# Patient Record
Sex: Female | Born: 1985
Health system: Southern US, Community
[De-identification: ages and names within clinical notes are randomized; demographics above are authoritative.]

## PROBLEM LIST (undated history)

## (undated) ENCOUNTER — Inpatient Hospital Stay (HOSPITAL_COMMUNITY): Payer: Self-pay

## (undated) DIAGNOSIS — L309 Dermatitis, unspecified: Secondary | ICD-10-CM

## (undated) DIAGNOSIS — M419 Scoliosis, unspecified: Secondary | ICD-10-CM

## (undated) DIAGNOSIS — K589 Irritable bowel syndrome without diarrhea: Secondary | ICD-10-CM

## (undated) DIAGNOSIS — Z91030 Bee allergy status: Secondary | ICD-10-CM

## (undated) DIAGNOSIS — F419 Anxiety disorder, unspecified: Secondary | ICD-10-CM

## (undated) DIAGNOSIS — K219 Gastro-esophageal reflux disease without esophagitis: Secondary | ICD-10-CM

## (undated) DIAGNOSIS — Z148 Genetic carrier of other disease: Secondary | ICD-10-CM

## (undated) DIAGNOSIS — E7522 Gaucher disease: Secondary | ICD-10-CM

## (undated) DIAGNOSIS — R51 Headache: Secondary | ICD-10-CM

## (undated) HISTORY — DX: Gaucher disease: E75.22

## (undated) HISTORY — DX: Genetic carrier of other disease: Z14.8

## (undated) HISTORY — DX: Headache: R51

## (undated) HISTORY — DX: Irritable bowel syndrome, unspecified: K58.9

## (undated) HISTORY — DX: Gastro-esophageal reflux disease without esophagitis: K21.9

## (undated) HISTORY — DX: Bee allergy status: Z91.030

## (undated) HISTORY — PX: WISDOM TOOTH EXTRACTION: SHX21

## (undated) HISTORY — DX: Scoliosis, unspecified: M41.9

## (undated) HISTORY — DX: Anxiety disorder, unspecified: F41.9

## (undated) HISTORY — PX: GUM SURGERY: SHX658

## (undated) HISTORY — DX: Dermatitis, unspecified: L30.9

---
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
## 2005-11-17 ENCOUNTER — Other Ambulatory Visit: Admission: RE | Admit: 2005-11-17 | Discharge: 2005-11-17 | Payer: Self-pay | Admitting: Obstetrics and Gynecology

## 2007-01-05 ENCOUNTER — Other Ambulatory Visit: Admission: RE | Admit: 2007-01-05 | Discharge: 2007-01-05 | Payer: Self-pay | Admitting: Obstetrics and Gynecology

## 2008-02-25 ENCOUNTER — Other Ambulatory Visit: Admission: RE | Admit: 2008-02-25 | Discharge: 2008-02-25 | Payer: Self-pay | Admitting: Obstetrics and Gynecology

## 2008-04-03 ENCOUNTER — Ambulatory Visit: Payer: Self-pay | Admitting: Obstetrics and Gynecology

## 2008-08-28 ENCOUNTER — Ambulatory Visit: Payer: Self-pay | Admitting: Obstetrics and Gynecology

## 2008-09-07 ENCOUNTER — Ambulatory Visit: Payer: Self-pay | Admitting: Obstetrics and Gynecology

## 2009-04-13 ENCOUNTER — Other Ambulatory Visit: Admission: RE | Admit: 2009-04-13 | Discharge: 2009-04-13 | Payer: Self-pay | Admitting: Obstetrics and Gynecology

## 2009-04-13 ENCOUNTER — Encounter: Payer: Self-pay | Admitting: Obstetrics and Gynecology

## 2009-04-13 ENCOUNTER — Ambulatory Visit: Payer: Self-pay | Admitting: Obstetrics and Gynecology

## 2010-03-20 ENCOUNTER — Ambulatory Visit: Payer: Self-pay | Admitting: Obstetrics and Gynecology

## 2010-04-22 ENCOUNTER — Other Ambulatory Visit: Admission: RE | Admit: 2010-04-22 | Discharge: 2010-04-22 | Payer: Self-pay | Admitting: Obstetrics and Gynecology

## 2010-04-22 ENCOUNTER — Ambulatory Visit: Payer: Self-pay | Admitting: Obstetrics and Gynecology

## 2011-01-07 ENCOUNTER — Telehealth: Payer: Self-pay | Admitting: Emergency Medicine

## 2011-01-07 NOTE — Telephone Encounter (Signed)
Pt called, set up New pt, establish care visit for Monday 01/20/11.  She has ppw from employer that needs to be completed.  She would like to have that ppw completed at appt which may require her to have physical done.  Will that be okay for her first visit.  I explained the usual routine of establishing care, then returning for separate physical appointment, but she states that she needs ppw turned in to her job ASAP.  Please advise.  Thanks!

## 2011-01-08 NOTE — Telephone Encounter (Signed)
Ok for pt to have work ppw.

## 2011-01-08 NOTE — Telephone Encounter (Signed)
I explained to pt to bring ppw with her, will complete at visit.

## 2011-01-20 ENCOUNTER — Ambulatory Visit (INDEPENDENT_AMBULATORY_CARE_PROVIDER_SITE_OTHER): Payer: BC Managed Care – PPO | Admitting: Internal Medicine

## 2011-01-20 ENCOUNTER — Encounter: Payer: Self-pay | Admitting: Internal Medicine

## 2011-01-20 VITALS — BP 118/70 | HR 90 | Temp 96.9°F | Resp 12 | Ht 68.5 in | Wt 152.0 lb

## 2011-01-20 DIAGNOSIS — R51 Headache: Secondary | ICD-10-CM

## 2011-01-20 DIAGNOSIS — M4120 Other idiopathic scoliosis, site unspecified: Secondary | ICD-10-CM

## 2011-01-20 DIAGNOSIS — T63461A Toxic effect of venom of wasps, accidental (unintentional), initial encounter: Secondary | ICD-10-CM

## 2011-01-20 DIAGNOSIS — K589 Irritable bowel syndrome without diarrhea: Secondary | ICD-10-CM

## 2011-01-20 DIAGNOSIS — M419 Scoliosis, unspecified: Secondary | ICD-10-CM

## 2011-01-20 DIAGNOSIS — R519 Headache, unspecified: Secondary | ICD-10-CM | POA: Insufficient documentation

## 2011-01-20 DIAGNOSIS — J40 Bronchitis, not specified as acute or chronic: Secondary | ICD-10-CM

## 2011-01-20 DIAGNOSIS — T6391XA Toxic effect of contact with unspecified venomous animal, accidental (unintentional), initial encounter: Secondary | ICD-10-CM

## 2011-01-20 DIAGNOSIS — K219 Gastro-esophageal reflux disease without esophagitis: Secondary | ICD-10-CM | POA: Insufficient documentation

## 2011-01-20 DIAGNOSIS — Z23 Encounter for immunization: Secondary | ICD-10-CM

## 2011-01-20 DIAGNOSIS — Z91030 Bee allergy status: Secondary | ICD-10-CM

## 2011-01-20 MED ORDER — EPINEPHRINE 0.3 MG/0.3ML IJ DEVI: 0.3000 mg | Freq: Once | INTRAMUSCULAR | Status: DC

## 2011-01-20 MED ORDER — AZITHROMYCIN 250 MG PO TABS: ORAL_TABLET | ORAL | Status: DC

## 2011-01-20 MED ORDER — TETANUS-DIPHTH-ACELL PERTUSSIS 5-2.5-18.5 LF-MCG/0.5 IM SUSP: 0.5000 mL | Freq: Once | INTRAMUSCULAR | Status: DC

## 2011-01-20 NOTE — Patient Instructions (Signed)
Take antibiotic as prescribed.   Delsym for cough  Epi pen as directed for bee sting .  Pack in suitcase for travel  Bring in vaccine record

## 2011-01-20 NOTE — Progress Notes (Signed)
Subjective:    Patient ID: Kelly Kelly Navarro, female    DOB: 1985-07-13, 25 y.o.   MRN: [id number]  HPI  New pt first visit.  Former pt.  Kelly Navarro is doing well.  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    She reports frequent headaches that start at base of neck and move upward to back of head.  Sometimes pain is "all over my head" No FH of migraines, she notes some relation to her menses.  Dr. Oletha Blend gave her an extra estrogen patch to wear at times of period.  Assoicated with nausea, no vomiting, no real aura, no visual or speech changes.  No numbness or motor wweakness.  No photohobia,  She takes Alleve and "tries to sleep it off".  Currently happening 1-2 times per month.    Also for the past 4-5 days starting with a severe sore throat, she now has nasal congestion and dry cough.  No fever, no sob, no chest pain  She has a history of been sting allergy but no epi pen.  I spent 45 minutes with this pt  Allergies  Allergen Reactions  . Bee Other (See Comments)    Bee stings- Tongue swelling  . Amoxicillin Hives  . Penicillins Hives  . Sulfa Antibiotics Hives   Past Medical History  Diagnosis Date  . IBS (irritable bowel syndrome)   . Headache   . Bee sting allergy   . Scoliosis     very mild  . GERD (gastroesophageal reflux disease)    History reviewed. No pertinent past surgical history. History   Social History  . Marital Status: Kelly Navarro    Spouse Name: N/A    Number of Children: N/A  . Years of Education: N/A   Occupational History  . Not on file.   Social History Main Topics  . Smoking status: Never Smoker   . Smokeless tobacco: Never Used  . Alcohol Use: 0.5 oz/week    1 drink(s) per week  . Drug Use: No  . Sexually Active: Yes    Birth Control/ Protection: Pill   Other Topics Concern  . Not on file   Social History Narrative  . No narrative on file   Family History  Problem Relation Age of Onset  . Hypertension  Mother   . Diabetes Maternal Grandfather   . Hypertension Maternal Grandfather   . Cancer Paternal Grandmother     breast  . Cancer Paternal Grandfather     multiple myeloma   Patient Active Problem List  Diagnoses  . IBS (irritable bowel syndrome)  . GERD (gastroesophageal reflux disease)  . Headache  . Bee sting allergy  . Scoliosis       Review of Systems  See hpi     Objective:   Physical Exam  Constitutional: She is oriented to person, place, and time. She appears well-developed and well-nourished.  HENT:  Right Ear: A middle ear effusion is present.  Left Ear: A middle ear effusion is present.  Mouth/Throat: Posterior oropharyngeal erythema present.       Serous effusion  Cardiovascular: Regular rhythm, S1 normal and S2 normal.  Exam reveals no gallop and no friction rub.   No murmur heard. Pulmonary/Chest: Breath sounds normal. She has no wheezes. She has no rhonchi. She has no rales.  Musculoskeletal:       Mild scoliosis on exam  Lymphadenopathy:    She has cervical adenopathy.  Right cervical: Superficial cervical adenopathy present.       Left cervical: Superficial cervical adenopathy present.  Neurological: She is alert and oriented to person, place, and time. She has normal strength and normal reflexes. She displays no tremor. No cranial nerve deficit or sensory deficit. Gait normal.  Reflex Scores:      Tricep reflexes are 2+ on the right side and 2+ on the left side.      Bicep reflexes are 2+ on the right side and 2+ on the left side.      Brachioradialis reflexes are 2+ on the right side and 2+ on the left side.      Patellar reflexes are 2+ on the right side and 2+ on the left side.      Achilles reflexes are 2+ on the right side and 2+ on the left side.      Grossly non focal          Assessment & Plan:  1)  Headache:  Has some features of severe tension headaches along with some features of migraines.  Advised to take Ibuprofen 800 mg  at onset of headache.  Keep a diary and will further assess.  She is not interested in Tryptans for migraines at this time.  Neurological exam is nonfocal - I see no worrisome findings on neurological  2)  Bronchitis  willl give Z pack   OTC delsym for cough  3) Mild Scoliosis.  She is asymptomatic  4)  Bee sting allergy   Will RX Epi pen.  Pt instruected in use and to keep near her at all times when out of doors  5)  H/O   IBS  Inactive now.  Dr. Kinnie Scales is GI

## 2011-01-21 ENCOUNTER — Encounter: Payer: Self-pay | Admitting: Internal Medicine

## 2011-01-21 LAB — LIPID PANEL
Cholesterol: 87 mg/dL (ref 0–200)
VLDL: 24 mg/dL (ref 0–40)

## 2011-01-21 LAB — COMPREHENSIVE METABOLIC PANEL
ALT: 11 U/L (ref 0–35)
CO2: 26 mEq/L (ref 19–32)
Calcium: 9.7 mg/dL (ref 8.4–10.5)
Chloride: 103 mEq/L (ref 96–112)
Potassium: 5 mEq/L (ref 3.5–5.3)
Sodium: 138 mEq/L (ref 135–145)
Total Protein: 7.4 g/dL (ref 6.0–8.3)

## 2011-01-21 LAB — CBC WITH DIFFERENTIAL/PLATELET
Lymphocytes Relative: 27 % (ref 12–46)
Lymphs Abs: 2 10*3/uL (ref 0.7–4.0)
Neutrophils Relative %: 67 % (ref 43–77)
Platelets: 221 10*3/uL (ref 150–400)
RBC: 4.35 MIL/uL (ref 3.87–5.11)
WBC: 7.2 10*3/uL (ref 4.0–10.5)

## 2011-01-30 ENCOUNTER — Encounter: Payer: Self-pay | Admitting: Emergency Medicine

## 2011-04-09 ENCOUNTER — Encounter: Payer: Self-pay | Admitting: Internal Medicine

## 2011-04-09 ENCOUNTER — Ambulatory Visit (INDEPENDENT_AMBULATORY_CARE_PROVIDER_SITE_OTHER): Payer: BC Managed Care – PPO | Admitting: Internal Medicine

## 2011-04-09 VITALS — BP 119/78 | HR 70 | Temp 97.0°F | Resp 12 | Ht 68.5 in | Wt 151.0 lb

## 2011-04-09 DIAGNOSIS — L309 Dermatitis, unspecified: Secondary | ICD-10-CM

## 2011-04-09 DIAGNOSIS — L259 Unspecified contact dermatitis, unspecified cause: Secondary | ICD-10-CM

## 2011-04-09 DIAGNOSIS — B86 Scabies: Secondary | ICD-10-CM

## 2011-04-09 MED ORDER — TRIAMCINOLONE ACETONIDE 0.025 % EX OINT: TOPICAL_OINTMENT | Freq: Two times a day (BID) | CUTANEOUS | Status: DC

## 2011-04-09 MED ORDER — PERMETHRIN 5 % EX CREA: TOPICAL_CREAM | Freq: Once | CUTANEOUS | Status: DC

## 2011-04-09 NOTE — Patient Instructions (Signed)
Use creme as directed.  Repeat in  one week  Follow up as needed

## 2011-04-09 NOTE — Progress Notes (Signed)
Addended by: Chip Boer on: 04/09/2011 05:13 PM   Modules accepted: Orders

## 2011-04-09 NOTE — Progress Notes (Signed)
Subjective:    Patient ID: Kelly Navarro, female    DOB: Aug 03, 1985, 25 y.o.   MRN: [id number]  HPI  Kelly Navarro is here for acute visit.  2 weeks of very itchy red papules on feet toes advancing to hip area and lower back.  Itching at night and am.  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  Also was at [facility].  Boyfriend does not have rash.  No new exposures  Also has eczema and needs refill of TAC compound  Allergies  Allergen Reactions  . Bee Other (See Comments)    Bee stings- Tongue swelling  . Amoxicillin Hives  . Penicillins Hives  . Sulfa Antibiotics Hives   Past Medical History  Diagnosis Date  . IBS (irritable bowel syndrome)   . Headache   . Bee sting allergy   . Scoliosis     very mild  . GERD (gastroesophageal reflux disease)    No past surgical history on file. History   Social History  . Marital Status: Navarro    Spouse Name: N/A    Number of Children: N/A  . Years of Education: N/A   Occupational History  . Not on file.   Social History Main Topics  . Smoking status: Never Smoker   . Smokeless tobacco: Never Used  . Alcohol Use: 0.5 oz/week    1 drink(s) per week  . Drug Use: No  . Sexually Active: Yes    Birth Control/ Protection: Pill   Other Topics Concern  . Not on file   Social History Narrative  . No narrative on file   Family History  Problem Relation Age of Onset  . Hypertension Mother   . Diabetes Maternal Grandfather   . Hypertension Maternal Grandfather   . Cancer Paternal Grandmother     breast  . Cancer Paternal Grandfather     multiple myeloma   Patient Active Problem List  Diagnoses  . IBS (irritable bowel syndrome)  . GERD (gastroesophageal reflux disease)  . Headache  . Bee sting allergy  . Scoliosis   Current Outpatient Prescriptions on File Prior to Visit  Medication Sig Dispense Refill  . EPINEPHrine (EPI-PEN) 0.3 mg/0.3 mL DEVI Inject 0.3 mLs (0.3 mg total) into the muscle once.  1  Device  0  . estradiol (VIVELLE-DOT) 0.075 MG/24HR Place 1 patch onto the skin 2 (two) times a week. Week of period       . norethindrone-ethinyl estradiol (JUNEL FE 1/20) 1-20 MG-MCG per tablet Take 1 tablet by mouth daily.         Current Facility-Administered Medications on File Prior to Visit  Medication Dose Route Frequency Provider Last Rate Last Dose  . TDaP (BOOSTRIX) injection 0.5 mL  0.5 mL Intramuscular Once Levon Hedger, MD          Review of Systems    See HPI Objective:   Physical Exam  Physical Exam  Nursing note and vitals reviewed.  Constitutional: She is oriented to person, place, and time. She appears well-developed and well-nourished.  HENT:  Head: Normocephalic and atraumatic.  Cardiovascular: Normal rate and regular rhythm. Exam reveals no gallop and no friction rub.  No murmur heard.  Pulmonary/Chest: Breath sounds normal. She has no wheezes. She has no rales.  Neurological: She is alert and oriented to person, place, and time.  Skin: Skin is warm and dry.  Multiple red papules feet, toes,  With burrowing tracks.  None look infected or purulent Psychiatric:  She has a normal mood and affect. Her behavior is normal.        Assessment & Plan:  1)  Probable scabies  .  Will Tx  with Elimite  creme and repeat in 1 week.  Instructed in how to care for linens 2)  Eczema  Rx for compounded Triamcinolone creme  Follow up prn

## 2011-04-10 ENCOUNTER — Telehealth: Payer: Self-pay | Admitting: Internal Medicine

## 2011-04-10 NOTE — Telephone Encounter (Signed)
Spoke with Kelly Navarro.  She states she used the cream last night, washed all her bed linens and pajamas in hot water this morning.  She states the she is concerned about 2 things.  1- Does she need to treat her carpet, couch, ect?  If so, how?  She had problem for about 3 weeks before she came in.  2- Her boyfriend spent the night with her for the past two weekends.  He does not have any symptoms of infection, but does he need to be treated prophylactically?  He is a Consulting civil engineer at [employer] and uses their student health center for his care.

## 2011-04-10 NOTE — Telephone Encounter (Signed)
Pt aware DDS recommendations

## 2011-04-10 NOTE — Telephone Encounter (Signed)
Does not need to treat any other furniture or carpet.  Boyfriend does not need to be treated prophylactically.  If he developes itchy rash then treat him

## 2011-04-10 NOTE — Telephone Encounter (Signed)
Pt states she have questions about the prescriptions you all prescribe to her yesterday afternoon. She can be reach at [phone]. She is not available between 1230 pm and 130 pm. She prefer to call after three.   Thanks

## 2011-04-16 ENCOUNTER — Encounter: Payer: Self-pay | Admitting: Gynecology

## 2011-04-24 ENCOUNTER — Encounter: Payer: Self-pay | Admitting: Obstetrics and Gynecology

## 2011-04-24 ENCOUNTER — Other Ambulatory Visit (HOSPITAL_COMMUNITY)
Admission: RE | Admit: 2011-04-24 | Discharge: 2011-04-24 | Disposition: A | Discharge status: Home / Self Care | Payer: BC Managed Care – PPO | Source: Ambulatory Visit | Attending: Obstetrics and Gynecology | Admitting: Obstetrics and Gynecology

## 2011-04-24 ENCOUNTER — Telehealth: Payer: Self-pay | Admitting: Emergency Medicine

## 2011-04-24 ENCOUNTER — Ambulatory Visit (INDEPENDENT_AMBULATORY_CARE_PROVIDER_SITE_OTHER): Payer: BC Managed Care – PPO | Admitting: Obstetrics and Gynecology

## 2011-04-24 DIAGNOSIS — R51 Headache: Secondary | ICD-10-CM

## 2011-04-24 DIAGNOSIS — N899 Noninflammatory disorder of vagina, unspecified: Secondary | ICD-10-CM

## 2011-04-24 DIAGNOSIS — B373 Candidiasis of vulva and vagina: Secondary | ICD-10-CM

## 2011-04-24 DIAGNOSIS — G8929 Other chronic pain: Secondary | ICD-10-CM

## 2011-04-24 DIAGNOSIS — L309 Dermatitis, unspecified: Secondary | ICD-10-CM | POA: Insufficient documentation

## 2011-04-24 DIAGNOSIS — N898 Other specified noninflammatory disorders of vagina: Secondary | ICD-10-CM

## 2011-04-24 DIAGNOSIS — Z113 Encounter for screening for infections with a predominantly sexual mode of transmission: Secondary | ICD-10-CM

## 2011-04-24 DIAGNOSIS — Z01419 Encounter for gynecological examination (general) (routine) without abnormal findings: Secondary | ICD-10-CM | POA: Insufficient documentation

## 2011-04-24 MED ORDER — ESTRADIOL 0.075 MG/24HR TD PTTW: 1.0000 | MEDICATED_PATCH | TRANSDERMAL | Status: DC

## 2011-04-24 MED ORDER — TERCONAZOLE 0.8 % VA CREA: 1.0000 | TOPICAL_CREAM | Freq: Every day | VAGINAL | Status: AC

## 2011-04-24 MED ORDER — NORETHIN ACE-ETH ESTRAD-FE 1-20 MG-MCG PO TABS: 1.0000 | ORAL_TABLET | Freq: Every day | ORAL | Status: DC

## 2011-04-24 NOTE — Telephone Encounter (Signed)
Spoke with Kelly Navarro.  She states that she noticed another bug bite over the weekend on her foot, then a couple more on her ankles earlier in the week.  Today when she got home, she has 5 bites on the back of her leg from her knee to her upper thigh.  She is very concerned about a recurrence of scabies.  She only has a small amount of Elimite cream left.  Would like to  Know what to do.  Spoke with DDS.  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  Also, can use what cream she has on bites to see if it helps.  Bites may be fleas or scabies- unable to tell without being seen.  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  She is agreeable to use cream on bites she has, will try to address issues with her principal

## 2011-04-24 NOTE — Progress Notes (Addendum)
Patient came to see me today for an annual GYN exam. She is doing very well in her birth control pills. She takes them cyclically and has monthly periods. She continues to get headaches and sees a PCP for them. They do not relate to her periods but seem to be sporadic although she does get some with her cycle. She continues to use a Vivelle patch on her placebo week and as help significantly with headaches at that time. She has noticed intermittent vaginal irritation after intercourse that responds to terconazole cream. She is currently being treated for scabies.  Physical examination: Kennon Portela present   HEENT within normal limits. Neck: Thyroid not large. No masses. Supraclavicular nodes: not enlarged. Breasts: Examined in both sitting midline position. No skin changes and no masses. Abdomen: Soft no guarding rebound or masses or hernia. Pelvic: External: Within normal limits. BUS: Within normal limits. Vaginal:within normal limits. Good estrogen effect. No evidence of cystocele rectocele or enterocele. Cervix: clean. Uterus: Normal size and shape. Adnexa: No masses. Rectovaginal exam: Confirmatory and negative. Extremities: Within normal limits. Wet prep positive for yeast.  Assessment: #1. Yeast vaginitis #2. Headaches  Plan: For the moment continue birth control pills cyclically. Continue Vivelle patch on placebo week. If PCP cannot help headaches and there seems to be a cluster during her period she will call and we'll either increase her patch strength or go to continuous birth control pills. She was treated with terconazole 3 cream for her yeast infection. Lab work from her PCP. We also discussed oral rephresh two times a week.

## 2011-05-02 ENCOUNTER — Telehealth: Payer: Self-pay

## 2011-05-02 NOTE — Telephone Encounter (Signed)
(  This is Dr. Verl Dicker patient and he if off today.) Patient was to restart pill pack on Sunday and forgot. She remembered on Monday and took pill immediately and later that day at her regular time she took her Monday pill.  She said the insert said no need to use backup birth control but she wanted to check with doctor. Please advise.

## 2011-05-02 NOTE — Telephone Encounter (Signed)
I think it is always safer to use backup contraception if there is a missed pill. Missing the first pill extends her pill free interval which more readily allows for an escape ovulation.

## 2011-05-02 NOTE — Telephone Encounter (Signed)
Patient advised.

## 2011-06-11 ENCOUNTER — Telehealth: Payer: Self-pay | Admitting: *Deleted

## 2011-06-11 NOTE — Telephone Encounter (Signed)
(  last office visit 04/24/11)Pt called wanting to know if her Rx for vivelle-dot patch 0.075 could be written as directed for apply 1 patch twice weekly? DG originally wrote for 1 patch twice a week, week of her period due to headaches. But rx cost more money when written this way. Please advise if rx can be as directed.

## 2011-06-11 NOTE — Telephone Encounter (Signed)
Yes, Vivelle dot 0.075 patch use as directed. Dispense one box of 8 with 6 refills.

## 2011-06-12 MED ORDER — ESTRADIOL 0.075 MG/24HR TD PTTW: 1.0000 | MEDICATED_PATCH | TRANSDERMAL | Status: DC

## 2011-06-12 NOTE — Telephone Encounter (Signed)
Pt informed with the below note. 

## 2011-08-11 ENCOUNTER — Ambulatory Visit (INDEPENDENT_AMBULATORY_CARE_PROVIDER_SITE_OTHER): Payer: BC Managed Care – PPO | Admitting: Family Medicine

## 2011-08-11 VITALS — BP 116/74 | HR 68 | Temp 97.6°F | Resp 16 | Ht 67.5 in | Wt 154.8 lb

## 2011-08-11 DIAGNOSIS — J069 Acute upper respiratory infection, unspecified: Secondary | ICD-10-CM

## 2011-08-11 DIAGNOSIS — R82998 Other abnormal findings in urine: Secondary | ICD-10-CM

## 2011-08-11 MED ORDER — PREDNISONE 20 MG PO TABS: ORAL_TABLET | ORAL | Status: DC

## 2011-08-11 MED ORDER — FLUTICASONE PROPIONATE 50 MCG/ACT NA SUSP: NASAL | Status: DC

## 2011-08-11 NOTE — Progress Notes (Signed)
  Subjective:    Patient ID: Kelly Navarro, female    DOB: 04-Nov-1985, 26 y.o.   MRN: [id number]  HPI patient has been very congested since Friday. It came on quite acutely. He may have had a little fever but not severely ill. Now she's developed severe stuffiness in the he had, with pressure in her frontal and maxillary sinus regions. Trouble with nasal air movement. Not really blowing a lot of or coughing a lot up, it's just socked in the head. Her right ears have a little bit of pain throat only mild pressure. Has a roommate who has not been sick. She did try some DayQuil and a nighttime ago at that. This did not help a lot. She does have mild dizziness when she bends forward and stands back up   Review of Systems unremarkable. Is on birth control and denies pregnancy.     Objective:   Physical Exam Pleasant alert white female no acute distress at this time except for just an uncomfortable with a stuffy head. Her TMs are normal nose congested no nasal polyps were seen she is tender over the frontal and maxillary sinuses throat was clear neck supple without significant nodes chest clear heart regular      Assessment & Plan:  Severe URI  Note the computer marked uricosuria by error and would not remove it  We'll treat symptomatically. If she starts getting a lot of purulent drainage she is to let me know.

## 2011-08-11 NOTE — Patient Instructions (Signed)

## 2011-08-12 ENCOUNTER — Ambulatory Visit: Payer: BC Managed Care – PPO | Admitting: Internal Medicine

## 2011-09-22 ENCOUNTER — Other Ambulatory Visit: Payer: Self-pay | Admitting: Emergency Medicine

## 2011-09-22 MED ORDER — NONFORMULARY OR COMPOUNDED ITEM: Status: DC

## 2011-09-22 NOTE — Telephone Encounter (Signed)
Refill request received from [facility].  I spoke with pharmacy tech for clarification of ingredients of compounded cream.

## 2011-11-06 ENCOUNTER — Telehealth: Payer: Self-pay | Admitting: *Deleted

## 2011-11-06 DIAGNOSIS — R202 Paresthesia of skin: Secondary | ICD-10-CM

## 2011-11-06 NOTE — Telephone Encounter (Signed)
Addended by: Aura Camps on: 11/06/2011 03:50 PM   Modules accepted: Orders

## 2011-11-06 NOTE — Telephone Encounter (Signed)
Pt informed with the below note, pt will drop off u/a tomorrow.

## 2011-11-06 NOTE — Telephone Encounter (Signed)
OK 

## 2011-11-06 NOTE — Telephone Encounter (Signed)
Pt calling c/o of tingling sensation in her lower abdomen area. Pt would like to drop off a u/a tomorrow during her lunch break, no burning, no frequent urination.  Her cycle started on 11/05/11, she states this is the same feeling from last uti. Okay drop off u/a? Please advise

## 2011-11-07 ENCOUNTER — Telehealth: Payer: Self-pay | Admitting: *Deleted

## 2011-11-07 ENCOUNTER — Other Ambulatory Visit: Payer: BC Managed Care – PPO

## 2011-11-07 DIAGNOSIS — R202 Paresthesia of skin: Secondary | ICD-10-CM

## 2011-11-07 MED ORDER — NITROFURANTOIN MONOHYD MACRO 100 MG PO CAPS: 100.0000 mg | ORAL_CAPSULE | Freq: Two times a day (BID) | ORAL | Status: AC

## 2011-11-07 NOTE — Telephone Encounter (Signed)
Addended by: Aura Camps on: 11/07/2011 04:17 PM   Modules accepted: Orders

## 2011-11-07 NOTE — Telephone Encounter (Signed)
Pt informed with the below note, rx sent to pharmacy. 

## 2011-11-07 NOTE — Telephone Encounter (Signed)
Start patient on Macrobid twice a day with food for 5 days. Have her take 2 doses today. She can also buy Azo-Standard over-the-counter and that'll help until the antibiotics start  to work.

## 2011-11-07 NOTE — Telephone Encounter (Signed)
Pt dropped off u/a today, c/o lower abdomen pressure only and would like to know if there was something she could do about the discomfort?No pain with urination nor burning. Urine culture is still pending. Please advise

## 2011-11-08 LAB — URINALYSIS W MICROSCOPIC + REFLEX CULTURE
Leukocytes, UA: NEGATIVE
Protein, ur: NEGATIVE mg/dL
Squamous Epithelial / LPF: NONE SEEN
Urobilinogen, UA: 0.2 mg/dL (ref 0.0–1.0)

## 2011-11-10 ENCOUNTER — Telehealth: Payer: Self-pay | Admitting: *Deleted

## 2011-11-10 NOTE — Telephone Encounter (Signed)
Patient called  me today and told  me that her abdominal pressure has gone. She only took four Macrodantin because she had extreme nausea and sickness and headaches from them. Other than that she is fine. We will wait and see what her culture shows. Assuming that it is normal and her pressure does not return no further treatment. If her pressure returns and cultures negative she will schedule ultrasound.

## 2011-11-10 NOTE — Telephone Encounter (Signed)
Pt was given Macrobid x 5 days on Friday for abdominal pressure, pt said that medication makes her feel sick, nausea, headaches. Pt would like to know if she should wait until culture results comes back or can she have another Rx? Please advise

## 2011-12-01 ENCOUNTER — Other Ambulatory Visit: Payer: Self-pay | Admitting: *Deleted

## 2011-12-02 ENCOUNTER — Other Ambulatory Visit: Payer: Self-pay | Admitting: *Deleted

## 2011-12-02 MED ORDER — TERCONAZOLE 0.8 % VA CREA: 1.0000 | TOPICAL_CREAM | Freq: Every day | VAGINAL | Status: AC

## 2011-12-02 NOTE — Telephone Encounter (Signed)
Addended by: Aura Camps on: 12/02/2011 04:43 PM   Modules accepted: Orders

## 2011-12-02 NOTE — Telephone Encounter (Signed)
okay

## 2011-12-02 NOTE — Telephone Encounter (Signed)
Left message on pt voicemail this has been done. 

## 2011-12-02 NOTE — Telephone Encounter (Signed)
Pt calling requesting refill on terconazole 3 day cream due to yeast infection, pt said you always give her this medication if needed. Okay to fill?

## 2011-12-04 ENCOUNTER — Ambulatory Visit (INDEPENDENT_AMBULATORY_CARE_PROVIDER_SITE_OTHER): Payer: BC Managed Care – PPO | Admitting: Internal Medicine

## 2011-12-04 ENCOUNTER — Encounter: Payer: Self-pay | Admitting: Internal Medicine

## 2011-12-04 VITALS — BP 106/64 | HR 66 | Temp 97.0°F | Resp 16 | Ht 68.0 in | Wt 156.0 lb

## 2011-12-04 DIAGNOSIS — J329 Chronic sinusitis, unspecified: Secondary | ICD-10-CM

## 2011-12-04 DIAGNOSIS — R059 Cough, unspecified: Secondary | ICD-10-CM

## 2011-12-04 DIAGNOSIS — R05 Cough: Secondary | ICD-10-CM

## 2011-12-04 NOTE — Progress Notes (Signed)
Subjective:    Patient ID: Kelly Navarro, female    DOB: 02-01-1986, 26 y.o.   MRN: [id number]  HPI Kelly Navarro is here for acute visit.   She [facility] and was given Z-pak.  1 week later not feeling much better and was then given Levaquin.  Feeling some better but still congested.  Using Flonas nasal spray    No allergy symptoms  No fever,  Cough improved much less now no chest pain  Allergies  Allergen Reactions  . Nutritional Supplements Other (See Comments)    Bee stings- Tongue swelling  . Amoxicillin Hives  . Macrodantin (Nitrofurantoin Macrocrystal)   . Penicillins Hives  . Sulfa Antibiotics Hives   Past Medical History  Diagnosis Date  . IBS (irritable bowel syndrome)   . Headache   . Bee sting allergy   . Scoliosis     very mild  . GERD (gastroesophageal reflux disease)   . Eczema    Past Surgical History  Procedure Date  . Wisdom tooth extraction   . Gum surgery    History   Social History  . Marital Status: Navarro    Spouse Name: N/A    Number of Children: N/A  . Years of Education: N/A   Occupational History  . Not on file.   Social History Main Topics  . Smoking status: Never Smoker   . Smokeless tobacco: Never Used  . Alcohol Use: 0.5 oz/week    1 drink(s) per week  . Drug Use: No  . Sexually Active: Yes    Birth Control/ Protection: Pill   Other Topics Concern  . Not on file   Social History Narrative  . No narrative on file   Family History  Problem Relation Age of Onset  . Hypertension Mother   . Diabetes Maternal Grandfather   . Hypertension Maternal Grandfather   . Breast cancer Paternal Grandmother   . Cancer Paternal Grandfather     multiple myeloma   Patient Active Problem List  Diagnoses  . IBS (irritable bowel syndrome)  . GERD (gastroesophageal reflux disease)  . Headache  . Bee sting allergy  . Scoliosis  . Eczema   Current Outpatient Prescriptions on File Prior to Visit  Medication Sig  Dispense Refill  . EPINEPHrine (EPI-PEN) 0.3 mg/0.3 mL DEVI Inject 0.3 mLs (0.3 mg total) into the muscle once.  1 Device  0  . estradiol (VIVELLE-DOT) 0.075 MG/24HR Place 1 patch onto the skin 2 (two) times a week.  8 patch  6  . fluticasone (FLONASE) 50 MCG/ACT nasal spray Use 2 sprays each nostril twice daily for 3 days, then once daily  16 g  1  . NONFORMULARY OR COMPOUNDED ITEM Triamcinolone 0.1%/Cetaphil lotion 1:2. Apply to affected area as directed  240 each  1  . norethindrone-ethinyl estradiol (JUNEL FE,GILDESS FE,LOESTRIN FE) 1-20 MG-MCG tablet Take 1 tablet by mouth daily.  3 Package  7  . terconazole (TERAZOL 3) 0.8 % vaginal cream Place 1 applicator vaginally at bedtime.  20 g  0   Current Facility-Administered Medications on File Prior to Visit  Medication Dose Route Frequency Provider Last Rate Last Dose  . TDaP (BOOSTRIX) injection 0.5 mL  0.5 mL Intramuscular Once Kendrick Ranch, MD           Review of Systems    see HPI Objective:   Physical Exam Physical Exam  Constitutional: She is oriented to person, place, and time. She  appears well-developed and well-nourished. She is cooperative.  HENT:  Head: Normocephalic and atraumatic.  Right Ear: A middle ear effusion is present.  Left Ear: A middle ear effusion is present.  Nose: Mucosal edema present. Right sinus exhibits maxillary sinus tenderness. Left sinus exhibits maxillary sinus tenderness.  Mouth/Throat: Posterior oropharyngeal erythema present.  Serous effusion bilaterally  Eyes: Conjunctivae and EOM are normal. Pupils are equal, round, and reactive to light.  Neck: Neck supple. Carotid bruit is not present. No mass present.  Cardiovascular: Regular rhythm, normal heart sounds, intact distal pulses and normal pulses. Exam reveals no gallop and no friction rub.  No murmur heard.  Pulmonary/Chest: Breath sounds normal. She has no wheezes. She has no rhonchi. She has no rales.  Neurological: She is alert  and oriented to person, place, and time.  Skin: Skin is warm and dry. No abrasion, no bruising, no ecchymosis and no rash noted. No cyanosis. Nails show no clubbing.  Psychiatric: She has a normal mood and affect. Her speech is normal and behavior is normal.             Assessment & Plan:  Res olving sinusitis   Advised to use Afrin nasal spray next 4 days  Cough improving

## 2011-12-04 NOTE — Patient Instructions (Signed)
Use Afrin as directed.

## 2011-12-24 ENCOUNTER — Other Ambulatory Visit: Payer: Self-pay | Admitting: *Deleted

## 2011-12-24 MED ORDER — TERCONAZOLE 0.8 % VA CREA: 1.0000 | TOPICAL_CREAM | Freq: Every day | VAGINAL | Status: AC

## 2012-02-18 ENCOUNTER — Encounter: Payer: Self-pay | Admitting: Internal Medicine

## 2012-02-18 ENCOUNTER — Ambulatory Visit (INDEPENDENT_AMBULATORY_CARE_PROVIDER_SITE_OTHER): Payer: BC Managed Care – PPO | Admitting: Internal Medicine

## 2012-02-18 VITALS — BP 107/73 | HR 78 | Temp 97.6°F | Resp 16 | Ht 68.0 in | Wt 159.0 lb

## 2012-02-18 DIAGNOSIS — M545 Low back pain, unspecified: Secondary | ICD-10-CM

## 2012-02-18 DIAGNOSIS — M461 Sacroiliitis, not elsewhere classified: Secondary | ICD-10-CM

## 2012-02-18 LAB — POCT URINALYSIS DIPSTICK
Bilirubin, UA: NEGATIVE
Glucose, UA: NEGATIVE
Nitrite, UA: NEGATIVE
Urobilinogen, UA: NEGATIVE

## 2012-02-18 MED ORDER — PREDNISONE 20 MG PO TABS: ORAL_TABLET | ORAL | Status: DC

## 2012-02-18 MED ORDER — CYCLOBENZAPRINE HCL 10 MG PO TABS: ORAL_TABLET | ORAL | Status: DC

## 2012-02-18 NOTE — Progress Notes (Signed)
Subjective:    Patient ID: Kelly Navarro, female    DOB: 04-16-1986, 26 y.o.   MRN: [id number]  HPI Kelly Navarro is here with acute visit.  She has been having R sided sacral pain that radiaties to R hip and groin area.  First began when she ran a 17 K with the [place] running [employer].  She has been actively running 2-3 times a week trying to prepare for a 5 K in October.  No recent injury or trauma.    Has been having headaches near her menstrual cycle.  Associated with photophobia, phonophobia and nausea.  Goes to a dark rooom to "sleep it off"  No FH of migraines  Allergies  Allergen Reactions  . Nutritional Supplements Other (See Comments)    Bee stings- Tongue swelling  . Amoxicillin Hives  . Macrodantin (Nitrofurantoin Macrocrystal)   . Penicillins Hives  . Sulfa Antibiotics Hives   Past Medical History  Diagnosis Date  . IBS (irritable bowel syndrome)   . Headache   . Bee sting allergy   . Scoliosis     very mild  . GERD (gastroesophageal reflux disease)   . Eczema    Past Surgical History  Procedure Date  . Wisdom tooth extraction   . Gum surgery    History   Social History  . Marital Status: Navarro    Spouse Name: N/A    Number of Children: N/A  . Years of Education: N/A   Occupational History  . Not on file.   Social History Main Topics  . Smoking status: Never Smoker   . Smokeless tobacco: Never Used  . Alcohol Use: 0.5 oz/week    1 drink(s) per week  . Drug Use: No  . Sexually Active: Yes    Birth Control/ Protection: Pill   Other Topics Concern  . Not on file   Social History Narrative  . No narrative on file   Family History  Problem Relation Age of Onset  . Hypertension Mother   . Diabetes Maternal Grandfather   . Hypertension Maternal Grandfather   . Breast cancer Paternal Grandmother   . Cancer Paternal Grandfather     multiple myeloma   Patient Active Problem List  Diagnosis  . IBS (irritable bowel syndrome)  . GERD (gastroesophageal reflux  disease)  . Headache  . Bee sting allergy  . Scoliosis  . Eczema  . Sinusitis  . Sacroiliitis   Current Outpatient Prescriptions on File Prior to Visit  Medication Sig Dispense Refill  . EPINEPHrine (EPI-PEN) 0.3 mg/0.3 mL DEVI Inject 0.3 mLs (0.3 mg total) into the muscle once.  1 Device  0  . estradiol (VIVELLE-DOT) 0.075 MG/24HR Place 1 patch onto the skin 2 (two) times a week.  8 patch  6  . fluticasone (FLONASE) 50 MCG/ACT nasal spray Use 2 sprays each nostril twice daily for 3 days, then once daily  16 g  1  . NONFORMULARY OR COMPOUNDED ITEM Triamcinolone 0.1%/Cetaphil lotion 1:2. Apply to affected area as directed  240 each  1  . norethindrone-ethinyl estradiol (JUNEL FE,GILDESS FE,LOESTRIN FE) 1-20 MG-MCG tablet Take 1 tablet by mouth daily.  3 Package  7   Current Facility-Administered Medications on File Prior to Visit  Medication Dose Route Frequency Provider Last Rate Last Dose  . TDaP (BOOSTRIX) injection 0.5 mL  0.5 mL Intramuscular Once Kendrick Ranch, MD           Review of Systems see HPI   Objective:  Physical Exam  Physical Exam  Nursing note and vitals reviewed.  Constitutional: She is oriented to person, place, and time. She appears well-developed and well-nourished.  HENT:  Head: Normocephalic and atraumatic.  Cardiovascular: Normal rate and regular rhythm. Exam reveals no gallop and no friction rub.  No murmur heard.  Pulmonary/Chest: Breath sounds normal. She has no wheezes. She has no rales.  Neurological: She is alert and oriented to person, place, and time.  Skin: Skin is warm and dry.  Psychiatric: She has a normal mood and affect. Her behavior is normal.  Back M/S  Neurologic  SI joint tender to palpation  Pos.    Sign on R  Reflexes 2+ symmetric LE  Motor 5/5 LE  SLR neg bilaterally            Assessment & Plan:  Probable sacroiliitis.  Will give slow taper Prednisone.  40 mg taper q 4 days.  Hold any Nsaid while taking  prednisone.  Flexeril bid prn at hs.    Refer to PT.  See me in 12-14 days.  Stop running for now.  OK for uppper body exercises or to walk.  Headache  May be menstrual migraines.  She does not wish prescribed meds but will discuss next visit. Counseled to keep headache diary next few days.  Proteinuria  See U/A  Prior urine tests neg for protein.  May be related to long distance running.  Will recheck    Return 12-14 days

## 2012-02-18 NOTE — Patient Instructions (Addendum)
See me 12-14 days  No running for now

## 2012-03-08 ENCOUNTER — Telehealth: Payer: Self-pay | Admitting: Internal Medicine

## 2012-03-08 NOTE — Telephone Encounter (Signed)
Pt would like to have the nurse or doctor to call her... Her physical therapy appointment is on Thursday 9/12 with [facility] in [place] in the morning... She wants to know if she should just wait to come in to be seen at one time; it will be for the follow up and results of the initial physical therapy appointment... Please call pt at [phone]... Thanks

## 2012-03-10 ENCOUNTER — Ambulatory Visit: Payer: BC Managed Care – PPO | Admitting: Internal Medicine

## 2012-03-11 ENCOUNTER — Ambulatory Visit: Payer: BC Managed Care – PPO | Attending: Internal Medicine | Admitting: Physical Therapy

## 2012-03-11 DIAGNOSIS — R293 Abnormal posture: Secondary | ICD-10-CM | POA: Insufficient documentation

## 2012-03-11 DIAGNOSIS — IMO0001 Reserved for inherently not codable concepts without codable children: Secondary | ICD-10-CM | POA: Insufficient documentation

## 2012-03-11 DIAGNOSIS — M2550 Pain in unspecified joint: Secondary | ICD-10-CM | POA: Insufficient documentation

## 2012-03-15 ENCOUNTER — Ambulatory Visit: Payer: BC Managed Care – PPO | Admitting: Rehabilitation

## 2012-03-17 ENCOUNTER — Ambulatory Visit: Payer: BC Managed Care – PPO | Admitting: Physical Therapy

## 2012-03-22 ENCOUNTER — Ambulatory Visit: Payer: BC Managed Care – PPO | Admitting: Physical Therapy

## 2012-03-25 ENCOUNTER — Ambulatory Visit: Payer: BC Managed Care – PPO | Admitting: Physical Therapy

## 2012-03-29 ENCOUNTER — Encounter: Payer: BC Managed Care – PPO | Admitting: Physical Therapy

## 2012-03-31 ENCOUNTER — Ambulatory Visit (INDEPENDENT_AMBULATORY_CARE_PROVIDER_SITE_OTHER): Payer: BC Managed Care – PPO | Admitting: Internal Medicine

## 2012-03-31 ENCOUNTER — Ambulatory Visit (HOSPITAL_BASED_OUTPATIENT_CLINIC_OR_DEPARTMENT_OTHER)
Admission: RE | Admit: 2012-03-31 | Discharge: 2012-03-31 | Disposition: A | Discharge status: Home / Self Care | Payer: BC Managed Care – PPO | Source: Ambulatory Visit | Attending: Internal Medicine | Admitting: Internal Medicine

## 2012-03-31 ENCOUNTER — Encounter: Payer: Self-pay | Admitting: Internal Medicine

## 2012-03-31 VITALS — BP 105/69 | HR 80 | Temp 97.4°F | Resp 16 | Wt 157.0 lb

## 2012-03-31 DIAGNOSIS — M25551 Pain in right hip: Secondary | ICD-10-CM

## 2012-03-31 DIAGNOSIS — M25559 Pain in unspecified hip: Secondary | ICD-10-CM | POA: Insufficient documentation

## 2012-03-31 NOTE — Progress Notes (Signed)
Subjective:    Patient ID: Kelly Navarro, female    DOB: Aug 02, 1985, 26 y.o.   MRN: [id number]  HPI [name] is here for follow up.  She states she thinks the prednisone helped and she was on Elliptical yesterday and could tolerate a shot period and has not needed any NSAID.  Still feels some pain in hip off and on.    Did not think PT helped much    Allergies  Allergen Reactions  . Nutritional Supplements Other (See Comments)    Bee stings- Tongue swelling  . Amoxicillin Hives  . Macrodantin (Nitrofurantoin Macrocrystal)   . Penicillins Hives  . Sulfa Antibiotics Hives   Past Medical History  Diagnosis Date  . IBS (irritable bowel syndrome)   . Headache   . Bee sting allergy   . Scoliosis     very mild  . GERD (gastroesophageal reflux disease)   . Eczema    Past Surgical History  Procedure Date  . Wisdom tooth extraction   . Gum surgery    History   Social History  . Marital Status: Navarro    Spouse Name: N/A    Number of Children: N/A  . Years of Education: N/A   Occupational History  . Not on file.   Social History Main Topics  . Smoking status: Never Smoker   . Smokeless tobacco: Never Used  . Alcohol Use: 0.5 oz/week    1 drink(s) per week  . Drug Use: No  . Sexually Active: Yes    Birth Control/ Protection: Pill   Other Topics Concern  . Not on file   Social History Narrative  . No narrative on file   Family History  Problem Relation Age of Onset  . Hypertension Mother   . Diabetes Maternal Grandfather   . Hypertension Maternal Grandfather   . Breast cancer Paternal Grandmother   . Cancer Paternal Grandfather     multiple myeloma   Patient Active Problem List  Diagnosis  . IBS (irritable bowel syndrome)  . GERD (gastroesophageal reflux disease)  . Headache  . Bee sting allergy  . Scoliosis  . Eczema  . Sinusitis  . Sacroiliitis   Current Outpatient Prescriptions on File Prior to Visit  Medication Sig Dispense Refill  . ibuprofen  (ADVIL,MOTRIN) 200 MG tablet Take 200 mg by mouth as needed.      . NONFORMULARY OR COMPOUNDED ITEM Triamcinolone 0.1%/Cetaphil lotion 1:2. Apply to affected area as directed  240 each  1  . norethindrone-ethinyl estradiol (JUNEL FE,GILDESS FE,LOESTRIN FE) 1-20 MG-MCG tablet Take 1 tablet by mouth daily.  3 Package  7  . cyclobenzaprine (FLEXERIL) 10 MG tablet Take one tablet twice a day as needed for muscle spasm  6 tablet  0  . EPINEPHrine (EPI-PEN) 0.3 mg/0.3 mL DEVI Inject 0.3 mLs (0.3 mg total) into the muscle once.  1 Device  0  . estradiol (VIVELLE-DOT) 0.075 MG/24HR Place 1 patch onto the skin 2 (two) times a week.  8 patch  6  . fluticasone (FLONASE) 50 MCG/ACT nasal spray Use 2 sprays each nostril twice daily for 3 days, then once daily  16 g  1   Current Facility-Administered Medications on File Prior to Visit  Medication Dose Route Frequency Provider Last Rate Last Dose  . TDaP (BOOSTRIX) injection 0.5 mL  0.5 mL Intramuscular Once Kendrick Ranch, MD           Review of Systems    see HPI Objective:  Physical Exam Physical Exam  Nursing note and vitals reviewed.  Constitutional: She is oriented to person, place, and time. She appears well-developed and well-nourished.  HENT:  Head: Normocephalic and atraumatic.  Cardiovascular: Normal rate and regular rhythm. Exam reveals no gallop and no friction rub.  No murmur heard.  Pulmonary/Chest: Breath sounds normal. She has no wheezes. She has no rales.  Neurological: She is alert and oriented to person, place, and time.  Skin: Skin is warm and dry.  Psychiatric: She has a normal mood and affect. Her behavior is normal.  M/S  Good ROm  R hip no pain today  Minor pain SI joint             Assessment & Plan:  R hip pain  Will get xray today  OK to increase exercise slowly  If not betteer will refer to [facility]

## 2012-03-31 NOTE — Patient Instructions (Signed)
To Xray today  Call me tommorrow

## 2012-04-01 ENCOUNTER — Encounter: Payer: BC Managed Care – PPO | Admitting: Physical Therapy

## 2012-04-06 ENCOUNTER — Encounter: Payer: BC Managed Care – PPO | Admitting: Physical Therapy

## 2012-04-08 ENCOUNTER — Encounter: Payer: BC Managed Care – PPO | Admitting: Physical Therapy

## 2012-04-13 ENCOUNTER — Encounter: Payer: BC Managed Care – PPO | Admitting: Physical Therapy

## 2012-04-15 ENCOUNTER — Encounter: Payer: BC Managed Care – PPO | Admitting: Physical Therapy

## 2012-04-20 ENCOUNTER — Encounter: Payer: BC Managed Care – PPO | Admitting: Physical Therapy

## 2012-04-22 ENCOUNTER — Encounter: Payer: BC Managed Care – PPO | Admitting: Physical Therapy

## 2012-04-27 ENCOUNTER — Encounter: Payer: Self-pay | Admitting: Obstetrics and Gynecology

## 2012-04-27 ENCOUNTER — Ambulatory Visit (INDEPENDENT_AMBULATORY_CARE_PROVIDER_SITE_OTHER): Payer: BC Managed Care – PPO | Admitting: Obstetrics and Gynecology

## 2012-04-27 VITALS — BP 112/64 | Ht 68.0 in | Wt 158.0 lb

## 2012-04-27 DIAGNOSIS — Z23 Encounter for immunization: Secondary | ICD-10-CM

## 2012-04-27 DIAGNOSIS — L293 Anogenital pruritus, unspecified: Secondary | ICD-10-CM

## 2012-04-27 DIAGNOSIS — N898 Other specified noninflammatory disorders of vagina: Secondary | ICD-10-CM

## 2012-04-27 DIAGNOSIS — Z01419 Encounter for gynecological examination (general) (routine) without abnormal findings: Secondary | ICD-10-CM

## 2012-04-27 MED ORDER — NORETHIN ACE-ETH ESTRAD-FE 1-20 MG-MCG PO TABS: 1.0000 | ORAL_TABLET | Freq: Every day | ORAL | Status: DC

## 2012-04-27 MED ORDER — TERCONAZOLE 0.8 % VA CREA: 1.0000 | TOPICAL_CREAM | Freq: Every day | VAGINAL | Status: DC

## 2012-04-27 NOTE — Progress Notes (Signed)
Patient came to see me today for her annual GYN exam. She remains on oral contraceptives for birth control. She does well on them. She does get migraine headaches. For a while we used an estrogen patch on her pill free week. She did not think it made a difference. She does say however that a lot of the headaches are not that week. She is keeping a diary of the headaches for her PCP. She does her lab from her PCP. She has never had an abnormal Pap smear. Her last Pap smear was 2012. She is living with her significant other. She was tested for [facility] and Chlamydia last year and she is still with him. She declined STD testing.  Physical examination:Kim Julian Reil present. HEENT within normal limits. Neck: Thyroid not large. No masses. Supraclavicular nodes: not enlarged. Breasts: Examined in both sitting and lying  position. No skin changes and no masses. Abdomen: Soft no guarding rebound or masses or hernia. Pelvic: External: Within normal limits. BUS: Within normal limits. Vaginal:within normal limits. Good estrogen effect. No evidence of cystocele rectocele or enterocele. Cervix: clean. Uterus: Normal size and shape. Adnexa: No masses. Rectovaginal exam: Confirmatory and negative. Extremities: Within normal limits.  Assessment: Normal GYN exam  Plan: Continue oral contraceptives. Pap not done.The new Pap smear guidelines were discussed with the patient.

## 2012-04-27 NOTE — Patient Instructions (Addendum)
Continue oral contraceptives.

## 2012-04-28 LAB — URINALYSIS W MICROSCOPIC + REFLEX CULTURE
Nitrite: NEGATIVE
Protein, ur: NEGATIVE mg/dL
Specific Gravity, Urine: 1.008 (ref 1.005–1.030)
Squamous Epithelial / LPF: NONE SEEN
Urobilinogen, UA: 0.2 mg/dL (ref 0.0–1.0)

## 2012-04-29 ENCOUNTER — Encounter: Payer: BC Managed Care – PPO | Admitting: Obstetrics and Gynecology

## 2012-04-29 LAB — URINE CULTURE

## 2012-05-24 ENCOUNTER — Other Ambulatory Visit: Payer: Self-pay | Admitting: *Deleted

## 2012-05-24 MED ORDER — NONFORMULARY OR COMPOUNDED ITEM: Status: DC

## 2012-05-24 NOTE — Telephone Encounter (Signed)
Needs refill

## 2012-07-01 ENCOUNTER — Encounter: Payer: Self-pay | Admitting: Internal Medicine

## 2012-07-01 ENCOUNTER — Ambulatory Visit (INDEPENDENT_AMBULATORY_CARE_PROVIDER_SITE_OTHER): Payer: BC Managed Care – PPO | Admitting: Internal Medicine

## 2012-07-01 VITALS — BP 104/68 | HR 57 | Temp 96.8°F | Resp 16 | Wt 155.0 lb

## 2012-07-01 DIAGNOSIS — R635 Abnormal weight gain: Secondary | ICD-10-CM

## 2012-07-01 DIAGNOSIS — R51 Headache: Secondary | ICD-10-CM

## 2012-07-01 MED ORDER — SUMATRIPTAN SUCCINATE 50 MG PO TABS: ORAL_TABLET | ORAL | Status: DC

## 2012-07-01 NOTE — Progress Notes (Signed)
Subjective:    Patient ID: Kelly Navarro, female    DOB: 17-Nov-1985, 27 y.o.   MRN: [id number]  HPI [name] is here for acute visit.  She has been trying to lose weight and is going to weight watchers.  She was wondering if her birth control pill was contributing to weight gain.  She denies swelling in fingers or lower extremities.    She also continues to have headache.  Occurs perimenstrually and sometimes in mid month.  Has phonophobia  , no photophobia A/w nausea no vomiting.  No visual changes.  No FH of migraine.  No numbness or paresthesias   Allergies  Allergen Reactions  . Nutritional Supplements Other (See Comments)    Bee stings- Tongue swelling  . Amoxicillin Hives  . Macrodantin (Nitrofurantoin Macrocrystal) Nausea And Vomiting  . Penicillins Hives  . Sulfa Antibiotics Hives   Past Medical History  Diagnosis Date  . IBS (irritable bowel syndrome)   . Headache   . Bee sting allergy   . Scoliosis     very mild  . GERD (gastroesophageal reflux disease)   . Eczema    Past Surgical History  Procedure Date  . Wisdom tooth extraction   . Gum surgery    History   Social History  . Marital Status: Navarro    Spouse Name: N/A    Number of Children: N/A  . Years of Education: N/A   Occupational History  . Not on file.   Social History Main Topics  . Smoking status: Never Smoker   . Smokeless tobacco: Never Used  . Alcohol Use: 0.5 oz/week    1 drink(s) per week  . Drug Use: No  . Sexually Active: Yes    Birth Control/ Protection: Pill   Other Topics Concern  . Not on file   Social History Narrative  . No narrative on file   Family History  Problem Relation Age of Onset  . Hypertension Mother   . Diabetes Maternal Grandfather   . Hypertension Maternal Grandfather   . Breast cancer Paternal Grandmother     Age 19  . Cancer Paternal Grandfather     multiple myeloma   Patient Active Problem List  Diagnosis  . IBS (irritable bowel syndrome)  . GERD  (gastroesophageal reflux disease)  . Headache  . Bee sting allergy  . Scoliosis  . Eczema  . Sinusitis  . Sacroiliitis   Current Outpatient Prescriptions on File Prior to Visit  Medication Sig Dispense Refill  . fluticasone (FLONASE) 50 MCG/ACT nasal spray Use 2 sprays each nostril twice daily for 3 days, then once daily  16 g  1  . ibuprofen (ADVIL,MOTRIN) 200 MG tablet Take 200 mg by mouth as needed.      . NONFORMULARY OR COMPOUNDED ITEM Triamcinolone 0.1%/Cetaphil lotion 1:2. Apply to affected area as directed  240 each  1  . norethindrone-ethinyl estradiol (JUNEL FE,GILDESS FE,LOESTRIN FE) 1-20 MG-MCG tablet Take 1 tablet by mouth daily.  3 Package  6  . EPINEPHrine (EPI-PEN) 0.3 mg/0.3 mL DEVI Inject 0.3 mLs (0.3 mg total) into the muscle once.  1 Device  0  . estradiol (VIVELLE-DOT) 0.075 MG/24HR Place 1 patch onto the skin 2 (two) times a week.  8 patch  6  . terconazole (TERAZOL 3) 0.8 % vaginal cream Place 1 applicator vaginally at bedtime.  20 g  8   Current Facility-Administered Medications on File Prior to Visit  Medication Dose Route Frequency Provider Last Rate Last Dose  .  TDaP (BOOSTRIX) injection 0.5 mL  0.5 mL Intramuscular Once Kendrick Ranch, MD           Review of Systems See  HPI    Objective:   Physical Exam  Physical Exam  Nursing note and vitals reviewed.  Constitutional: She is oriented to person, place, and time. She appears well-developed and well-nourished.  HENT:  Head: Normocephalic and atraumatic.  Cardiovascular: Normal rate and regular rhythm. Exam reveals no gallop and no friction rub.  No murmur heard.  Pulmonary/Chest: Breath sounds normal. She has no wheezes. She has no rales.  Neurological: She is alert and oriented to person, place, and time.  Skin: Skin is warm and dry.  Psychiatric: She has a normal mood and affect. Her behavior is normal.        Assessment & Plan:  Headache: clinically seems consistent with migraine  syndrome.  Will try Imitrex 50 mg at onset of headache and may repeat in 2hours one time only  Weight gain  Ok to continue current OC  See me as needed

## 2012-09-15 ENCOUNTER — Other Ambulatory Visit: Payer: Self-pay | Admitting: *Deleted

## 2012-09-15 MED ORDER — NONFORMULARY OR COMPOUNDED ITEM: Status: DC

## 2012-09-15 NOTE — Telephone Encounter (Signed)
Refill request

## 2012-09-29 ENCOUNTER — Telehealth: Payer: Self-pay | Admitting: *Deleted

## 2012-09-29 ENCOUNTER — Ambulatory Visit: Payer: BC Managed Care – PPO | Admitting: Internal Medicine

## 2012-09-29 NOTE — Telephone Encounter (Signed)
Pt calls with a C/O SOB productive cough and fever requesting earliest appt available for today made an appt for 430 this Pm pt request that we call if a cancellation opens up advised pt that if her sx worsens before her appt to go to UC. Pt upset that I suggested this states that is why I have a doctor. Explained to pt that we do not want her illness to worsen and that it is in her best interest to seek medical attention if she worsens. Pt requested that I call Dr Constance Goltz to see if she can make an appt for her because she knows her. Explained that we will call if an earlier appt opens up.

## 2012-10-11 ENCOUNTER — Ambulatory Visit: Payer: BC Managed Care – PPO | Admitting: Internal Medicine

## 2012-10-19 ENCOUNTER — Telehealth: Payer: Self-pay | Admitting: Internal Medicine

## 2012-10-19 NOTE — Telephone Encounter (Signed)
Pt called stating prescribed Z pack for sinus infection.  Took Z pack prescribed 2 weeks ago.  Would like to know if she needs to take Z pack prescribed?

## 2012-11-04 ENCOUNTER — Ambulatory Visit (INDEPENDENT_AMBULATORY_CARE_PROVIDER_SITE_OTHER): Payer: BC Managed Care – PPO | Admitting: Women's Health

## 2012-11-04 ENCOUNTER — Encounter: Payer: Self-pay | Admitting: Women's Health

## 2012-11-04 DIAGNOSIS — R3915 Urgency of urination: Secondary | ICD-10-CM

## 2012-11-04 DIAGNOSIS — R5381 Other malaise: Secondary | ICD-10-CM

## 2012-11-04 DIAGNOSIS — R5383 Other fatigue: Secondary | ICD-10-CM

## 2012-11-04 LAB — URINALYSIS W MICROSCOPIC + REFLEX CULTURE
Bilirubin Urine: NEGATIVE
Crystals: NONE SEEN
Ketones, ur: NEGATIVE mg/dL
Nitrite: NEGATIVE
Protein, ur: NEGATIVE mg/dL
Specific Gravity, Urine: 1.01 (ref 1.005–1.030)
Urobilinogen, UA: 0.2 mg/dL (ref 0.0–1.0)

## 2012-11-04 LAB — CBC WITH DIFFERENTIAL/PLATELET
HCT: 39.4 % (ref 36.0–46.0)
Hemoglobin: 13 g/dL (ref 12.0–15.0)
Lymphs Abs: 2.1 10*3/uL (ref 0.7–4.0)
Monocytes Absolute: 0.4 10*3/uL (ref 0.1–1.0)
Monocytes Relative: 5 % (ref 3–12)
Neutro Abs: 4.1 10*3/uL (ref 1.7–7.7)
Neutrophils Relative %: 61 % (ref 43–77)
RBC: 4.4 MIL/uL (ref 3.87–5.11)

## 2012-11-04 NOTE — Progress Notes (Signed)
Patient ID: Kelly Navarro, female   DOB: Dec 15, 1985, 27 y.o.   MRN: [id number] Presents with several complaints. States has been having some urinary frequency with urgency, denies pain at end of stream of urination. Currently on her menstrual cycle. On Loestrin 1/20 without missed pills. Same partner for years. Also states has had some dizziness with nausea without vomiting. States has just not been feeling well for the last couple of days. 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 Also training for half marathon, ran a 10 K last week. Has lost 10 pounds in the last 4 months with diet and exercise. Denies any vaginal discharge or fever.  Exam: Appears well, BP 112/70, UA trace blood, RBCs 0 - 2.   Questionable viral infection Questionable dehydration from exercise  Plan: Urine culture pending, CBC, and increase fluids and rest. Instructed to go home from work today. Instructed to call if no relief of symptoms in 2 days.

## 2012-11-06 LAB — URINE CULTURE
Colony Count: NO GROWTH
Organism ID, Bacteria: NO GROWTH

## 2013-04-08 ENCOUNTER — Ambulatory Visit (INDEPENDENT_AMBULATORY_CARE_PROVIDER_SITE_OTHER): Payer: BC Managed Care – PPO | Admitting: Family Medicine

## 2013-04-08 VITALS — BP 102/74 | HR 71 | Temp 98.3°F | Resp 16 | Ht 68.0 in | Wt 152.8 lb

## 2013-04-08 DIAGNOSIS — R197 Diarrhea, unspecified: Secondary | ICD-10-CM

## 2013-04-08 DIAGNOSIS — R059 Cough, unspecified: Secondary | ICD-10-CM

## 2013-04-08 DIAGNOSIS — R05 Cough: Secondary | ICD-10-CM

## 2013-04-08 DIAGNOSIS — B349 Viral infection, unspecified: Secondary | ICD-10-CM

## 2013-04-08 DIAGNOSIS — B9789 Other viral agents as the cause of diseases classified elsewhere: Secondary | ICD-10-CM

## 2013-04-08 MED ORDER — AZITHROMYCIN 250 MG PO TABS: ORAL_TABLET | ORAL | Status: DC

## 2013-04-08 MED ORDER — BENZONATATE 100 MG PO CAPS: 100.0000 mg | ORAL_CAPSULE | Freq: Three times a day (TID) | ORAL | Status: DC | PRN

## 2013-04-08 NOTE — Progress Notes (Addendum)
Subjective:  This chart was scribed for Shade Flood, MD by Leone Payor, ED Scribe. This patient was seen in room Room 1 and the patient's care was started 11:51 AM.   Patient ID: Kelly Navarro, female    DOB: 03/31/1986, 27 y.o.   MRN: [id number]  HPI  HPI Comments: Kelly Navarro is a 27 y.o. female who presents to [facility] complaining of a gradual onset, constant, gradually worsening cough that began 6 days ago. Pt states she initially had some rhinorrhea as well. She reports having her flu vaccine 3 days ago. She reports feeling increased fatigue 2 days ago and noticed the cough worsened and became productive of dark green sputum. She also began to notice associated chest congestion and SOB with exertion as well. She reports having 1 episode of diarrhea two days ago and again this morning. She states having slight decreased appetite this week. She has been taking delsym at night and Claritin with no relief. Pt states she works as a Engineer, site with sick contacts. She denies fever, abdominal pain, emesis.    Past Medical History  Diagnosis Date  . IBS (irritable bowel syndrome)   . Headache(784.0)   . Bee sting allergy   . Scoliosis     very mild  . GERD (gastroesophageal reflux disease)   . Eczema    Past Surgical History  Procedure Laterality Date  . Wisdom tooth extraction    . Gum surgery     Allergies  Allergen Reactions  . Nutritional Supplements Other (See Comments)    Bee stings- Tongue swelling  . Amoxicillin Hives  . Macrodantin [Nitrofurantoin Macrocrystal] Nausea And Vomiting  . Penicillins Hives  . Sulfa Antibiotics Hives   Prior to Admission medications   Medication Sig Start Date End Date Taking? Authorizing Provider  EPINEPHrine (EPI-PEN) 0.3 mg/0.3 mL DEVI Inject 0.3 mLs (0.3 mg total) into the muscle once. 01/20/11  Yes Kendrick Ranch, MD  ibuprofen (ADVIL,MOTRIN) 200 MG tablet Take 200 mg by mouth as needed.   Yes Historical Provider, MD  NONFORMULARY  OR COMPOUNDED ITEM Triamcinolone 0.1%/Cetaphil lotion 1:2. Apply to affected area as directed 09/15/12  Yes Kendrick Ranch, MD  norethindrone-ethinyl estradiol (JUNEL FE,GILDESS FE,LOESTRIN FE) 1-20 MG-MCG tablet Take 1 tablet by mouth daily. 04/27/12  Yes Trellis Paganini, MD  terconazole (TERAZOL 3) 0.8 % vaginal cream Place 1 applicator vaginally at bedtime. 04/27/12  Yes Trellis Paganini, MD  estradiol (VIVELLE-DOT) 0.075 MG/24HR Place 1 patch onto the skin 2 (two) times a week. 06/12/11 06/11/12  Harrington Challenger, NP  fluticasone Aleda Grana) 50 MCG/ACT nasal spray Use 2 sprays each nostril twice daily for 3 days, then once daily 08/11/11   Peyton Najjar, MD  SUMAtriptan (IMITREX) 50 MG tablet Take one tablet at onset of headache.  May repeat one time only  in 2 hours 07/01/12   Kendrick Ranch, MD   History   Social History  . Marital Status: Navarro    Spouse Name: N/A    Number of Children: N/A  . Years of Education: N/A   Occupational History  . Not on file.   Social History Main Topics  . Smoking status: Never Smoker   . Smokeless tobacco: Never Used  . Alcohol Use: 0.5 oz/week    1 drink(s) per week  . Drug Use: No  . Sexual Activity: Yes    Birth Control/ Protection: Pill   Other Topics Concern  . Not on file  Social History Narrative  . No narrative on file      Review of Systems  Constitutional: Positive for fatigue. Negative for fever.  Respiratory: Positive for cough (productive of green sputum) and shortness of breath (with exertion).   Gastrointestinal: Positive for diarrhea. Negative for nausea, vomiting and abdominal pain.       Objective:   Physical Exam  Nursing note and vitals reviewed. Constitutional: She is oriented to person, place, and time. She appears well-developed and well-nourished. No distress.  HENT:  Head: Normocephalic and atraumatic.  Right Ear: Hearing, tympanic membrane, external ear and ear canal normal.  Left Ear:  Hearing, tympanic membrane, external ear and ear canal normal.  Nose: Nose normal.  Mouth/Throat: Oropharynx is clear and moist. No oropharyngeal exudate.  Eyes: Conjunctivae and EOM are normal. Pupils are equal, round, and reactive to light.  Cardiovascular: Normal rate, regular rhythm, normal heart sounds and intact distal pulses.   No murmur heard. Pulmonary/Chest: Effort normal and breath sounds normal. No respiratory distress. She has no wheezes. She has no rhonchi.  Abdominal: Soft. Bowel sounds are normal. She exhibits no distension. There is no tenderness. There is no rebound and no guarding.  Neurological: She is alert and oriented to person, place, and time.  Skin: Skin is warm and dry. No rash noted.  Psychiatric: She has a normal mood and affect. Her behavior is normal.        Assessment & Plan:  Kelly Navarro is a 27 y.o. female Cough - Plan: benzonatate (TESSALON PERLES) 100 MG capsule, azithromycin (ZITHROMAX) 250 MG tablet  Viral illness  Diarrhea   Suspected viral uri/cough, with few episodes of diarrhea. Reassuring exam, lungs clear, pulse ox wnl.  Continued sx care with mucinex, fluids, rest at present, rx tessalon if needed (can call if stronger suppressant needed at night).  If cough not improved in 3-4 days, can start Zpak, but rtc if fever, or other worsening as deferred CBC and CXR today.   Meds ordered this encounter  Medications  . benzonatate (TESSALON PERLES) 100 MG capsule    Sig: Take 1 capsule (100 mg total) by mouth 3 (three) times daily as needed for cough.    Dispense:  20 capsule    Refill:  0  . azithromycin (ZITHROMAX) 250 MG tablet    Sig: Take 2 pills by mouth on day 1, then 1 pill by mouth per day on days 2 through 5.    Dispense:  6 each    Refill:  0   Patient Instructions  Start mucinex or mucinex DM for cough during the day or Tessalon perles. Drink plenty of fluids. Rest as able.  If cough not improving in next 3-4 days, can start  antibiotic, but if fever, more shortness of breath, or worsening - return for recheck . Viral Infections A virus is a type of germ. Viruses can cause:  Minor sore throats.  Aches and pains.  Headaches.  Runny nose.  Rashes.  Watery eyes.  Tiredness.  Coughs.  Loss of appetite.  Feeling sick to your stomach (nausea).  Throwing up (vomiting).  Watery poop (diarrhea). HOME CARE   Only take medicines as told by your doctor.  Drink enough water and fluids to keep your pee (urine) clear or pale yellow. Sports drinks are a good choice.  Get plenty of rest and eat healthy. Soups and broths with crackers or rice are fine. GET HELP RIGHT AWAY IF:   You have a very bad  headache.  You have shortness of breath.  You have chest pain or neck pain.  You have an unusual rash.  You cannot stop throwing up.  You have watery poop that does not stop.  You cannot keep fluids down.  You or your child has a temperature by mouth above 102 F (38.9 C), not controlled by medicine.  Your baby is older than 3 months with a rectal temperature of 102 F (38.9 C) or higher.  Your baby is 84 months old or younger with a rectal temperature of 100.4 F (38 C) or higher. MAKE SURE YOU:   Understand these instructions.  Will watch this condition.  Will get help right away if you are not doing well or get worse. Document Released: 05/29/2008 Document Revised: 09/08/2011 Document Reviewed: 10/22/2010 [facility] Patient Information 2014 [place], [place].   Cough, Adult  A cough is a reflex that helps clear your throat and airways. It can help heal the body or may be a reaction to an irritated airway. A cough may only last 2 or 3 weeks (acute) or may last more than 8 weeks (chronic).  CAUSES Acute cough:  Viral or bacterial infections. Chronic cough:  Infections.  Allergies.  Asthma.  Post-nasal drip.  Smoking.  Heartburn or acid reflux.  Some medicines.  Chronic lung  problems (COPD).  Cancer. SYMPTOMS   Cough.  Fever.  Chest pain.  Increased breathing rate.  High-pitched whistling sound when breathing (wheezing).  Colored mucus that you cough up (sputum). TREATMENT   A bacterial cough may be treated with antibiotic medicine.  A viral cough must run its course and will not respond to antibiotics.  Your caregiver may recommend other treatments if you have a chronic cough. HOME CARE INSTRUCTIONS   Only take over-the-counter or prescription medicines for pain, discomfort, or fever as directed by your caregiver. Use cough suppressants only as directed by your caregiver.  Use a cold steam vaporizer or humidifier in your bedroom or home to help loosen secretions.  Sleep in a semi-upright position if your cough is worse at night.  Rest as needed.  Stop smoking if you smoke. SEEK IMMEDIATE MEDICAL CARE IF:   You have pus in your sputum.  Your cough starts to worsen.  You cannot control your cough with suppressants and are losing sleep.  You begin coughing up blood.  You have difficulty breathing.  You develop pain which is getting worse or is uncontrolled with medicine.  You have a fever. MAKE SURE YOU:   Understand these instructions.  Will watch your condition.  Will get help right away if you are not doing well or get worse. Document Released: 12/13/2010 Document Revised: 09/08/2011 Document Reviewed: 12/13/2010 [facility] Patient Information 2014 [place], [place].      I personally performed the services described in this documentation, which was scribed in my presence. The recorded information has been reviewed and considered, and addended by me as needed.

## 2013-04-08 NOTE — Patient Instructions (Signed)
Start mucinex or mucinex DM for cough during the day or Tessalon perles. Drink plenty of fluids. Rest as able.  If cough not improving in next 3-4 days, can start antibiotic, but if fever, more shortness of breath, or worsening - return for recheck . Viral Infections A virus is a type of germ. Viruses can cause:  Minor sore throats.  Aches and pains.  Headaches.  Runny nose.  Rashes.  Watery eyes.  Tiredness.  Coughs.  Loss of appetite.  Feeling sick to your stomach (nausea).  Throwing up (vomiting).  Watery poop (diarrhea). HOME CARE   Only take medicines as told by your doctor.  Drink enough water and fluids to keep your pee (urine) clear or pale yellow. Sports drinks are a good choice.  Get plenty of rest and eat healthy. Soups and broths with crackers or rice are fine. GET HELP RIGHT AWAY IF:   You have a very bad headache.  You have shortness of breath.  You have chest pain or neck pain.  You have an unusual rash.  You cannot stop throwing up.  You have watery poop that does not stop.  You cannot keep fluids down.  You or your child has a temperature by mouth above 102 F (38.9 C), not controlled by medicine.  Your baby is older than 3 months with a rectal temperature of 102 F (38.9 C) or higher.  Your baby is 8 months old or younger with a rectal temperature of 100.4 F (38 C) or higher. MAKE SURE YOU:   Understand these instructions.  Will watch this condition.  Will get help right away if you are not doing well or get worse. Document Released: 05/29/2008 Document Revised: 09/08/2011 Document Reviewed: 10/22/2010 [facility] Patient Information 2014 [place], [place].   Cough, Adult  A cough is a reflex that helps clear your throat and airways. It can help heal the body or may be a reaction to an irritated airway. A cough may only last 2 or 3 weeks (acute) or may last more than 8 weeks (chronic).  CAUSES Acute cough:  Viral or bacterial  infections. Chronic cough:  Infections.  Allergies.  Asthma.  Post-nasal drip.  Smoking.  Heartburn or acid reflux.  Some medicines.  Chronic lung problems (COPD).  Cancer. SYMPTOMS   Cough.  Fever.  Chest pain.  Increased breathing rate.  High-pitched whistling sound when breathing (wheezing).  Colored mucus that you cough up (sputum). TREATMENT   A bacterial cough may be treated with antibiotic medicine.  A viral cough must run its course and will not respond to antibiotics.  Your caregiver may recommend other treatments if you have a chronic cough. HOME CARE INSTRUCTIONS   Only take over-the-counter or prescription medicines for pain, discomfort, or fever as directed by your caregiver. Use cough suppressants only as directed by your caregiver.  Use a cold steam vaporizer or humidifier in your bedroom or home to help loosen secretions.  Sleep in a semi-upright position if your cough is worse at night.  Rest as needed.  Stop smoking if you smoke. SEEK IMMEDIATE MEDICAL CARE IF:   You have pus in your sputum.  Your cough starts to worsen.  You cannot control your cough with suppressants and are losing sleep.  You begin coughing up blood.  You have difficulty breathing.  You develop pain which is getting worse or is uncontrolled with medicine.  You have a fever. MAKE SURE YOU:   Understand these instructions.  Will watch  your condition.  Will get help right away if you are not doing well or get worse. Document Released: 12/13/2010 Document Revised: 09/08/2011 Document Reviewed: 12/13/2010 [facility] Patient Information 2014 [place], [place].

## 2013-05-04 ENCOUNTER — Telehealth: Payer: Self-pay | Admitting: *Deleted

## 2013-05-04 ENCOUNTER — Other Ambulatory Visit: Payer: Self-pay | Admitting: Gynecology

## 2013-05-04 MED ORDER — TERCONAZOLE 0.8 % VA CREA: 1.0000 | TOPICAL_CREAM | Freq: Every day | VAGINAL | Status: DC

## 2013-05-04 NOTE — Telephone Encounter (Signed)
Terazol 3 day cream, 1 applicator nightly x3

## 2013-05-04 NOTE — Telephone Encounter (Signed)
rx sent, pt informed.  

## 2013-05-04 NOTE — Telephone Encounter (Signed)
Pt calling c/o yeast infection, itching, white discharge, pt has annual scheduled on 05/16/13 with you. Pt is requesting Terazol 3 day cream which has been given in past. Please advise

## 2013-05-16 ENCOUNTER — Ambulatory Visit (INDEPENDENT_AMBULATORY_CARE_PROVIDER_SITE_OTHER): Payer: BC Managed Care – PPO | Admitting: Gynecology

## 2013-05-16 ENCOUNTER — Encounter: Payer: Self-pay | Admitting: Gynecology

## 2013-05-16 ENCOUNTER — Telehealth: Payer: Self-pay | Admitting: *Deleted

## 2013-05-16 VITALS — BP 110/60 | Ht 68.0 in | Wt 155.0 lb

## 2013-05-16 DIAGNOSIS — Z309 Encounter for contraceptive management, unspecified: Secondary | ICD-10-CM

## 2013-05-16 DIAGNOSIS — Z01419 Encounter for gynecological examination (general) (routine) without abnormal findings: Secondary | ICD-10-CM

## 2013-05-16 LAB — COMPREHENSIVE METABOLIC PANEL
AST: 17 U/L (ref 0–37)
Albumin: 4.1 g/dL (ref 3.5–5.2)
Alkaline Phosphatase: 48 U/L (ref 39–117)
BUN: 9 mg/dL (ref 6–23)
Chloride: 101 mEq/L (ref 96–112)
Creat: 0.65 mg/dL (ref 0.50–1.10)
Potassium: 3.8 mEq/L (ref 3.5–5.3)
Sodium: 136 mEq/L (ref 135–145)
Total Bilirubin: 0.3 mg/dL (ref 0.3–1.2)
Total Protein: 7.7 g/dL (ref 6.0–8.3)

## 2013-05-16 LAB — LIPID PANEL
Cholesterol: 98 mg/dL (ref 0–200)
LDL Cholesterol: 32 mg/dL (ref 0–99)
Triglycerides: 131 mg/dL (ref <150)
VLDL: 26 mg/dL (ref 0–40)

## 2013-05-16 LAB — CBC WITH DIFFERENTIAL/PLATELET
Basophils Absolute: 0 10*3/uL (ref 0.0–0.1)
Basophils Relative: 1 % (ref 0–1)
Eosinophils Absolute: 0.1 10*3/uL (ref 0.0–0.7)
Eosinophils Relative: 1 % (ref 0–5)
HCT: 36.1 % (ref 36.0–46.0)
Hemoglobin: 12.2 g/dL (ref 12.0–15.0)
Lymphocytes Relative: 42 % (ref 12–46)
Lymphs Abs: 2.8 10*3/uL (ref 0.7–4.0)
MCH: 29.5 pg (ref 26.0–34.0)
MCHC: 33.8 g/dL (ref 30.0–36.0)
MCV: 87.2 fL (ref 78.0–100.0)
Monocytes Absolute: 0.4 10*3/uL (ref 0.1–1.0)
Monocytes Relative: 6 % (ref 3–12)
Neutro Abs: 3.2 10*3/uL (ref 1.7–7.7)
Platelets: 204 10*3/uL (ref 150–400)
RDW: 13.7 % (ref 11.5–15.5)
WBC: 6.5 10*3/uL (ref 4.0–10.5)

## 2013-05-16 LAB — TSH: TSH: 1.289 u[IU]/mL (ref 0.350–4.500)

## 2013-05-16 MED ORDER — DROSPIRENONE-ETHINYL ESTRADIOL 3-0.02 MG PO TABS: 1.0000 | ORAL_TABLET | Freq: Every day | ORAL | Status: DC

## 2013-05-16 MED ORDER — FLUCONAZOLE 150 MG PO TABS: 150.0000 mg | ORAL_TABLET | Freq: Once | ORAL | Status: DC

## 2013-05-16 NOTE — Telephone Encounter (Signed)
Correct Diflucan 150 mg tablets #8 one by mouth when necessary yeast infection

## 2013-05-16 NOTE — Telephone Encounter (Signed)
Pharmacy called to clarify the diflucan 150 # 8  that was sent. Should Rx be diflucan 150 take 1 tablet as needed for yeast infections?

## 2013-05-16 NOTE — Telephone Encounter (Signed)
[place] informed with this.

## 2013-05-16 NOTE — Progress Notes (Signed)
This is well below 27 years if Kelly Navarro 1985/07/19 [id number]        27 y.o.  G0P0 for annual exam.  Former patient of Dr. Eda Paschal. Several issues noted below.  Past medical history,surgical history, problem list, medications, allergies, family history and social history were all reviewed and documented in the EPIC chart.  ROS:  Performed and pertinent positives and negatives are included in the history, assessment and plan .  Exam: Kim assistant Filed Vitals:   05/16/13 1512  BP: 110/60  Height: 5\' 8"  (1.727 m)  Weight: 155 lb (70.308 kg)   General appearance  Normal Skin grossly normal Head/Neck normal with no cervical or supraclavicular adenopathy thyroid normal Lungs  clear Cardiac RR, without RMG Abdominal  soft, nontender, without masses, organomegaly or hernia Breasts  examined lying and sitting without masses, retractions, discharge or axillary adenopathy. Pelvic  Ext/BUS/vagina  normal  Cervix  normal  Uterus  anteverted, normal size, shape and contour, midline and mobile nontender   Adnexa  Without masses or tenderness    Anus and perineum  normal    Assessment/Plan:  27 y.o. G0P0 female for annual exam.   1. Contraceptive management. Patient currently on Loestrin 120 equivalents. Has issues with pill free week headaches. Had tried estrogen patch previously with Dr. Eda Paschal but did not think it helped. Not having headaches other times. No aura. Slight increased risk of stroke associated with migraine type headaches and birth control pills reviewed with the patient.  Options to include continuous pills with every other to every third month withdrawal. Adding estrogen during the pill free week like estradiol 1 mg also reviewed. After lengthy discussion and in conjunction with #2 below we are going to try Yaz equivalent with an every other to every third month withdrawal and see how she does. Slight increased risk of thrombosis associated with drospirenone pills  discussed. Otherwise healthy does not smoke the patient understands and accepts. 2. Weight changes. Patient is having fluctuations in her weight and wondered if it was birth control pill related. Has been on birth control pills since age 27. Options to stop the pills or switch to another method such as Mirena IUD discussed. Patient's going to try the Yaz and see how she does. She'll call me in several months in followup. Check TSH today. 3. Pap smear 2012 normal. No Pap smear done today. No history of abnormal Pap smears previously. Plan repeat Pap smear next year a 3 year interval. 4. Breast self. SBE monthly reviewed. 5. Health maintenance. Baseline CBC comprehensive metabolic panel lipid profile urinalysis TSH ordered. Followup with results to the new birth control pills otherwise annually.   Note: This document was prepared with digital dictation and possible smart phrase technology. Any transcriptional errors that result from this process are unintentional.   Dara Lords MD, 3:48 PM 05/16/2013

## 2013-05-16 NOTE — Patient Instructions (Addendum)
Switch to the new Yaz birth control pills. Call me if you have any questions or issues. Followup in one year for annual exam.

## 2013-05-17 LAB — URINALYSIS W MICROSCOPIC + REFLEX CULTURE
Bilirubin Urine: NEGATIVE
Casts: NONE SEEN
Crystals: NONE SEEN
Glucose, UA: NEGATIVE mg/dL
Hgb urine dipstick: NEGATIVE
Ketones, ur: NEGATIVE mg/dL
Nitrite: NEGATIVE
Protein, ur: NEGATIVE mg/dL
Specific Gravity, Urine: 1.007 (ref 1.005–1.030)
Squamous Epithelial / LPF: NONE SEEN
Urobilinogen, UA: 0.2 mg/dL (ref 0.0–1.0)
pH: 6.5 (ref 5.0–8.0)

## 2013-07-20 ENCOUNTER — Telehealth: Payer: Self-pay | Admitting: *Deleted

## 2013-07-20 NOTE — Telephone Encounter (Signed)
Pt is currently taking generic yaz, pt said on Saturday she had a stomach virus and had some vomiting about 30 minutes after taking her birth control pill. Pt isn't sure if pill come up as well, asked if she should maybe use condoms for 1 month? Pt is engaged, this was the week 1 of active pill. Pt wasn't sure if any extra precautions should take place. Please advise

## 2013-07-20 NOTE — Telephone Encounter (Signed)
Should definitely use backup contraception for this pack, some would suggest even next pack.

## 2013-07-20 NOTE — Telephone Encounter (Signed)
Pt informed with the below note. 

## 2013-07-27 ENCOUNTER — Encounter: Payer: Self-pay | Admitting: Internal Medicine

## 2013-07-27 ENCOUNTER — Ambulatory Visit (INDEPENDENT_AMBULATORY_CARE_PROVIDER_SITE_OTHER): Payer: BC Managed Care – PPO | Admitting: Internal Medicine

## 2013-07-27 VITALS — BP 116/82 | HR 75 | Temp 97.9°F | Resp 18 | Wt 167.0 lb

## 2013-07-27 DIAGNOSIS — J101 Influenza due to other identified influenza virus with other respiratory manifestations: Secondary | ICD-10-CM

## 2013-07-27 DIAGNOSIS — R05 Cough: Secondary | ICD-10-CM

## 2013-07-27 DIAGNOSIS — R059 Cough, unspecified: Secondary | ICD-10-CM

## 2013-07-27 DIAGNOSIS — R6889 Other general symptoms and signs: Secondary | ICD-10-CM

## 2013-07-27 DIAGNOSIS — R0981 Nasal congestion: Secondary | ICD-10-CM

## 2013-07-27 DIAGNOSIS — J111 Influenza due to unidentified influenza virus with other respiratory manifestations: Secondary | ICD-10-CM

## 2013-07-27 DIAGNOSIS — J3489 Other specified disorders of nose and nasal sinuses: Secondary | ICD-10-CM

## 2013-07-27 LAB — POCT INFLUENZA A/B: Influenza A, POC: POSITIVE

## 2013-07-27 MED ORDER — OSELTAMIVIR PHOSPHATE 75 MG PO CAPS
75.0000 mg | ORAL_CAPSULE | Freq: Two times a day (BID) | ORAL | Status: DC
Start: 2013-07-27 — End: 2013-08-31

## 2013-07-27 NOTE — Patient Instructions (Signed)
Take Tamiflu if not better     Get AFrin nasal spray  (generic OK )  Use as directed

## 2013-07-27 NOTE — Progress Notes (Signed)
Subjective:    Patient ID: Kelly Navarro, female    DOB: 02/23/1986, 28 y.o.   MRN: [id number]  HPI  Kelly Navarro is here for acute visit  .  Febrile resp illness over weekend.  T100.8 at home.  Better now but cough and  Nasal congestion    Works with children    Allergies  Allergen Reactions  . Nutritional Supplements Other (See Comments)    Bee stings- Tongue swelling  . Amoxicillin Hives  . Macrodantin [Nitrofurantoin Macrocrystal] Nausea And Vomiting  . Penicillins Hives  . Sulfa Antibiotics Hives   Past Medical History  Diagnosis Date  . IBS (irritable bowel syndrome)   . Headache(784.0)   . Bee sting allergy   . Scoliosis     very mild  . GERD (gastroesophageal reflux disease)   . Eczema    Past Surgical History  Procedure Laterality Date  . Wisdom tooth extraction    . Gum surgery     History   Social History  . Marital Status: Single    Spouse Name: N/A    Number of Children: N/A  . Years of Education: N/A   Occupational History  . Not on file.   Social History Main Topics  . Smoking status: Never Smoker   . Smokeless tobacco: Never Used  . Alcohol Use: 0.5 oz/week    1 drink(s) per week  . Drug Use: No  . Sexual Activity: Yes    Birth Control/ Protection: Pill   Other Topics Concern  . Not on file   Social History Narrative  . No narrative on file   Family History  Problem Relation Age of Onset  . Hypertension Mother   . Diabetes Maternal Grandfather   . Hypertension Maternal Grandfather   . Breast cancer Paternal Grandmother     Age 80  . Cancer Paternal Grandfather     multiple myeloma   Patient Active Problem List   Diagnosis Date Noted  . Weight gain 07/01/2012  . Sacroiliitis 02/18/2012  . Sinusitis 12/04/2011  . Eczema   . IBS (irritable bowel syndrome)   . GERD (gastroesophageal reflux disease)   . Headache   . Bee sting allergy   . Scoliosis    Current Outpatient Prescriptions on File Prior to Visit  Medication Sig  Dispense Refill  . drospirenone-ethinyl estradiol (YAZ,GIANVI,LORYNA) 3-0.02 MG tablet Take 1 tablet by mouth daily.  2 Package  7  . ibuprofen (ADVIL,MOTRIN) 200 MG tablet Take 200 mg by mouth as needed.      Marland Kitchen EPINEPHrine (EPI-PEN) 0.3 mg/0.3 mL DEVI Inject 0.3 mLs (0.3 mg total) into the muscle once.  1 Device  0  . fluticasone (FLONASE) 50 MCG/ACT nasal spray Use 2 sprays each nostril twice daily for 3 days, then once daily  16 g  1   Current Facility-Administered Medications on File Prior to Visit  Medication Dose Route Frequency Provider Last Rate Last Dose  . TDaP (BOOSTRIX) injection 0.5 mL  0.5 mL Intramuscular Once Lanice Shirts, MD          Review of Systems    see HPI Objective:   Physical Exam Physical Exam  Constitutional: She is oriented to person, place, and time. She appears well-developed and well-nourished. She is cooperative.  HENT:  Head: Normocephalic and atraumatic.  Right Ear: A middle ear effusion is present.  Left Ear: A middle ear effusion is present.  Nose: Mucosal edema present. Right sinus exhibits maxillary sinus tenderness. Left sinus  exhibits maxillary sinus tenderness.  Mouth/Throat: Posterior oropharyngeal erythema absent.  Serous effusion bilaterally  Eyes: Conjunctivae and EOM are normal. Pupils are equal, round, and reactive to light.  Neck: Neck supple. Carotid bruit is not present. No mass present.  Cardiovascular: Regular rhythm, normal heart sounds, intact distal pulses and normal pulses. Exam reveals no gallop and no friction rub.  No murmur heard.  Pulmonary/Chest: Breath sounds normal. She has no wheezes. She has no rhonchi. She has no rales.  Neurological: She is alert and oriented to person, place, and time.  Skin: Skin is warm and dry. No abrasion, no bruising, no ecchymosis and no rash noted. No cyanosis. Nails show no clubbing.  Psychiatric: She has a normal mood and affect. Her speech is normal and behavior is normal.              Assessment & Plan:  Influenza  Rapid flu pos in office  tamifly RX given  Cough  She has tessalon perles  Nasal congestion  AFrin  otc

## 2013-08-31 ENCOUNTER — Ambulatory Visit (INDEPENDENT_AMBULATORY_CARE_PROVIDER_SITE_OTHER): Payer: BC Managed Care – PPO | Admitting: Internal Medicine

## 2013-08-31 ENCOUNTER — Encounter: Payer: Self-pay | Admitting: Internal Medicine

## 2013-08-31 VITALS — BP 109/67 | HR 77 | Temp 98.3°F | Resp 18 | Wt 153.0 lb

## 2013-08-31 DIAGNOSIS — J019 Acute sinusitis, unspecified: Secondary | ICD-10-CM

## 2013-08-31 DIAGNOSIS — Z91030 Bee allergy status: Secondary | ICD-10-CM

## 2013-08-31 DIAGNOSIS — R51 Headache: Secondary | ICD-10-CM

## 2013-08-31 MED ORDER — FLUTICASONE PROPIONATE 50 MCG/ACT NA SUSP: NASAL | Status: DC

## 2013-08-31 MED ORDER — EPINEPHRINE 0.3 MG/0.3ML IJ SOAJ: 0.3000 mg | Freq: Once | INTRAMUSCULAR | Status: DC

## 2013-08-31 MED ORDER — AZITHROMYCIN 250 MG PO TABS: ORAL_TABLET | ORAL | Status: DC

## 2013-08-31 NOTE — Progress Notes (Signed)
Subjective:    Patient ID: Kelly Navarro, female    DOB: 02/10/1986, 28 y.o.   MRN: [id number]  HPI [place] is here for acute visit.   Recovered from flu but still has lots of facial pain white nasal discharge.  No fever.  Occasional frontal headache.    No history of allergies  Allergies  Allergen Reactions  . Nutritional Supplements Other (See Comments)    Bee stings- Tongue swelling  . Amoxicillin Hives  . Macrodantin [Nitrofurantoin Macrocrystal] Nausea And Vomiting  . Penicillins Hives  . Sulfa Antibiotics Hives   Past Medical History  Diagnosis Date  . IBS (irritable bowel syndrome)   . Headache(784.0)   . Bee sting allergy   . Scoliosis     very mild  . GERD (gastroesophageal reflux disease)   . Eczema    Past Surgical History  Procedure Laterality Date  . Wisdom tooth extraction    . Gum surgery     History   Social History  . Marital Status: Single    Spouse Name: N/A    Number of Children: N/A  . Years of Education: N/A   Occupational History  . Not on file.   Social History Main Topics  . Smoking status: Never Smoker   . Smokeless tobacco: Never Used  . Alcohol Use: 0.5 oz/week    1 drink(s) per week  . Drug Use: No  . Sexual Activity: Yes    Birth Control/ Protection: Pill   Other Topics Concern  . Not on file   Social History Narrative  . No narrative on file   Family History  Problem Relation Age of Onset  . Hypertension Mother   . Diabetes Maternal Grandfather   . Hypertension Maternal Grandfather   . Breast cancer Paternal Grandmother     Age 25  . Cancer Paternal Grandfather     multiple myeloma   Patient Active Problem List   Diagnosis Date Noted  . Influenza A 07/27/2013  . Weight gain 07/01/2012  . Sacroiliitis 02/18/2012  . Sinusitis 12/04/2011  . Eczema   . IBS (irritable bowel syndrome)   . GERD (gastroesophageal reflux disease)   . Headache   . Bee sting allergy   . Scoliosis    Current Outpatient Prescriptions  on File Prior to Visit  Medication Sig Dispense Refill  . drospirenone-ethinyl estradiol (YAZ,GIANVI,LORYNA) 3-0.02 MG tablet Take 1 tablet by mouth daily.  2 Package  7  . ibuprofen (ADVIL,MOTRIN) 200 MG tablet Take 200 mg by mouth as needed.      . benzonatate (TESSALON) 100 MG capsule Take 100 mg by mouth 3 (three) times daily as needed for cough.       Current Facility-Administered Medications on File Prior to Visit  Medication Dose Route Frequency Provider Last Rate Last Dose  . TDaP (BOOSTRIX) injection 0.5 mL  0.5 mL Intramuscular Once Lanice Shirts, MD           Review of Systems See HPI    Objective:   Physical Exam  Physical Exam  Constitutional: She is oriented to person, place, and time. She appears well-developed and well-nourished. She is cooperative.  HENT:  Head: Normocephalic and atraumatic.  Right Ear: A middle ear effusion is present.  Left Ear: A middle ear effusion is present.  Nose: Mucosal edema present. Right sinus exhibits maxillary sinus tenderness. Left sinus exhibits maxillary sinus tenderness.  Mouth/Throat: Posterior oropharyngeal erythema present.  Serous effusion bilaterally  Eyes: Conjunctivae and EOM  are normal. Pupils are equal, round, and reactive to light.  Neck: Neck supple. Carotid bruit is not present. No mass present.  Cardiovascular: Regular rhythm, normal heart sounds, intact distal pulses and normal pulses. Exam reveals no gallop and no friction rub.  No murmur heard.  Pulmonary/Chest: Breath sounds normal. She has no wheezes. She has no rhonchi. She has no rales.  Neurological: She is alert and oriented to person, place, and time.  Skin: Skin is warm and dry. No abrasion, no bruising, no ecchymosis and no rash noted. No cyanosis. Nails show no clubbing.  Psychiatric: She has a normal mood and affect. Her speech is normal and behavior is normal.              Assessment & Plan:  Sinusitis:  Will give Z-pak  Afrin nasal  spray q12h for 3-4 days  Allergy:  When finished with Z-pak and AFrin  Ok to try Flonase 1 spray daily each nostril    I counseled pt if not better in two weeks she is to call office and she will need a sinus CT.   She voices understanding and states she will call

## 2013-08-31 NOTE — Patient Instructions (Signed)
Call in 2 weeks if not better. 

## 2013-09-20 ENCOUNTER — Other Ambulatory Visit: Payer: Self-pay

## 2013-09-22 ENCOUNTER — Other Ambulatory Visit: Payer: Self-pay | Admitting: Gynecology

## 2013-09-22 NOTE — Telephone Encounter (Signed)
Pt called as well and said unable to come in today, leaving town tomorrow will back on Monday. Asked if you are not willing to give #8 then #1 pill will be fine as well. Please advise

## 2013-09-23 NOTE — Telephone Encounter (Signed)
Pt informed

## 2014-01-27 ENCOUNTER — Ambulatory Visit (INDEPENDENT_AMBULATORY_CARE_PROVIDER_SITE_OTHER): Payer: BC Managed Care – PPO | Admitting: Gynecology

## 2014-01-27 ENCOUNTER — Encounter: Payer: Self-pay | Admitting: Gynecology

## 2014-01-27 DIAGNOSIS — B373 Candidiasis of vulva and vagina: Secondary | ICD-10-CM

## 2014-01-27 DIAGNOSIS — B3731 Acute candidiasis of vulva and vagina: Secondary | ICD-10-CM

## 2014-01-27 DIAGNOSIS — N898 Other specified noninflammatory disorders of vagina: Secondary | ICD-10-CM

## 2014-01-27 LAB — WET PREP FOR TRICH, YEAST, CLUE
Clue Cells Wet Prep HPF POC: NONE SEEN
Trich, Wet Prep: NONE SEEN

## 2014-01-27 MED ORDER — FLUCONAZOLE 150 MG PO TABS: 150.0000 mg | ORAL_TABLET | Freq: Once | ORAL | Status: DC

## 2014-01-27 MED ORDER — IBUPROFEN 800 MG PO TABS: 800.0000 mg | ORAL_TABLET | Freq: Three times a day (TID) | ORAL | Status: DC | PRN

## 2014-01-27 NOTE — Patient Instructions (Signed)
Take Diflucan pill as needed for yeast.

## 2014-01-27 NOTE — Progress Notes (Signed)
Mariana Singlelyssa Demmon 05-21-1986 [id number]        28 y.o.  G0P0 presents with several days of vaginal itching and discharge. Does have a history of yeast in the past. Tends to stay in exercise clothing. No odor. No urinary symptoms such as frequency, dysuria, urgency.  Past medical history,surgical history, problem list, medications, allergies, family history and social history were all reviewed and documented in the EPIC chart.  Directed ROS with pertinent positives and negatives documented in the history of present illness/assessment and plan.  Exam: [facility] assistant General appearance:  Normal External BUS vagina with white discharge. Cervix normal. Uterus normal sized mobile nontender. Adnexa without masses or tenderness.  Assessment/Plan:  28 y.o. G0P0 with:  1. Vaginal discharge. Symptoms, exam and wet prep consistent with yeast vaginitis. Diflucan 150 mg #4 provided. Will take one when necessary for symptoms. 2. Contraceptive management. Patient doing in every other month withdrawal with Yaz BCPs. Notes that her cramping seems worse with the every other month withdrawal versus every month. Options reviewed and ultimately she is going to try a every 3 month withdrawal to space this out. Ibuprofen 800 mg #60 refill x1 provided as needed for discomfort. Followup if her symptoms persist or certainly worsen.   Note: This document was prepared with digital dictation and possible smart phrase technology. Any transcriptional errors that result from this process are unintentional.   Dara LordsFONTAINE,TIMOTHY P MD, 2:34 PM 01/27/2014

## 2014-05-22 ENCOUNTER — Other Ambulatory Visit: Payer: Self-pay | Admitting: Gynecology

## 2014-06-18 ENCOUNTER — Other Ambulatory Visit: Payer: Self-pay | Admitting: Gynecology

## 2014-06-20 ENCOUNTER — Encounter: Payer: BC Managed Care – PPO | Admitting: Gynecology

## 2014-08-10 ENCOUNTER — Encounter: Payer: BC Managed Care – PPO | Admitting: Gynecology

## 2014-08-10 ENCOUNTER — Telehealth: Payer: Self-pay | Admitting: *Deleted

## 2014-08-10 NOTE — Telephone Encounter (Signed)
Okay to refill? 

## 2014-08-10 NOTE — Telephone Encounter (Signed)
Pt had annual scheduled today but canceled due to her cycle starting, now rescheduled on 09/15/14. She asked if birth control pills and Diflucan #8 could be sent to pharmacy. I will send birth controls, okay for pt to have Diflucan #8 states you normally fill that at her yearly? Please advise

## 2014-08-14 ENCOUNTER — Other Ambulatory Visit: Payer: Self-pay | Admitting: Gynecology

## 2014-08-14 MED ORDER — DROSPIRENONE-ETHINYL ESTRADIOL 3-0.02 MG PO TABS: ORAL_TABLET | ORAL | Status: DC

## 2014-08-14 MED ORDER — FLUCONAZOLE 150 MG PO TABS: 150.0000 mg | ORAL_TABLET | Freq: Once | ORAL | Status: DC

## 2014-08-14 NOTE — Telephone Encounter (Signed)
Rx's sent to pharmacy. Patient informed. 

## 2014-09-11 ENCOUNTER — Encounter: Payer: Self-pay | Admitting: Internal Medicine

## 2014-09-11 ENCOUNTER — Ambulatory Visit (INDEPENDENT_AMBULATORY_CARE_PROVIDER_SITE_OTHER): Payer: BC Managed Care – PPO | Admitting: Internal Medicine

## 2014-09-11 VITALS — BP 118/77 | HR 98 | Temp 97.9°F | Resp 16 | Ht 68.0 in | Wt 164.0 lb

## 2014-09-11 DIAGNOSIS — J020 Streptococcal pharyngitis: Secondary | ICD-10-CM

## 2014-09-11 DIAGNOSIS — R070 Pain in throat: Secondary | ICD-10-CM | POA: Diagnosis not present

## 2014-09-11 DIAGNOSIS — R110 Nausea: Secondary | ICD-10-CM

## 2014-09-11 LAB — POCT RAPID STREP A (OFFICE): Rapid Strep A Screen: POSITIVE — AB

## 2014-09-11 MED ORDER — AZITHROMYCIN 250 MG PO TABS: ORAL_TABLET | ORAL | Status: DC

## 2014-09-11 MED ORDER — PROMETHAZINE HCL 25 MG PO TABS: 25.0000 mg | ORAL_TABLET | Freq: Three times a day (TID) | ORAL | Status: DC | PRN

## 2014-09-11 MED ORDER — AZITHROMYCIN 250 MG PO TABS
ORAL_TABLET | ORAL | Status: DC
Start: 2014-09-11 — End: 2014-09-15

## 2014-09-11 MED ORDER — IBUPROFEN 800 MG PO TABS: 800.0000 mg | ORAL_TABLET | Freq: Three times a day (TID) | ORAL | Status: DC | PRN

## 2014-09-11 NOTE — Progress Notes (Signed)
Subjective:    Patient ID: Kelly Navarro, female    DOB: 08/08/1985, 29 y.o.   MRN: [id number]  HPI Several days of fever at home with severe sore throat.    Fever to 99.7 at home but pt taking Tylenol and Alleve  Has some nausea but no diarrhea  Allergies  Allergen Reactions  . Nutritional Supplements Other (See Comments)    Bee stings- Tongue swelling  . Amoxicillin Hives  . Macrodantin [Nitrofurantoin Macrocrystal] Nausea And Vomiting  . Penicillins Hives  . Sulfa Antibiotics Hives   Past Medical History  Diagnosis Date  . IBS (irritable bowel syndrome)   . Headache(784.0)   . Bee sting allergy   . Scoliosis     very mild  . GERD (gastroesophageal reflux disease)   . Eczema    Past Surgical History  Procedure Laterality Date  . Wisdom tooth extraction    . Gum surgery     History   Social History  . Marital Status: Navarro    Spouse Name: N/A  . Number of Children: N/A  . Years of Education: N/A   Occupational History  . Not on file.   Social History Main Topics  . Smoking status: Never Smoker   . Smokeless tobacco: Never Used  . Alcohol Use: 0.5 oz/week    1 drink(s) per week  . Drug Use: No  . Sexual Activity: Yes    Birth Control/ Protection: Pill   Other Topics Concern  . Not on file   Social History Narrative   Family History  Problem Relation Age of Onset  . Hypertension Mother   . Diabetes Maternal Grandfather   . Hypertension Maternal Grandfather   . Breast cancer Paternal Grandmother     Age 30  . Cancer Paternal Grandfather     multiple myeloma   Patient Active Problem List   Diagnosis Date Noted  . Influenza A 07/27/2013  . Weight gain 07/01/2012  . Sacroiliitis 02/18/2012  . Sinusitis 12/04/2011  . Eczema   . IBS (irritable bowel syndrome)   . GERD (gastroesophageal reflux disease)   . Headache   . Bee sting allergy   . Scoliosis    Current Outpatient Prescriptions on File Prior to Visit  Medication Sig Dispense Refill    . drospirenone-ethinyl estradiol (GIANVI) 3-0.02 MG tablet TAKE 1 TABLET ONCE DAILY. (FOR CONTINUOUS USE) 56 tablet 0  . EPINEPHrine (EPI-PEN) 0.3 mg/0.3 mL SOAJ injection Inject 0.3 mLs (0.3 mg total) into the muscle once. 1 Device 1   Current Facility-Administered Medications on File Prior to Visit  Medication Dose Route Frequency Provider Last Rate Last Dose  . TDaP (BOOSTRIX) injection 0.5 mL  0.5 mL Intramuscular Once Kendrick [place], MD           Review of Systems See HPI    Objective:   Physical Exam Physical Exam  Constitutional: She is oriented to person, place, and time. She appears well-developed and well-nourished. She is cooperative.  HENT:  Head: Normocephalic and atraumatic.  Right Ear: A middle ear effusion is present.  Left Ear: A middle ear effusion is present.  Nose: Mucosal edema present.  Mouth/Throat: Oropharyngeal exudate and posterior oropharyngeal erythema present.  Serous effusion bilaterally  Eyes: Conjunctivae and EOM are normal. Pupils are equal, round, and reactive to light.  Neck: Neck supple. Carotid bruit is not present. No mass present.  Cardiovascular: Regular rhythm, normal heart sounds, intact distal pulses and normal pulses. Exam reveals no gallop and  no friction rub.  No murmur heard.  Pulmonary/Chest: Breath sounds normal. She has no wheezes. She has no rhonchi. She has no rales.  Lymphadenopathy:  She has cervical adenopathy.  Neurological: She is alert and oriented to person, place, and time.  Skin: Skin is warm and dry. No abrasion, no bruising, no ecchymosis and no rash noted. No cyanosis. Nails show no clubbing.  Psychiatric: She has a normal mood and affect. Her speech is normal and behavior is normal.            Assessment & Plan:  Strep pharyngitis      Rapid strep positive.   Will treat with Z-pak   Ibuprofen 800 mg  q6h prn  Nausea ok for phenergan

## 2014-09-15 ENCOUNTER — Other Ambulatory Visit (HOSPITAL_COMMUNITY)
Admission: RE | Admit: 2014-09-15 | Discharge: 2014-09-15 | Disposition: A | Discharge status: Home / Self Care | Payer: BC Managed Care – PPO | Source: Ambulatory Visit | Attending: Gynecology | Admitting: Gynecology

## 2014-09-15 ENCOUNTER — Ambulatory Visit (INDEPENDENT_AMBULATORY_CARE_PROVIDER_SITE_OTHER): Payer: BC Managed Care – PPO | Admitting: Gynecology

## 2014-09-15 ENCOUNTER — Encounter: Payer: Self-pay | Admitting: Gynecology

## 2014-09-15 VITALS — BP 120/74 | Ht 68.0 in | Wt 167.0 lb

## 2014-09-15 DIAGNOSIS — Z01419 Encounter for gynecological examination (general) (routine) without abnormal findings: Secondary | ICD-10-CM | POA: Diagnosis present

## 2014-09-15 MED ORDER — DROSPIRENONE-ETHINYL ESTRADIOL 3-0.02 MG PO TABS: ORAL_TABLET | ORAL | Status: DC

## 2014-09-15 MED ORDER — FLUCONAZOLE 150 MG PO TABS: 150.0000 mg | ORAL_TABLET | Freq: Once | ORAL | Status: DC

## 2014-09-15 NOTE — Addendum Note (Signed)
Addended by: Dayna BarkerGARDNER, Vanecia Limpert K on: 09/15/2014 04:18 PM   Modules accepted: Orders

## 2014-09-15 NOTE — Patient Instructions (Signed)
You may obtain a copy of any labs that were done today by logging onto MyChart as outlined in the instructions provided with your AVS (after visit summary). The office will not call with normal lab results but certainly if there are any significant abnormalities then we will contact you.   Health Maintenance, Female A healthy lifestyle and preventative care can promote health and wellness.  Maintain regular health, dental, and eye exams.  Eat a healthy diet. Foods like vegetables, fruits, whole grains, low-fat dairy products, and lean protein foods contain the nutrients you need without too many calories. Decrease your intake of foods high in solid fats, added sugars, and salt. Get information about a proper diet from your caregiver, if necessary.  Regular physical exercise is one of the most important things you can do for your health. Most adults should get at least 150 minutes of moderate-intensity exercise (any activity that increases your heart rate and causes you to sweat) each week. In addition, most adults need muscle-strengthening exercises on 2 or more days a week.   Maintain a healthy weight. The body mass index (BMI) is a screening tool to identify possible weight problems. It provides an estimate of body fat based on height and weight. Your caregiver can help determine your BMI, and can help you achieve or maintain a healthy weight. For adults 20 years and older:  A BMI below 18.5 is considered underweight.  A BMI of 18.5 to 24.9 is normal.  A BMI of 25 to 29.9 is considered overweight.  A BMI of 30 and above is considered obese.  Maintain normal blood lipids and cholesterol by exercising and minimizing your intake of saturated fat. Eat a balanced diet with plenty of fruits and vegetables. Blood tests for lipids and cholesterol should begin at age 61 and be repeated every 5 years. If your lipid or cholesterol levels are high, you are over 50, or you are a high risk for heart  disease, you may need your cholesterol levels checked more frequently.Ongoing high lipid and cholesterol levels should be treated with medicines if diet and exercise are not effective.  If you smoke, find out from your caregiver how to quit. If you do not use tobacco, do not start.  Lung cancer screening is recommended for adults aged 33 80 years who are at high risk for developing lung cancer because of a history of smoking. Yearly low-dose computed tomography (CT) is recommended for people who have at least a 30-pack-year history of smoking and are a current smoker or have quit within the past 15 years. A pack year of smoking is smoking an average of 1 pack of cigarettes a day for 1 year (for example: 1 pack a day for 30 years or 2 packs a day for 15 years). Yearly screening should continue until the smoker has stopped smoking for at least 15 years. Yearly screening should also be stopped for people who develop a health problem that would prevent them from having lung cancer treatment.  If you are pregnant, do not drink alcohol. If you are breastfeeding, be very cautious about drinking alcohol. If you are not pregnant and choose to drink alcohol, do not exceed 1 drink per day. One drink is considered to be 12 ounces (355 mL) of beer, 5 ounces (148 mL) of wine, or 1.5 ounces (44 mL) of liquor.  Avoid use of street drugs. Do not share needles with anyone. Ask for help if you need support or instructions about stopping  the use of drugs.  High blood pressure causes heart disease and increases the risk of stroke. Blood pressure should be checked at least every 1 to 2 years. Ongoing high blood pressure should be treated with medicines, if weight loss and exercise are not effective.  If you are 59 to 29 years old, ask your caregiver if you should take aspirin to prevent strokes.  Diabetes screening involves taking a blood sample to check your fasting blood sugar level. This should be done once every 3  years, after age 91, if you are within normal weight and without risk factors for diabetes. Testing should be considered at a younger age or be carried out more frequently if you are overweight and have at least 1 risk factor for diabetes.  Breast cancer screening is essential preventative care for women. You should practice "breast self-awareness." This means understanding the normal appearance and feel of your breasts and may include breast self-examination. Any changes detected, no matter how small, should be reported to a caregiver. Women in their 66s and 30s should have a clinical breast exam (CBE) by a caregiver as part of a regular health exam every 1 to 3 years. After age 101, women should have a CBE every year. Starting at age 100, women should consider having a mammogram (breast X-ray) every year. Women who have a family history of breast cancer should talk to their caregiver about genetic screening. Women at a high risk of breast cancer should talk to their caregiver about having an MRI and a mammogram every year.  Breast cancer gene (BRCA)-related cancer risk assessment is recommended for women who have family members with BRCA-related cancers. BRCA-related cancers include breast, ovarian, tubal, and peritoneal cancers. Having family members with these cancers may be associated with an increased risk for harmful changes (mutations) in the breast cancer genes BRCA1 and BRCA2. Results of the assessment will determine the need for genetic counseling and BRCA1 and BRCA2 testing.  The Pap test is a screening test for cervical cancer. Women should have a Pap test starting at age 57. Between ages 25 and 35, Pap tests should be repeated every 2 years. Beginning at age 37, you should have a Pap test every 3 years as long as the past 3 Pap tests have been normal. If you had a hysterectomy for a problem that was not cancer or a condition that could lead to cancer, then you no longer need Pap tests. If you are  between ages 50 and 76, and you have had normal Pap tests going back 10 years, you no longer need Pap tests. If you have had past treatment for cervical cancer or a condition that could lead to cancer, you need Pap tests and screening for cancer for at least 20 years after your treatment. If Pap tests have been discontinued, risk factors (such as a new sexual partner) need to be reassessed to determine if screening should be resumed. Some women have medical problems that increase the chance of getting cervical cancer. In these cases, your caregiver may recommend more frequent screening and Pap tests.  The human papillomavirus (HPV) test is an additional test that may be used for cervical cancer screening. The HPV test looks for the virus that can cause the cell changes on the cervix. The cells collected during the Pap test can be tested for HPV. The HPV test could be used to screen women aged 44 years and older, and should be used in women of any age  who have unclear Pap test results. After the age of 30, women should have HPV testing at the same frequency as a Pap test.  Colorectal cancer can be detected and often prevented. Most routine colorectal cancer screening begins at the age of 50 and continues through age 75. However, your caregiver may recommend screening at an earlier age if you have risk factors for colon cancer. On a yearly basis, your caregiver may provide home test kits to check for hidden blood in the stool. Use of a small camera at the end of a tube, to directly examine the colon (sigmoidoscopy or colonoscopy), can detect the earliest forms of colorectal cancer. Talk to your caregiver about this at age 50, when routine screening begins. Direct examination of the colon should be repeated every 5 to 10 years through age 75, unless early forms of pre-cancerous polyps or small growths are found.  Hepatitis C blood testing is recommended for all people born from 1945 through 1965 and any  individual with known risks for hepatitis C.  Practice safe sex. Use condoms and avoid high-risk sexual practices to reduce the spread of sexually transmitted infections (STIs). Sexually active women aged 25 and younger should be checked for Chlamydia, which is a common sexually transmitted infection. Older women with new or multiple partners should also be tested for Chlamydia. Testing for other STIs is recommended if you are sexually active and at increased risk.  Osteoporosis is a disease in which the bones lose minerals and strength with aging. This can result in serious bone fractures. The risk of osteoporosis can be identified using a bone density scan. Women ages 65 and over and women at risk for fractures or osteoporosis should discuss screening with their caregivers. Ask your caregiver whether you should be taking a calcium supplement or vitamin D to reduce the rate of osteoporosis.  Menopause can be associated with physical symptoms and risks. Hormone replacement therapy is available to decrease symptoms and risks. You should talk to your caregiver about whether hormone replacement therapy is right for you.  Use sunscreen. Apply sunscreen liberally and repeatedly throughout the day. You should seek shade when your shadow is shorter than you. Protect yourself by wearing long sleeves, pants, a wide-brimmed hat, and sunglasses year round, whenever you are outdoors.  Notify your caregiver of new moles or changes in moles, especially if there is a change in shape or color. Also notify your caregiver if a mole is larger than the size of a pencil eraser.  Stay current with your immunizations. Document Released: 12/30/2010 Document Revised: 10/11/2012 Document Reviewed: 12/30/2010 [employer] Patient Information 2014 [employer].   

## 2014-09-15 NOTE — Progress Notes (Signed)
Kelly Navarro April 27, 1986 [id number]        29 y.o.  G0P0 for annual exam.  Overall doing well. Several issues noted below.  Past medical history,surgical history, problem list, medications, allergies, family history and social history were all reviewed and documented as reviewed in the EPIC chart.  ROS:  Performed with pertinent positives and negatives included in the history, assessment and plan.   Additional significant findings :  none   Exam: [facility] personassistant Filed Vitals:   09/15/14 1510  BP: 120/74  Height: 5\' 8"  (1.727 m)  Weight: 167 lb (75.751 kg)   General appearance:  Normal affect, orientation and appearance. Skin: Grossly normal HEENT: Without gross lesions.  No cervical or supraclavicular adenopathy. Thyroid normal.  Lungs:  Clear without wheezing, rales or rhonchi Cardiac: RR, without RMG Abdominal:  Soft, nontender, without masses, guarding, rebound, organomegaly or hernia Breasts:  Examined lying and sitting without masses, retractions, discharge or axillary adenopathy. Pelvic:  Ext/BUS/vagina normal  Cervix normal. Pap done  Uterus anteverted, normal size, shape and contour, midline and mobile nontender   Adnexa  Without masses or tenderness    Anus and perineum  Normal    Assessment/Plan:  29 y.o. G0P0 female for annual exam with regular menses, oral contraceptives.   1. Oral contraceptives. Patient continues on Yaz equivalent BCP. She is doing an every other month withdrawal. She does note a headache consistently several days after she starts taking the new pills after a pill free week. Had tried low-dose estrogen during her menses week but did not seem to help with the headache. Takes ibuprofen 800 mg as needed. Options to include changing oral contraceptives to continuing with an every 3-4 month withdrawal reviewed. Patient's comfortable with continuing as she is. I again discussed the increased risk of thrombosis such as stroke heart attack DVT associated with  drospirenone-containing pills and she is comfortable continuing. 2. Vaginal itching. Just finished course of oral antibiotics for strep throat. Notes some itching. No discharge or odor. Will treat with Diflucan 150 mg given history. #5 provided as she does and to have recurrent yeast infections to use when necessary. 3. Breast health. SBE monthly reviewed. 4. Pap smear 2012. Pap smear done today. No history of abnormal Pap smears previously. 5. Health maintenance. Patient is arranging appointment with her primary physician and will have her routine labs done through her office. Follow up in one year, sooner as needed.     Dara LordsFONTAINE,Aneta Hendershott P MD, 3:46 PM 09/15/2014

## 2014-09-16 LAB — URINALYSIS W MICROSCOPIC + REFLEX CULTURE
Bacteria, UA: NONE SEEN
Bilirubin Urine: NEGATIVE
CASTS: NONE SEEN
Crystals: NONE SEEN
Glucose, UA: NEGATIVE mg/dL
Hgb urine dipstick: NEGATIVE
Ketones, ur: NEGATIVE mg/dL
LEUKOCYTES UA: NEGATIVE
NITRITE: NEGATIVE
Protein, ur: NEGATIVE mg/dL
SPECIFIC GRAVITY, URINE: 1.008 (ref 1.005–1.030)
Squamous Epithelial / LPF: NONE SEEN
Urobilinogen, UA: 0.2 mg/dL (ref 0.0–1.0)
pH: 6 (ref 5.0–8.0)

## 2014-09-19 LAB — CYTOLOGY - PAP

## 2014-10-09 ENCOUNTER — Other Ambulatory Visit: Payer: Self-pay | Admitting: *Deleted

## 2014-10-09 DIAGNOSIS — Z0000 Encounter for general adult medical examination without abnormal findings: Secondary | ICD-10-CM

## 2014-11-08 ENCOUNTER — Ambulatory Visit (INDEPENDENT_AMBULATORY_CARE_PROVIDER_SITE_OTHER): Payer: BC Managed Care – PPO | Admitting: Internal Medicine

## 2014-11-08 ENCOUNTER — Encounter: Payer: Self-pay | Admitting: Internal Medicine

## 2014-11-08 VITALS — BP 107/66 | HR 70 | Resp 16 | Ht 68.0 in | Wt 166.0 lb

## 2014-11-08 DIAGNOSIS — G4489 Other headache syndrome: Secondary | ICD-10-CM

## 2014-11-08 DIAGNOSIS — E663 Overweight: Secondary | ICD-10-CM

## 2014-11-08 DIAGNOSIS — Z0000 Encounter for general adult medical examination without abnormal findings: Secondary | ICD-10-CM

## 2014-11-08 NOTE — Progress Notes (Signed)
Subjective:    Patient ID: Kelly Navarro, female    DOB: May 13, 1986, 29 y.o.   MRN: [id number]  HPI  Kelly Navarro is here for CPE  Recently moved to a new house    HM:  Pap per GYN,  Non smoker    Has occasional headaches on Oc's but they are improving.  She will take Ibuprofen prn  Headache always occure peri-mensturally  Frontal occ nausea   "sleeps it off"  Mother had h/o migraines   Concerned over difficulty to lose weight.   Allergies  Allergen Reactions  . Nutritional Supplements Other (See Comments)    Bee stings- Tongue swelling  . Amoxicillin Hives  . Macrodantin [Nitrofurantoin Macrocrystal] Nausea And Vomiting  . Penicillins Hives  . Sulfa Antibiotics Hives   Past Medical History  Diagnosis Date  . IBS (irritable bowel syndrome)   . Headache(784.0)   . Bee sting allergy   . Scoliosis     very mild  . GERD (gastroesophageal reflux disease)   . Eczema    Past Surgical History  Procedure Laterality Date  . Wisdom tooth extraction    . Gum surgery     History   Social History  . Marital Status: Single    Spouse Name: N/A  . Number of Children: N/A  . Years of Education: N/A   Occupational History  . Not on file.   Social History Main Topics  . Smoking status: Never Smoker   . Smokeless tobacco: Never Used  . Alcohol Use: 0.6 oz/week    1 Standard drinks or equivalent per week  . Drug Use: No  . Sexual Activity: Yes    Birth Control/ Protection: Pill     Comment: 1st intercourse 29 yo--Fewer than 5 partners   Other Topics Concern  . Not on file   Social History Narrative   Family History  Problem Relation Age of Onset  . Hypertension Mother   . Diabetes Maternal Grandfather   . Hypertension Maternal Grandfather   . Breast cancer Paternal Grandmother     Age 28  . Cancer Paternal Grandfather     multiple myeloma   Patient Active Problem List   Diagnosis Date Noted  . Influenza A 07/27/2013  . Weight gain 07/01/2012  . Sacroiliitis  02/18/2012  . Sinusitis 12/04/2011  . Eczema   . IBS (irritable bowel syndrome)   . GERD (gastroesophageal reflux disease)   . Headache(784.0)   . Bee sting allergy   . Scoliosis    Current Outpatient Prescriptions on File Prior to Visit  Medication Sig Dispense Refill  . drospirenone-ethinyl estradiol (GIANVI) 3-0.02 MG tablet TAKE 1 TABLET ONCE DAILY. (FOR CONTINUOUS USE) 56 tablet 7  . EPINEPHrine (EPI-PEN) 0.3 mg/0.3 mL SOAJ injection Inject 0.3 mLs (0.3 mg total) into the muscle once. 1 Device 1  . ibuprofen (ADVIL,MOTRIN) 800 MG tablet Take 1 tablet (800 mg total) by mouth every 8 (eight) hours as needed. 20 tablet 0   Current Facility-Administered Medications on File Prior to Visit  Medication Dose Route Frequency Provider Last Rate Last Dose  . TDaP (BOOSTRIX) injection 0.5 mL  0.5 mL Intramuscular Once Lanice Shirts, MD         Review of Systems See HPI    Objective:   Physical Exam Physical Exam  Nursing note and vitals reviewed.  Constitutional: She is oriented to person, place, and time. She appears well-developed and well-nourished.  HENT:  Head: Normocephalic and atraumatic.  Right Ear:  Tympanic membrane and ear canal normal. No drainage. Tympanic membrane is not injected and not erythematous.  Left Ear: Tympanic membrane and ear canal normal. No drainage. Tympanic membrane is not injected and not erythematous.  Nose: Nose normal. Right sinus exhibits no maxillary sinus tenderness and no frontal sinus tenderness. Left sinus exhibits no maxillary sinus tenderness and no frontal sinus tenderness.  Mouth/Throat: Oropharynx is clear and moist. No oral lesions. No oropharyngeal exudate.  Eyes: Conjunctivae and EOM are normal. Pupils are equal, round, and reactive to light.  Neck: Normal range of motion. Neck supple. No JVD present. Carotid bruit is not present. No mass and no thyromegaly present.  Cardiovascular: Normal rate, regular rhythm, S1 normal, S2 normal  and intact distal pulses. Exam reveals no gallop and no friction rub.  No murmur heard.  Pulses:  Carotid pulses are 2+ on the right side, and 2+ on the left side.  Dorsalis pedis pulses are 2+ on the right side, and 2+ on the left side.  No carotid bruit. No LE edema  Pulmonary/Chest: Breath sounds normal. She has no wheezes. She has no rales. She exhibits no tenderness.  Breast no discrete mass no nipple discharge no axillary adenopathy bilaterally Abdominal: Soft. Bowel sounds are normal. She exhibits no distension and no mass. There is no hepatosplenomegaly. There is no tenderness. There is no CVA tenderness.  Musculoskeletal: Normal range of motion.  Mild scoliosis  No active synovitis to joints.  Lymphadenopathy:  She has no cervical adenopathy.  She has no axillary adenopathy.  Right: No inguinal and no supraclavicular adenopathy present.  Left: No inguinal and no supraclavicular adenopathy present.  Neurological: She is alert and oriented to person, place, and time. She has normal strength and normal reflexes. She displays no tremor. No cranial nerve deficit or sensory deficit. Coordination and gait normal.  Skin: Skin is warm and dry. No rash noted. No cyanosis. Nails show no clubbing.  Psychiatric: She has a normal mood and affect. Her speech is normal and behavior is normal. Cognition and memory are normal.        Assessment & Plan:  HM:   See scanned sheet  Non-smoker   UTD with vaccines.   Headache  Ok for Nsaid near menses   Possible menstrual migraine syndrome  Overweight  Given copy of 3 day diet.  Advised to investigate [employer] plan

## 2014-11-09 ENCOUNTER — Encounter: Payer: BC Managed Care – PPO | Admitting: Internal Medicine

## 2014-11-09 ENCOUNTER — Encounter: Payer: Self-pay | Admitting: *Deleted

## 2014-11-09 LAB — CBC WITH DIFFERENTIAL/PLATELET
BASOS PCT: 1 % (ref 0–1)
Basophils Absolute: 0.1 10*3/uL (ref 0.0–0.1)
EOS PCT: 1 % (ref 0–5)
Eosinophils Absolute: 0.1 10*3/uL (ref 0.0–0.7)
HEMATOCRIT: 38.4 % (ref 36.0–46.0)
Hemoglobin: 12.8 g/dL (ref 12.0–15.0)
Lymphocytes Relative: 39 % (ref 12–46)
Lymphs Abs: 3.4 10*3/uL (ref 0.7–4.0)
MCH: 29.3 pg (ref 26.0–34.0)
MCHC: 33.3 g/dL (ref 30.0–36.0)
MCV: 87.9 fL (ref 78.0–100.0)
MONOS PCT: 5 % (ref 3–12)
MPV: 11.1 fL (ref 8.6–12.4)
Monocytes Absolute: 0.4 10*3/uL (ref 0.1–1.0)
Neutro Abs: 4.7 10*3/uL (ref 1.7–7.7)
Neutrophils Relative %: 54 % (ref 43–77)
Platelets: 250 10*3/uL (ref 150–400)
RBC: 4.37 MIL/uL (ref 3.87–5.11)
RDW: 13.9 % (ref 11.5–15.5)
WBC: 8.7 10*3/uL (ref 4.0–10.5)

## 2014-11-09 LAB — COMPLETE METABOLIC PANEL WITH GFR
ALK PHOS: 51 U/L (ref 39–117)
ALT: 13 U/L (ref 0–35)
AST: 14 U/L (ref 0–37)
Albumin: 3.7 g/dL (ref 3.5–5.2)
BILIRUBIN TOTAL: 0.3 mg/dL (ref 0.2–1.2)
BUN: 13 mg/dL (ref 6–23)
CO2: 27 mEq/L (ref 19–32)
Calcium: 9.5 mg/dL (ref 8.4–10.5)
Chloride: 101 mEq/L (ref 96–112)
Creat: 0.76 mg/dL (ref 0.50–1.10)
GFR, Est African American: >89 mL/min
GFR, Est Non African American: >89 mL/min
Glucose, Bld: 85 mg/dL (ref 70–99)
Potassium: 4.1 mEq/L (ref 3.5–5.3)
Sodium: 135 mEq/L (ref 135–145)
TOTAL PROTEIN: 7.5 g/dL (ref 6.0–8.3)

## 2014-11-09 LAB — LIPID PANEL
CHOL/HDL RATIO: 2.5 ratio
Cholesterol: 114 mg/dL (ref 0–200)
HDL: 46 mg/dL (ref >=46)
LDL CALC: 31 mg/dL (ref 0–99)
Triglycerides: 185 mg/dL — ABNORMAL HIGH (ref <150)
VLDL: 37 mg/dL (ref 0–40)

## 2014-11-09 LAB — TSH: TSH: 1.222 u[IU]/mL (ref 0.350–4.500)

## 2014-11-09 LAB — VITAMIN D 25 HYDROXY (VIT D DEFICIENCY, FRACTURES): Vit D, 25-Hydroxy: 39 ng/mL (ref 30–100)

## 2014-12-12 ENCOUNTER — Encounter: Payer: BC Managed Care – PPO | Admitting: Internal Medicine

## 2015-02-07 ENCOUNTER — Telehealth: Payer: Self-pay | Admitting: *Deleted

## 2015-02-07 NOTE — Telephone Encounter (Signed)
Pt called asking if consult visit needed if she and husband are trying to conceive, I advise pt no need to consult, okay to stop birth control pills.

## 2015-04-30 ENCOUNTER — Ambulatory Visit (INDEPENDENT_AMBULATORY_CARE_PROVIDER_SITE_OTHER): Payer: BC Managed Care – PPO | Admitting: Family Medicine

## 2015-04-30 ENCOUNTER — Encounter: Payer: Self-pay | Admitting: Family Medicine

## 2015-04-30 VITALS — BP 104/70 | HR 84 | Resp 12 | Ht 68.0 in | Wt 164.8 lb

## 2015-04-30 DIAGNOSIS — Z91030 Bee allergy status: Secondary | ICD-10-CM

## 2015-04-30 DIAGNOSIS — Z91038 Other insect allergy status: Secondary | ICD-10-CM

## 2015-04-30 DIAGNOSIS — Z3169 Encounter for other general counseling and advice on procreation: Secondary | ICD-10-CM | POA: Diagnosis not present

## 2015-04-30 DIAGNOSIS — F411 Generalized anxiety disorder: Secondary | ICD-10-CM | POA: Diagnosis not present

## 2015-04-30 MED ORDER — EPINEPHRINE 0.3 MG/0.3ML IJ SOAJ: 0.3000 mg | Freq: Once | INTRAMUSCULAR | Status: DC

## 2015-04-30 NOTE — Patient Instructions (Signed)
Avoid people with Parvovirus when you are pregnant. Start taking a prenatal vitamin daily.  Preparing for Pregnancy Before trying to become pregnant, make an appointment with your health care provider (preconception care). The goal is to help you have a healthy, safe pregnancy. At your first appointment, your health care provider will:   Do a complete physical exam, including a Pap test.  Take a complete medical history.  Give you advice and help you resolve any problems. PRECONCEPTION CHECKLIST Here is a list of the basics to cover with your health care provider at your preconception visit:  Medical history.  Tell your health care provider about any diseases you have had. Many diseases can affect your pregnancy.  Include your partner's medical history and family history.  Make sure you have been tested for sexually transmitted infections (STIs). These can affect your pregnancy. In some cases, they can be passed to your baby. Tell your health care provider about any history of STIs.  Make sure your health care provider knows about any previous problems you have had with conception or pregnancy.  Tell your health care provider about any medicine you take. This includes herbal supplements and over-the-counter medicines.  Make sure all your immunizations are up to date. You may need to make additional appointments.  Ask your health care provider if you need any vaccinations or if there are any you should avoid.  Diet.  It is especially important to eat a healthy, balanced diet with the right nutrients when you are pregnant.  Ask your health care provider to help you get to a healthy weight before pregnancy.  If you are overweight, you are at higher risk for certain complications. These include high blood pressure, diabetes, and preterm birth.  If you are underweight, you are more likely to have a low-birth-weight baby.  Lifestyle.  Tell your health care provider about  lifestyle factors such as alcohol use, drug use, or smoking.  Describe any harmful substances you may be exposed to at work or home. These can include chemicals, pesticides, and radiation.  Mental health.  Let your health care provider know if you have been feeling depressed or anxious.  Let your health care provider know if you have a history of substance abuse.  Let your health care provider know if you do not feel safe at home. HOME INSTRUCTIONS TO PREPARE FOR PREGNANCY Follow your health care provider's advice and instructions.   Keep an accurate record of your menstrual periods. This makes it easier for your health care provider to determine your baby's due date.  Begin taking prenatal vitamins and folic acid supplements daily. Take them as directed by your health care provider.  Eat a balanced diet. Get help from a nutrition counselor if you have questions or need help.  Get regular exercise. Try to be active for at least 30 minutes a day most days of the week.  Quit smoking, if you smoke.  Do not drink alcohol.  Do not take illegal drugs.  Get medical problems, such as diabetes or high blood pressure, under control.  If you have diabetes, make sure you do the following:  Have good blood sugar control. If you have type 1 diabetes, use multiple daily doses of insulin. Do not use split-dose or premixed insulin.  Have an eye exam by a qualified eye care professional trained in caring for people with diabetes.  Get evaluated by your health care provider for cardiovascular disease.  Get to a healthy weight. If  you are overweight or obese, reduce your weight with the help of a qualified health professional such as a Museum/gallery exhibitions officer. Ask your health care provider what the right weight range is for you. HOW DO I KNOW I AM PREGNANT? You may be pregnant if you have been sexually active and you miss your period. Symptoms of early pregnancy include:   Mild cramping.  Very  light vaginal bleeding (spotting).  Feeling unusually tired.  Morning sickness. If you have any of these symptoms, take a home pregnancy test. These tests look for a hormone called human chorionic gonadotropin (hCG) in your urine. Your body begins to make this hormone during early pregnancy. These tests are very accurate. Wait until at least the first day you miss your period to take one. If you get a positive result, call your health care provider to make appointments for prenatal care. WHAT SHOULD I DO IF I BECOME PREGNANT?  Make an appointment with your health care provider by week 12 of your pregnancy at the latest.  Do not smoke. Smoking can be harmful to your baby.  Do not drink alcoholic beverages. Alcohol is related to a number of birth defects.  Avoid toxic odors and chemicals.  You may continue to have sexual intercourse if it does not cause pain or other problems, such as vaginal bleeding.   This information is not intended to replace advice given to you by your health care provider. Make sure you discuss any questions you have with your health care provider.   Document Released: 05/29/2008 Document Revised: 07/07/2014 Document Reviewed: 05/23/2013 Elsevier Interactive Patient Education [employer]

## 2015-04-30 NOTE — Progress Notes (Signed)
Chief Complaint  Patient presents with  . TO GET ESTAB.   She is concerned about weight gain. She lost weight for her wedding, gained it all back and has been struggling to lose it again. She exercises at least 3x/week for an hour, sometimes up to 5x/week.  She runs, does boot camp type classes at the gym, eats healthy. She signed up for Weight Watchers in August, and tracking her intake. Unless she "eats like a rabbit" she can't seem to lose the weight. She is wondering if it could be the birth control, or other reason for trouble losing weight.  She has been discussing with her husband about getting pregnant.  She went to a program about [employer] over the weekend (saliva test, genetic testing for Jewish people, $149, special deal for $36 for [place] residents). Plans to do before pregnancy, will need me to sign off on something they send. She is asking about what she needs to do to be ready for pregnancy.  She is currently on OCP's.  Husband has been out of work x 4 months. He also has some family stress. She has moments of anxiety--sometimes a walk or going to sleep can help Asking for something "to take the edge off",  For when she feels like she is "a ball of stress".  Sometimes she will have her IBS flare related to this anxiety--she wakes up in the middle of the night, and will vomit.  She had an episode just the other night--had even taken an acid med prior to bedtime (due to having had a glass of wine, large meal), which didn't prevent the vomiting. Previously saw Dr. Earlean Shawl, when getting sick more frequently in college.  (then she needed phenergan suppositories). Her IBS has usually manifested in this manner--abdominal pain and vomiting, usually at night, rather than constipation or diarrhea.   She is requesting refill of Epi-pen, as hers has expired.  Past Medical History  Diagnosis Date  . IBS (irritable bowel syndrome)   . Headache(784.0)     menstrual   . Bee sting allergy   .  Scoliosis     very mild  . GERD (gastroesophageal reflux disease)   . Eczema    Past Surgical History  Procedure Laterality Date  . Wisdom tooth extraction    . Gum surgery     Social History   Social History  . Marital Status: Married    Spouse Name: N/A  . Number of Children: N/A  . Years of Education: N/A   Occupational History  . speech therapist at [employer]  . Smoking status: Never Smoker   . Smokeless tobacco: Never Used  . Alcohol Use: 0.6 oz/week    1 Standard drinks or equivalent per week     Comment: 0-1 drink/week  . Drug Use: No  . Sexual Activity: Yes    Birth Control/ Protection: Pill     Comment: 1st intercourse 29 yo--Fewer than 5 partners   Other Topics Concern  . Not on file   Social History Narrative   Married. 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   1 dog.   Family History  Problem Relation Age of Onset  . Hypertension Mother   . Diabetes Maternal Grandfather   . Hypertension Maternal Grandfather   . Breast cancer Paternal Grandmother     Age 90  . Cancer Paternal Grandfather     multiple myeloma  .  Asthma Sister   . Eczema Sister    Immunization History  Administered Date(s) Administered  . DTaP 09/30/1985, 12/02/1985, 02/10/1986, 09/28/1987, 10/20/1990  . HPV Quadrivalent 04/08/2006  . Hepatitis B 04/07/1997, 05/11/1997, 10/17/1997  . HiB (PRP-OMP) 02/09/1987  . IPV 09/30/1985, 12/02/1985, 09/28/1987, 10/20/1990  . Influenza,inj,Quad PF,36+ Mos 04/27/2012  . Influenza-Unspecified 03/30/2014, 04/07/2015  . MMR 11/10/1986, 08/23/1996  . Meningococcal Conjugate 12/18/2003  . PPD Test 09/25/2009  . Td 08/30/1999  . Tdap 01/20/2011    Outpatient Encounter Prescriptions as of 04/30/2015  Medication Sig  . drospirenone-ethinyl estradiol (GIANVI) 3-0.02 MG tablet TAKE 1 TABLET ONCE DAILY. (FOR CONTINUOUS USE)  . EPINEPHrine (EPI-PEN) 0.3 mg/0.3 mL SOAJ  injection Inject 0.3 mLs (0.3 mg total) into the muscle once.  Marland Kitchen ibuprofen (ADVIL,MOTRIN) 800 MG tablet Take 1 tablet (800 mg total) by mouth every 8 (eight) hours as needed.   Facility-Administered Encounter Medications as of 04/30/2015  Medication  . TDaP (BOOSTRIX) injection 0.5 mL   Allergies  Allergen Reactions  . Nutritional Supplements Other (See Comments)    Bee stings- Tongue swelling  . Amoxicillin Hives  . Macrodantin [Nitrofurantoin Macrocrystal] Nausea And Vomiting  . Penicillins Hives  . Pineapple     Itchy throat  . Sulfa Antibiotics Hives   ROS:  The patient denies anorexia, fever, weight changes (just inability to lose), vision changes, decreased hearing, ear pain, sore throat, breast concerns, chest pain, palpitations, dizziness, syncope, dyspnea on exertion, cough, swelling, diarrhea, constipation, melena, hematochezia, indigestion/heartburn, hematuria, incontinence, dysuria, irregular menstrual cycles, vaginal discharge, odor or itch, genital lesions, joint pains, numbness, tingling, weakness, tremor, suspicious skin lesions, depression, abnormal bleeding/bruising, or enlarged lymph nodes. +eczema +anxiety as per HPI (infrequent). +headaches--usually related to cycles; takes her pills to cycle every 2 months. Some LBP, for which she gets regular massages with good results.   PHYSICAL EXAM: BP 104/70 mmHg  Pulse 84  Resp 12  Ht $R'5\' 8"'Gk$  (1.727 m)  Wt 164 lb 12.8 oz (74.753 kg)  BMI 25.06 kg/m2  SpO2 99%  LMP 02/05/2015  Well developed, pleasant female in no distress HEENT: PERRL, EOMI, conjunctiva clear, OP clear Neck: No lymphadenopathy or thyromegaly, no carotid bruit Heart:  Regular rate and rhythm, no murmurs, rubs, gallops or ectopy Lungs:  Clear bilaterally, without wheezes, rales or ronchi Abdomen:  Soft, nontender, nondistended, no hepatosplenomegaly or masses, normal bowel sounds Extremities:  No clubbing, cyanosis or edema, 2+ pulses.  Neuro:  Alert  and oriented x 3, cranial nerves grossly intact.  DTR's 2+ and symmetric.  Normal strength and sensation Back:  No spine or CVA tenderness Skin: no rashes or suspicious lesions Psych:  Normal mood, affect, hygiene and grooming, normal speech, eye contact  ASSESSMENT/PLAN:  Encounter for preconception consultation  Bee sting allergy - Plan: EPINEPHrine 0.3 mg/0.3 mL IJ SOAJ injection  Anxiety state  Desired weight loss--reassured that her weight was healthy for pregnancy (loss was not needed or recommended prior to attempting pregnancy). Counseled extensively re: healthy diet, portions, exercise. Continue Weight Watchers  Anxiety--risks/side effects of Xanax reviewed, not recommended if pregnant Stress reduction techniques Avoid meds now due to desired pregnancy.  preconceptive counseling Start PNV daily. Immunizations are UTD, h/o Varicella as a child and has had MMR x 2 (will have labs done when pregnant).  Will get another Tdap when pregnant, UTD now. Many questions answered, handout reviewed.  50 minute visit, more than 1/2 spent counseling

## 2015-05-13 ENCOUNTER — Encounter: Payer: Self-pay | Admitting: Family Medicine

## 2015-05-30 ENCOUNTER — Encounter: Payer: Self-pay | Admitting: Family Medicine

## 2015-06-28 ENCOUNTER — Encounter: Payer: Self-pay | Admitting: Family Medicine

## 2015-07-09 ENCOUNTER — Encounter: Payer: Self-pay | Admitting: Family Medicine

## 2015-07-09 DIAGNOSIS — E7522 Gaucher disease: Secondary | ICD-10-CM

## 2015-07-10 ENCOUNTER — Encounter: Payer: Self-pay | Admitting: Family Medicine

## 2015-07-10 DIAGNOSIS — E7522 Gaucher disease: Secondary | ICD-10-CM | POA: Insufficient documentation

## 2015-07-10 NOTE — Telephone Encounter (Signed)
I spoke with patient today, discussed that she is homozygous for Gaucher disease, type 1. We discussed what this meant, manifestations.  Chart was reviewed--no hepatosplenomegaly on exam, and normal CBC (no anemia and platelets were normal).  There are no overt manifestations of disease.  We discussed that she is at higher risk for low bone density. She should have additional testing to confirm (GBA enzyme activity vs additional genetic testing or other).  It is recommended that she have medical genetics consultation. This original testing was done as part of her family planning (planning pregnancy, also married to someone of Ashkenazi descent), so she is concerned no only with the fact that she has this disease, but also the risk of passing it (along with others) to any child she has.  Her husband's testing isn't back yet--it should be back within another 2 weeks or so.  V--Please set up medical genetics consultation for Kelly Navarro, but make sure the appointment isn't for another 3 weeks, allowing time for her husband's results to also be available (so that questions regarding risk to children can be addressed at the visit).  Late afternoons (after 3) are preferred, but any time would be okay if not available.  Thanks

## 2015-07-12 ENCOUNTER — Other Ambulatory Visit: Payer: Self-pay | Admitting: *Deleted

## 2015-07-25 ENCOUNTER — Telehealth: Payer: Self-pay | Admitting: *Deleted

## 2015-07-25 NOTE — Telephone Encounter (Signed)
Called patient and let her know she is scheduled with [facility] on 08/03/15 @ 2:15pm tel [phone]. She also has an appt with [facility] that I scheduled prior to this one for 11/15/15 @ 11:30am for 11/15/15 with Dr.Evelyn Rawcliffe-Kimbrell @ [facility] [address] [place]. [place]) [phone] asked her to let me know if she wants to keep the appt at [facility] or if she wants to cancel.

## 2015-08-01 ENCOUNTER — Telehealth: Payer: Self-pay | Admitting: Family Medicine

## 2015-08-01 NOTE — Telephone Encounter (Signed)
Received request from [facility] health care Department of obstetrics and gynecology division of maternal fetal medicine. Labs requested faxed to [phone]

## 2015-08-14 ENCOUNTER — Encounter: Payer: Self-pay | Admitting: Family Medicine

## 2015-09-03 ENCOUNTER — Other Ambulatory Visit: Payer: Self-pay | Admitting: *Deleted

## 2015-09-03 ENCOUNTER — Encounter: Payer: Self-pay | Admitting: Family Medicine

## 2015-09-03 DIAGNOSIS — E7522 Gaucher disease: Secondary | ICD-10-CM

## 2015-09-14 ENCOUNTER — Ambulatory Visit
Admission: RE | Admit: 2015-09-14 | Discharge: 2015-09-14 | Disposition: A | Discharge status: Home / Self Care | Payer: BC Managed Care – PPO | Source: Ambulatory Visit | Attending: Family Medicine | Admitting: Family Medicine

## 2015-09-14 DIAGNOSIS — E7522 Gaucher disease: Secondary | ICD-10-CM

## 2015-09-19 ENCOUNTER — Encounter: Payer: BC Managed Care – PPO | Admitting: Gynecology

## 2015-09-27 ENCOUNTER — Encounter: Payer: Self-pay | Admitting: Family Medicine

## 2015-10-03 DIAGNOSIS — N970 Female infertility associated with anovulation: Secondary | ICD-10-CM | POA: Diagnosis not present

## 2015-10-30 DIAGNOSIS — Z3149 Encounter for other procreative investigation and testing: Secondary | ICD-10-CM | POA: Diagnosis not present

## 2015-11-09 ENCOUNTER — Encounter: Payer: Self-pay | Admitting: Family Medicine

## 2015-12-21 ENCOUNTER — Ambulatory Visit (INDEPENDENT_AMBULATORY_CARE_PROVIDER_SITE_OTHER): Payer: BC Managed Care – PPO | Admitting: Family Medicine

## 2015-12-21 ENCOUNTER — Encounter: Payer: Self-pay | Admitting: Family Medicine

## 2015-12-21 VITALS — BP 114/70 | HR 68 | Temp 97.9°F | Wt 165.8 lb

## 2015-12-21 DIAGNOSIS — J029 Acute pharyngitis, unspecified: Secondary | ICD-10-CM

## 2015-12-21 DIAGNOSIS — Z3149 Encounter for other procreative investigation and testing: Secondary | ICD-10-CM | POA: Diagnosis not present

## 2015-12-21 LAB — POCT RAPID STREP A (OFFICE): Rapid Strep A Screen: NEGATIVE

## 2015-12-21 NOTE — Patient Instructions (Signed)
Strep test was negative. I suspect you have a virus at this point. Treat your symptoms as we discussed.

## 2015-12-21 NOTE — Progress Notes (Signed)
Subjective:  Kelly Navarro is a 30 y.o. female who presents for evaluation of a 1 day history of sore throat.  She has not had a recent close exposure to someone with proven streptococcal pharyngitis.  Associated symptoms include post nasal drainage, rhinorrhea.  States she had strep last year and is worried that she has strep again.  States she typically gets a cold after having a sore throat.  Reports she is trying to conceive and "could be pregnant".   Denies fever, chills, nausea, vomiting, cough, ear pain.  Treatment to date: hot tea, aleve, cholarseptic cough drops.  ? sick contacts.  No other aggravating or relieving factors.  No other c/o.  The following portions of the patient's history were reviewed and updated as appropriate: allergies, current medications, past medical history, past social history, past surgical history and problem list.  ROS as in subjective   Objective: Filed Vitals:   12/21/15 1003  BP: 114/70  Pulse: 68  Temp: 97.9 F (36.6 C)    General appearance: no distress, WD/WN, is not ill-appearing HEENT: normocephalic, conjunctiva/corneas normal, sclerae anicteric, nares patent, no discharge or erythema, pharynx with erythema, without exudate.  Oral cavity: MMM, no lesions  Neck: supple, no lymphadenopathy, no thyromegaly Heart: RRR, normal S1, S2, no murmurs Lungs: CTA bilaterally, no wheezes, rhonchi, or rales   Laboratory Strep test done. Results:negative.    Assessment and Plan:  Acute pharyngitis, unspecified etiology  Sore throat - Plan: POCT rapid strep A   Advised that symptoms and exam suggest a viral etiology.  Discussed symptomatic treatment including salt water gargles, warm fluids, rest, hydrate well, can use over-the-counter Tylenol for throat pain, fever, or malaise. If worse or not improving within 2-3 days, call or return.

## 2015-12-24 ENCOUNTER — Encounter: Payer: Self-pay | Admitting: Family Medicine

## 2015-12-24 NOTE — Telephone Encounter (Signed)
Handled by Dr. Lynelle DoctorKnapp already

## 2015-12-28 DIAGNOSIS — N979 Female infertility, unspecified: Secondary | ICD-10-CM | POA: Diagnosis not present

## 2016-01-30 DIAGNOSIS — N979 Female infertility, unspecified: Secondary | ICD-10-CM | POA: Diagnosis not present

## 2016-02-01 DIAGNOSIS — N979 Female infertility, unspecified: Secondary | ICD-10-CM | POA: Diagnosis not present

## 2016-03-07 DIAGNOSIS — N911 Secondary amenorrhea: Secondary | ICD-10-CM | POA: Diagnosis not present

## 2016-03-13 DIAGNOSIS — Z36 Encounter for antenatal screening of mother: Secondary | ICD-10-CM | POA: Diagnosis not present

## 2016-03-13 DIAGNOSIS — Z3401 Encounter for supervision of normal first pregnancy, first trimester: Secondary | ICD-10-CM | POA: Diagnosis not present

## 2016-03-13 LAB — OB RESULTS CONSOLE GC/CHLAMYDIA
CHLAMYDIA, DNA PROBE: NEGATIVE
GC PROBE AMP, GENITAL: NEGATIVE

## 2016-03-13 LAB — OB RESULTS CONSOLE ABO/RH: RH Type: POSITIVE

## 2016-03-13 LAB — OB RESULTS CONSOLE HEPATITIS B SURFACE ANTIGEN: Hepatitis B Surface Ag: NEGATIVE

## 2016-03-13 LAB — OB RESULTS CONSOLE RUBELLA ANTIBODY, IGM: Rubella: IMMUNE

## 2016-03-13 LAB — OB RESULTS CONSOLE ANTIBODY SCREEN: Antibody Screen: NEGATIVE

## 2016-03-13 LAB — OB RESULTS CONSOLE RPR: RPR: NONREACTIVE

## 2016-03-13 LAB — OB RESULTS CONSOLE HIV ANTIBODY (ROUTINE TESTING): HIV: NONREACTIVE

## 2016-03-18 ENCOUNTER — Encounter (HOSPITAL_COMMUNITY): Payer: Self-pay

## 2016-03-18 ENCOUNTER — Ambulatory Visit (HOSPITAL_COMMUNITY)
Admission: RE | Admit: 2016-03-18 | Discharge: 2016-03-18 | Disposition: A | Discharge status: Home / Self Care | Payer: BC Managed Care – PPO | Source: Ambulatory Visit | Attending: Obstetrics & Gynecology | Admitting: Obstetrics & Gynecology

## 2016-03-18 DIAGNOSIS — E7522 Gaucher disease: Secondary | ICD-10-CM | POA: Diagnosis not present

## 2016-03-18 DIAGNOSIS — O285 Abnormal chromosomal and genetic finding on antenatal screening of mother: Secondary | ICD-10-CM | POA: Diagnosis not present

## 2016-03-18 DIAGNOSIS — Z3A08 8 weeks gestation of pregnancy: Secondary | ICD-10-CM | POA: Diagnosis not present

## 2016-03-28 DIAGNOSIS — R509 Fever, unspecified: Secondary | ICD-10-CM | POA: Diagnosis not present

## 2016-03-29 ENCOUNTER — Ambulatory Visit: Payer: BC Managed Care – PPO

## 2016-03-31 ENCOUNTER — Ambulatory Visit (INDEPENDENT_AMBULATORY_CARE_PROVIDER_SITE_OTHER): Payer: BC Managed Care – PPO | Admitting: Family Medicine

## 2016-03-31 ENCOUNTER — Encounter: Payer: Self-pay | Admitting: Family Medicine

## 2016-03-31 VITALS — BP 102/62 | HR 100 | Temp 99.9°F | Ht 68.0 in | Wt 171.2 lb

## 2016-03-31 DIAGNOSIS — R509 Fever, unspecified: Secondary | ICD-10-CM | POA: Diagnosis not present

## 2016-03-31 DIAGNOSIS — Z3480 Encounter for supervision of other normal pregnancy, unspecified trimester: Secondary | ICD-10-CM | POA: Diagnosis not present

## 2016-03-31 DIAGNOSIS — Z3A10 10 weeks gestation of pregnancy: Secondary | ICD-10-CM

## 2016-03-31 DIAGNOSIS — Z3401 Encounter for supervision of normal first pregnancy, first trimester: Secondary | ICD-10-CM | POA: Diagnosis not present

## 2016-03-31 NOTE — Progress Notes (Signed)
Chief Complaint  Patient presents with  . Cough    fever, chills that started Friday. Also achy. Is [redacted] weeks pregnant. Went to [facility] Friday-they did strep, flu and UTI test-all negative.  Has regular OB appt today at [facility].    Earlier last week she had a slight cough.  Had increased nausea last week, which she related to her pregnancy. She is [redacted] weeks along, and other than nausea, has been doing very well.  3 days ago, after taking a nap she woke up achey and feverish.  T99.8. She went to UC, tested negative for flu, strep, UTI. Continues to feel achey, fatigued, chills, some headache.  Cough is a little worse today. Cough feels wet, like it is catching in her chest, but not able to expectorate any phlegm.  Denies sinus pain, pressure.  Some sneezing.  No ear pain, sore throat. Slight woozy/dizzy in her head.    Sister was sick with similar symptoms just prior to [facility].  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  No tick bites, rashes.  No tylenol since last night 8pm.  PMH, PSH, SH reviewed  Outpatient Encounter Prescriptions as of 03/31/2016  Medication Sig Note  . acetaminophen (TYLENOL) 500 MG tablet Take 1,000 mg by mouth every 6 (six) hours as needed. 03/31/2016: Last dose last night  . Doxylamine-Pyridoxine (DICLEGIS PO) Take 2 tablets by mouth as needed.   . Prenatal Vit-Fe Fumarate-FA (PRENATAL VITAMIN PO) Take by mouth.   . [DISCONTINUED] EPINEPHrine 0.3 mg/0.3 mL IJ SOAJ injection Inject 0.3 mLs (0.3 mg total) into the muscle once.   . [DISCONTINUED] ibuprofen (ADVIL,MOTRIN) 800 MG tablet Take 1 tablet (800 mg total) by mouth every 8 (eight) hours as needed. (Patient not taking: Reported on 03/18/2016)    Facility-Administered Encounter Medications as of 03/31/2016  Medication  . TDaP (BOOSTRIX) injection 0.5 mL   Allergies  Allergen Reactions  . Nutritional Supplements Other (See Comments)    Bee stings- Tongue swelling  .  Amoxicillin Hives  . Macrodantin [Nitrofurantoin Macrocrystal] Nausea And Vomiting  . Penicillins Hives  . Pineapple     Itchy throat  . Sulfa Antibiotics Hives   ROS: no chest pain, palpitations, abdominal pain, urinary complaints, vaginal discharge, bleeding, diarrhea, rashes, dizziness. +fevers and achiness with cough as per HPI.  PHYSICAL EXAM:  BP 102/62 (BP Location: Left Arm, Patient Position: Sitting, Cuff Size: Normal)   Pulse 100   Temp 99.9 F (37.7 C) (Tympanic)   Ht 5\' 8"  (1.727 m)   Wt 171 lb 3.2 oz (77.7 kg)   BMI 26.03 kg/m   Well developed, pleasant female in no distress. Occasional throat clearing, no coughing HEENT: PERRL, EOMi, conjunctiva and sclera are clear.  TM's and EAC's normal. Nasal mucosa is mildly edematous with clear mucus, R>L (some septal deviation). Sinuses nontender.  OP--tonsils normal, moist mucus membranes, no lesions. Mild erythema of R>L anterior tonsillar pillar Neck: no lymphadenopathy or mass Heart: regular rate and rhythm without murmur Lungs: clear bilaterally Back: no CVA tenderness Abdomen: soft, nontender, no organomegaly or mass Extremities: no edema, normal pulses Psych: normal mood, affect, hygiene and grooming Neuro: alert and oriented, cranial nerves intact, normal strength, gait  ASSESSMENT/PLAN:  Fever, unspecified fever cause  [redacted] weeks gestation of pregnancy  Suspect viral syndrome. Supportive measures reviewed   Gets flu shot at school next week.

## 2016-03-31 NOTE — Patient Instructions (Signed)
I suspect you have a viral syndrome.  I don't see any evidence of a bacterial infection.  Please let us know if you develop any new symptoms--rash, higher fevers, worsening cough, discolored mucus.  Use Robitussin to help break up the mucus in the chest. You likely have some post nasal drainage contributing to the cough and chest congestion. claritin should be safe--verify the OTC medications with your OB this afternoon. Continue tylenol as needed for any fever, headache or body aches.

## 2016-04-08 DIAGNOSIS — Z23 Encounter for immunization: Secondary | ICD-10-CM | POA: Diagnosis not present

## 2016-04-09 DIAGNOSIS — Z36 Encounter for antenatal screening for chromosomal anomalies: Secondary | ICD-10-CM | POA: Diagnosis not present

## 2016-04-09 DIAGNOSIS — Z3A11 11 weeks gestation of pregnancy: Secondary | ICD-10-CM | POA: Diagnosis not present

## 2016-04-09 DIAGNOSIS — Z3682 Encounter for antenatal screening for nuchal translucency: Secondary | ICD-10-CM | POA: Diagnosis not present

## 2016-04-09 DIAGNOSIS — Z3491 Encounter for supervision of normal pregnancy, unspecified, first trimester: Secondary | ICD-10-CM | POA: Diagnosis not present

## 2016-04-24 NOTE — Progress Notes (Signed)
30 year old, G1 at

## 2016-04-24 NOTE — Progress Notes (Signed)
MFM Consult (addendum/rewrite as first consult note signed 19 Sept 2017 did not save in EMR as permanent or draft form)  30 year old, G1 at 8 weeks 3 days gestation seen in consultation today for a maternal history of Gaucher Disease. She notes that this pregnancy has been uncomplicated to date. She did remark on having an early ultrasound which she noted at the end of the visit she had been told that there was a uterine anomaly. She did not seem to recognize the terms uterine septum, arcuate, uterine didelphis or bicornuate uterus. I gave her my card with email contact information to send me the report or the information about whether there was a uterine anomaly and have not received an email.   Past Medical History:  Diagnosis Date  . Bee sting allergy   . 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 Eczema   . Gaucher disease, type I ([place])    noted on [facility] genetic testing 05/2015  . GERD (gastroesophageal reflux disease)   . Headache(784.0)    menstrual   . IBS (irritable bowel syndrome)   . Scoliosis    very mild   Past Surgical History:  Procedure Laterality Date  . GUM SURGERY    . WISDOM TOOTH EXTRACTION     Social History   Social History  . Marital status: Married    Spouse name: N/A  . Number of children: N/A  . Years of education: N/A   Occupational History  . 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 Smoking status: Never Smoker  . Smokeless tobacco: Never Used  . Alcohol use 0.6 oz/week    1 Standard drinks or equivalent per week     Comment: none with pregnancy  . Drug use: No  . Sexual activity: Yes     Comment: 1st intercourse 30 yo--Fewer than 5 partners   Other Topics Concern  . Not on file   Social History Narrative   Married. 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    1 dog.   Family History  Problem Relation Age of Onset  . Hypertension Mother   . Asthma Sister   . Eczema Sister   . Diabetes Maternal Grandfather   . Hypertension Maternal Grandfather   . Breast cancer Paternal Grandmother     Age 73  . Cancer Paternal Grandfather     multiple myeloma   No current outpatient prescriptions on file prior to encounter.   Current Facility-Administered Medications on File Prior to Encounter  Medication Dose Route Frequency Provider Last Rate Last Dose  . TDaP (BOOSTRIX) injection 0.5 mL  0.5 mL Intramuscular Once Lanice Shirts, MD       Allergies  Allergen Reactions  . Nutritional Supplements Other (See Comments)    Bee stings- Tongue swelling  . Amoxicillin Hives  . Macrodantin [Nitrofurantoin Macrocrystal] Nausea And Vomiting  . Penicillins Hives  . Pineapple     Itchy throat  . Sulfa Antibiotics Hives   Vitals:   03/18/16 1611  BP: 109/61  Pulse: 72   Impression/Recommendations #1 Gaucher disease  - Gaucher disease (GD) is a lysosomal disorder caused by inherited deficiency of glucocerebrosidase, resulting in the accumulation of glucocerebroside in macrophages, termed "Gaucher cells" (GCs), leading to multiorgan involvement, with hepatosplenomegaly, cytopenias, pulmonary hypertension and osseous complications. The characteristic feature of  GD is the organ GCs infiltration compromising their function by inducing local inflammation, infarcts and fibrosis. Gaucher  females with a mild or asymptomatic course may have successful pregnancies without ERT. In symptomatic women ERT is indicated aiming to reduce the risk of disease exacerbation and prevent GD-related hematological and bony complications. Both the Food and Drug Administration (FDA) and the European Medicines Agency (EMA) have approved the use of imiglucerase during pregnancy in symptomatic GD1 patients with a clear indication for ERT and in patients treated with ERT prior to  conception. Data collected from a multicenter collaborative study showed no evidence of any adverse effects of alglucerase or imiglucerase on the fetus, including exposure during the first trimester, or among infants that were breast-fed by mothers receiving ERT. Women who were treated with ERT prior to conception should not discontinue enzyme treatment during pregnancy because of potential exacerbation of the disease symptoms. In a recent study, Elstein et al. reported the efficacy and safety of Velaglucerase alfa (Vipriv) in 25 singleton pregnancies. Velaglucerase Alfa was effective in terms of hematological parameters and was proven to be safe for conception and pregnancy, with good maternal and neonatal outcomes. A delay in conception may be suggested for treated GD patients with marked visceromegaly and pancytopenia to enable pregnancy following improvement in these disease parameters.  - A reduced risk of spontaneous abortion and bleeding complications was noted in alglucerase/imiglucerase-treated pregnancies compared with untreated ones. Mild thrombocytopenia (80,000?100,000/mm3) detected during uncomplicated pregnancy is unlikely to require special obstetric measures. Assessment of the platelet function is recommended before delivery in order to plan the mode of anesthesia and enable bleeding prevention by preparing platelet concentrates for transfusion. In GD females with concurrent immune thrombocytopenia during pregnancy, neonatal thrombocytopenia should be prevented by following offspring?s platelet counts after delivery. Calcium and vitamin D depletion in women with GD may increase the risk of osteoporosis during and following pregnancies, therefore, levels of calcium and vitamin D should be measured once in each trimester as part of routine blood tests. Vitamin B12 supplementation may also be required during pregnancy in this clinical setting. Serum iron levels, transferrin and iron saturation should  be monitored, given that ferritin levels are usually high in GD and may mask iron deficiency. Check platelet counts qtrimester and consider an anesthesia consult at ~30-32 weeks - She is well versed in the implications, genetics and testing options for Gaucher Disease outside of pregnancy. 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 - We discussed the literature related to Gaucher Disease and it effects on pregnancy, pregnancy on Gaucher Disease and implications for the developing baby. Most folks with Gauchers have Type 1 disease though it is not clear that she has had genotyping. In a review of 453 pregnancies in 36 women with Gauchers (the vast majority having type 1), >92% had a normal outcome (live birth at term with no congenital anomalies), 4.4% had spontaneous abortion, 2.9% elective abortion, and 0.2% or 1 had a neonatal death. Pregnancies prior to 1989/10/02 (before enzyme therapy was available) had similar proportions for the outcome data. She has never had enzyme replacement therapy and it does not appear to affect pregnancy outcome significantly, so would not recommend institution of therapy at this time.  #  2 Question uterine anomaly - She did remark on having an early ultrasound which she noted at the end of the visit she had been told that there was a uterine anomaly. She did not seem to recognize the terms uterine septum, arcuate, uterine didelphis or bicornuate uterus. I gave her my card with email contact information to send me the report or the information about whether there was a uterine anomaly and have not received an email.   #3 IBS She notes this is not a significant medical issue so far. We discussed the normal changes on bowel function with pregnancy and she was not having issues with constipation.   Questions appear answered to her satisfaction. Precautions for the above given. Spent greater than 1/2 of 40 minute visit face to face counseling and reviewing the literature.

## 2016-04-24 NOTE — Addendum Note (Signed)
Encounter addended by: Ledon SnareBrian Adely Facer, MD on: 04/24/2016 10:25 AM<BR>    Actions taken: Pend clinical note, Sign clinical note, Charge Capture section accepted

## 2016-05-20 DIAGNOSIS — N390 Urinary tract infection, site not specified: Secondary | ICD-10-CM | POA: Diagnosis not present

## 2016-05-27 DIAGNOSIS — Z3480 Encounter for supervision of other normal pregnancy, unspecified trimester: Secondary | ICD-10-CM | POA: Diagnosis not present

## 2016-05-27 DIAGNOSIS — Z362 Encounter for other antenatal screening follow-up: Secondary | ICD-10-CM | POA: Diagnosis not present

## 2016-05-27 DIAGNOSIS — Z3A18 18 weeks gestation of pregnancy: Secondary | ICD-10-CM | POA: Diagnosis not present

## 2016-07-18 DIAGNOSIS — Z3480 Encounter for supervision of other normal pregnancy, unspecified trimester: Secondary | ICD-10-CM | POA: Diagnosis not present

## 2016-07-18 DIAGNOSIS — N760 Acute vaginitis: Secondary | ICD-10-CM | POA: Diagnosis not present

## 2016-07-18 DIAGNOSIS — Z23 Encounter for immunization: Secondary | ICD-10-CM | POA: Diagnosis not present

## 2016-08-05 ENCOUNTER — Inpatient Hospital Stay (HOSPITAL_COMMUNITY)
Admission: AD | Admit: 2016-08-05 | Discharge: 2016-08-05 | Disposition: A | Discharge status: Home / Self Care | Payer: BC Managed Care – PPO | Source: Ambulatory Visit | Attending: Obstetrics and Gynecology | Admitting: Obstetrics and Gynecology

## 2016-08-05 ENCOUNTER — Encounter (HOSPITAL_COMMUNITY): Payer: Self-pay

## 2016-08-05 DIAGNOSIS — O99613 Diseases of the digestive system complicating pregnancy, third trimester: Secondary | ICD-10-CM | POA: Diagnosis not present

## 2016-08-05 DIAGNOSIS — Z3A28 28 weeks gestation of pregnancy: Secondary | ICD-10-CM

## 2016-08-05 DIAGNOSIS — O26893 Other specified pregnancy related conditions, third trimester: Secondary | ICD-10-CM | POA: Diagnosis not present

## 2016-08-05 DIAGNOSIS — K529 Noninfective gastroenteritis and colitis, unspecified: Secondary | ICD-10-CM | POA: Diagnosis not present

## 2016-08-05 LAB — COMPREHENSIVE METABOLIC PANEL
ALBUMIN: 2.8 g/dL — AB (ref 3.5–5.0)
ALT: 15 U/L (ref 14–54)
ANION GAP: 7 (ref 5–15)
AST: 20 U/L (ref 15–41)
Alkaline Phosphatase: 68 U/L (ref 38–126)
BUN: 7 mg/dL (ref 6–20)
CO2: 23 mmol/L (ref 22–32)
CREATININE: 0.41 mg/dL — AB (ref 0.44–1.00)
Calcium: 8.4 mg/dL — ABNORMAL LOW (ref 8.9–10.3)
Chloride: 105 mmol/L (ref 101–111)
GFR calc non Af Amer: >60 mL/min (ref >60)
GLUCOSE: 92 mg/dL (ref 65–99)
Potassium: 3.7 mmol/L (ref 3.5–5.1)
SODIUM: 135 mmol/L (ref 135–145)
Total Bilirubin: 0.6 mg/dL (ref 0.3–1.2)
Total Protein: 6.7 g/dL (ref 6.5–8.1)

## 2016-08-05 LAB — CBC
HEMATOCRIT: 29.8 % — AB (ref 36.0–46.0)
HEMOGLOBIN: 10.4 g/dL — AB (ref 12.0–15.0)
MCH: 31.1 pg (ref 26.0–34.0)
MCHC: 34.9 g/dL (ref 30.0–36.0)
MCV: 89.2 fL (ref 78.0–100.0)
Platelets: 129 10*3/uL — ABNORMAL LOW (ref 150–400)
RBC: 3.34 MIL/uL — AB (ref 3.87–5.11)
RDW: 14.5 % (ref 11.5–15.5)
WBC: 9.1 10*3/uL (ref 4.0–10.5)

## 2016-08-05 LAB — URINALYSIS, ROUTINE W REFLEX MICROSCOPIC
Bilirubin Urine: NEGATIVE
Glucose, UA: NEGATIVE mg/dL
Hgb urine dipstick: NEGATIVE
Ketones, ur: 20 mg/dL — AB
LEUKOCYTES UA: NEGATIVE
NITRITE: NEGATIVE
Protein, ur: NEGATIVE mg/dL
Specific Gravity, Urine: 1.017 (ref 1.005–1.030)
pH: 6 (ref 5.0–8.0)

## 2016-08-05 MED ORDER — LACTATED RINGERS IV BOLUS (SEPSIS)
1000.0000 mL | Freq: Once | INTRAVENOUS | Status: AC
  Administered 2016-08-05: 1000 mL via INTRAVENOUS

## 2016-08-05 MED ORDER — FAMOTIDINE IN NACL 20-0.9 MG/50ML-% IV SOLN
20.0000 mg | Freq: Once | INTRAVENOUS | Status: AC
  Administered 2016-08-05: 20 mg via INTRAVENOUS
  Filled 2016-08-05: qty 50

## 2016-08-05 MED ORDER — ONDANSETRON HCL 4 MG/2ML IJ SOLN
4.0000 mg | Freq: Once | INTRAMUSCULAR | Status: AC
  Administered 2016-08-05: 4 mg via INTRAVENOUS
  Filled 2016-08-05: qty 2

## 2016-08-05 MED ORDER — PROMETHAZINE HCL 25 MG PO TABS: 25.0000 mg | ORAL_TABLET | Freq: Four times a day (QID) | ORAL | 0 refills | Status: DC | PRN

## 2016-08-05 MED ORDER — PROMETHAZINE HCL 25 MG/ML IJ SOLN
25.0000 mg | Freq: Once | INTRAMUSCULAR | Status: AC
  Administered 2016-08-05: 25 mg via INTRAVENOUS
  Filled 2016-08-05: qty 1

## 2016-08-05 MED ORDER — SODIUM CHLORIDE 0.9 % IV SOLN: 25.0000 mg | Freq: Once | INTRAVENOUS | Status: DC

## 2016-08-05 NOTE — Discharge Instructions (Signed)

## 2016-08-05 NOTE — MAU Provider Note (Signed)
History     CSN: [id number]  Arrival date and time: 08/05/16 1032   First Provider Initiated Contact with Patient 08/05/16 1118      Chief Complaint  Patient presents with  . Nausea  . Diarrhea   HPI Trey Bebee is a 31 y.o. G1P0 at 47w3dwho presents with nausea/vomiting & diarrhea. Symptoms began at 2 am this morning. 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 Has not vomited since 8 am this morning but does reports continued nausea & heartburn. Normally takes zantac 150 mg prn heartburn. Has not taken any medications today. Reports intermittent lower abdominal cramping that she rates 4/10. Denies vaginal bleeding, fever, or LOF. Positive fetal movement.   OB History    Gravida Para Term Preterm AB Living   1             SAB TAB Ectopic Multiple Live Births                  Past Medical History:  Diagnosis Date  . Bee sting allergy   . 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 Eczema   . Ga[place] disease, type I [place]    noted on [facility] genetic testing 05/2015  . GERD (gastroesophageal reflux disease)   . Headache(784.0)    menstrual   . IBS (irritable bowel syndrome)   . Scoliosis    very mild    Past Surgical History:  Procedure Laterality Date  . GUM SURGERY    . WISDOM TOOTH EXTRACTION      Family History  Problem Relation Age of Onset  . Hypertension Mother   . Asthma Sister   . Eczema Sister   . Diabetes Maternal Grandfather   . Hypertension Maternal Grandfather   . Breast cancer Paternal Grandmother     Age 392 . Cancer Paternal Grandfather     multiple myeloma    Social History  Substance Use Topics  . Smoking status: Never Smoker  . Smokeless tobacco: Never Used  . Alcohol use 0.6 oz/week    1 Standard drinks or equivalent per  week     Comment: none with pregnancy    Allergies:  Allergies  Allergen Reactions  . Nutritional Supplements Other (See Comments)    Bee stings- Tongue swelling  . Amoxicillin Hives    Has patient had a PCN reaction causing immediate rash, facial/tongue/throat swelling, SOB or lightheadedness with hypotension: Yes Has patient had a PCN reaction causing severe rash involving mucus membranes or skin necrosis: No Has patient had a PCN reaction that required hospitalization No Has patient had a PCN reaction occurring within the last 10 years: No If all of the above answers are "NO", then may proceed with Cephalosporin use.  . Bee Venom Other (See Comments)    Tongue swelling  . Macrodantin [Nitrofurantoin Macrocrystal] Nausea And Vomiting  . Penicillins Hives    Has patient had a PCN reaction causing immediate rash, facial/tongue/throat swelling, SOB or lightheadedness with hypotension: Yes Has patient had a PCN reaction causing severe rash involving mucus membranes or skin necrosis: No Has patient had a PCN reaction that required hospitalization No Has patient had a PCN reaction occurring within the last 10 years: No If all of the above answers are "NO", then may proceed with Cephalosporin use.  .Marland Kitchen  Pineapple     Itchy throat  . Sulfa Antibiotics Hives    Facility-Administered Medications Prior to Admission  Medication Dose Route Frequency Provider Last Rate Last Dose  . TDaP (BOOSTRIX) injection 0.5 mL  0.5 mL Intramuscular Once Lanice Shirts, MD       Prescriptions Prior to Admission  Medication Sig Dispense Refill Last Dose  . acetaminophen (TYLENOL) 500 MG tablet Take 1,000 mg by mouth every 6 (six) hours as needed.   Taking  . Doxylamine-Pyridoxine (DICLEGIS PO) Take 2 tablets by mouth as needed.   Taking  . Prenatal Vit-Fe Fumarate-FA (PRENATAL VITAMIN PO) Take by mouth.   Taking    Review of Systems  Constitutional: Negative for chills and fever.  Gastrointestinal:  Positive for abdominal pain, diarrhea, nausea and vomiting. Negative for anal bleeding, blood in stool, constipation and rectal pain.  Genitourinary: Negative for dysuria, vaginal bleeding and vaginal discharge.   Physical Exam   Blood pressure (!) 93/46, pulse 102, temperature 98.2 F (36.8 C), temperature source Oral, resp. rate 18, weight 190 lb 4 oz (86.3 kg).  Physical Exam  Nursing note and vitals reviewed. Constitutional: She is oriented to person, place, and time. She appears well-developed and well-nourished. No distress.  HENT:  Head: Normocephalic and atraumatic.  Eyes: Conjunctivae are normal. Right eye exhibits no discharge. Left eye exhibits no discharge. No scleral icterus.  Neck: Normal range of motion.  Cardiovascular: Normal rate, regular rhythm and normal heart sounds.   No murmur heard. Respiratory: Effort normal and breath sounds normal. No respiratory distress. She has no wheezes.  GI: Soft. Bowel sounds are normal. There is no tenderness.  Neurological: She is alert and oriented to person, place, and time.  Skin: Skin is warm and dry. She is not diaphoretic.  Psychiatric: She has a normal mood and affect. Her behavior is normal. Judgment and thought content normal.   Dilation: Closed Effacement (%): Thick Cervical Position: Posterior Exam by:: Robyne Askew NP  Fetal Tracing:  Baseline: 150 Variability: moderate Accelerations: 10x10 Decelerations: none  Toco: none  MAU Course  Procedures Results for orders placed or performed during the hospital encounter of 08/05/16 (from the past 24 hour(s))  Urinalysis, Routine w reflex microscopic     Status: Abnormal   Collection Time: 08/05/16 10:30 AM  Result Value Ref Range   Color, Urine YELLOW YELLOW   APPearance CLEAR CLEAR   Specific Gravity, Urine 1.017 1.005 - 1.030   pH 6.0 5.0 - 8.0   Glucose, UA NEGATIVE NEGATIVE mg/dL   Hgb urine dipstick NEGATIVE NEGATIVE   Bilirubin Urine NEGATIVE NEGATIVE    Ketones, ur 20 (A) NEGATIVE mg/dL   Protein, ur NEGATIVE NEGATIVE mg/dL   Nitrite NEGATIVE NEGATIVE   Leukocytes, UA NEGATIVE NEGATIVE  CBC     Status: Abnormal   Collection Time: 08/05/16  1:26 PM  Result Value Ref Range   WBC 9.1 4.0 - 10.5 K/uL   RBC 3.34 (L) 3.87 - 5.11 MIL/uL   Hemoglobin 10.4 (L) 12.0 - 15.0 g/dL   HCT 29.8 (L) 36.0 - 46.0 %   MCV 89.2 78.0 - 100.0 fL   MCH 31.1 26.0 - 34.0 pg   MCHC 34.9 30.0 - 36.0 g/dL   RDW 14.5 11.5 - 15.5 %   Platelets 129 (L) 150 - 400 K/uL  Comprehensive metabolic panel     Status: Abnormal   Collection Time: 08/05/16  1:26 PM  Result Value Ref Range   Sodium 135 135 -  145 mmol/L   Potassium 3.7 3.5 - 5.1 mmol/L   Chloride 105 101 - 111 mmol/L   CO2 23 22 - 32 mmol/L   Glucose, Bld 92 65 - 99 mg/dL   BUN 7 6 - 20 mg/dL   Creatinine, Ser 0.41 (L) 0.44 - 1.00 mg/dL   Calcium 8.4 (L) 8.9 - 10.3 mg/dL   Total Protein 6.7 6.5 - 8.1 g/dL   Albumin 2.8 (L) 3.5 - 5.0 g/dL   AST 20 15 - 41 U/L   ALT 15 14 - 54 U/L   Alkaline Phosphatase 68 38 - 126 U/L   Total Bilirubin 0.6 0.3 - 1.2 mg/dL   GFR calc non Af Amer >60 >60 mL/min   GFR calc Af Amer >60 >60 mL/min   Anion gap 7 5 - 15    MDM Category 1 tracing No ctx on toco & cervix closed Phenergan 25 mg in bag of LR followed by 2nd bag of LR Pepcid 20 mg IV Zofran 4 mg IV Pt reports improvement in symptoms & able to tolerate PO challenge No vomiting or diarrhea during MAU visit S/w Dr. Gaetano Net. Notified of s/s, tx, & labs. Ok to discharge home  Assessment and Plan  A: 1. Acute gastroenteritis    P: Discharge home Rx promethazine Take ranitidine daily  Advance diet as tolerated, bland foods Good hand hygiene & don't prepare food for others Keep f/u with OB  Jorje Guild 08/05/2016, 11:18 AM

## 2016-08-05 NOTE — MAU Note (Signed)
Nausea, vomiting and diarrhea- woke her at 0200. Is cramping. No bleeding or leaking

## 2016-08-12 DIAGNOSIS — D649 Anemia, unspecified: Secondary | ICD-10-CM | POA: Diagnosis not present

## 2016-09-05 ENCOUNTER — Encounter (HOSPITAL_COMMUNITY)
Admission: RE | Admit: 2016-09-05 | Discharge: 2016-09-05 | Disposition: A | Discharge status: Home / Self Care | Payer: BC Managed Care – PPO | Source: Ambulatory Visit | Attending: Anesthesiology | Admitting: Anesthesiology

## 2016-09-30 DIAGNOSIS — Z3480 Encounter for supervision of other normal pregnancy, unspecified trimester: Secondary | ICD-10-CM | POA: Diagnosis not present

## 2016-10-08 DIAGNOSIS — E7522 Gaucher disease: Secondary | ICD-10-CM | POA: Diagnosis not present

## 2016-10-08 DIAGNOSIS — Z36 Encounter for antenatal screening for chromosomal anomalies: Secondary | ICD-10-CM | POA: Diagnosis not present

## 2016-10-09 ENCOUNTER — Encounter (HOSPITAL_COMMUNITY): Payer: Self-pay | Admitting: *Deleted

## 2016-10-09 ENCOUNTER — Telehealth (HOSPITAL_COMMUNITY): Payer: Self-pay | Admitting: *Deleted

## 2016-10-09 LAB — OB RESULTS CONSOLE GBS: STREP GROUP B AG: NEGATIVE

## 2016-10-09 NOTE — Telephone Encounter (Signed)
Preadmission screen  

## 2016-10-12 NOTE — H&P (Signed)
Kelly Navarro is a 31 y.o. female presenting for ECV; fetal head on maternal left.  The patient was counseled re: options including exp management vs ECV vs C/S and elects to proceed with ECV.  Antepartum course complicated by Gaucher's Dz; s/p MFM consult.  Her labs have been normal this pregnancy.  Additionally, she was found to have a bicornuate vs septate uterus early this pregnancy.  The fetus was vertex one week prior to diagnosing malpresentation.  GBS negative.  OB History    Gravida Para Term Preterm AB Living   1             SAB TAB Ectopic Multiple Live Births                 Past Medical History:  Diagnosis Date  . Bee sting allergy   . 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 Eczema   . Gaucher disease, type I ([place])    noted on [facility] genetic testing 05/2015  . GERD (gastroesophageal reflux disease)   . Headache(784.0)    menstrual   . IBS (irritable bowel syndrome)   . Scoliosis    very mild   Past Surgical History:  Procedure Laterality Date  . GUM SURGERY    . WISDOM TOOTH EXTRACTION     Family History: family history includes Asthma in her sister; Breast cancer in her paternal grandmother; Cancer in her paternal grandfather; Diabetes in her maternal grandfather; Eczema in her sister; Heart disease in her maternal grandfather; Hypertension in her maternal grandfather and mother. Social History:  reports that she has never smoked. She has never used smokeless tobacco. She reports that she drinks about 0.6 oz of alcohol per week . She reports that she does not use drugs.     Maternal Diabetes: No Genetic Screening: Normal Maternal Ultrasounds/Referrals: Normal Fetal Ultrasounds or other Referrals:  None Maternal Substance Abuse:  No Significant Maternal Medications:  None Significant Maternal Lab Results:  Lab values include: Group B Strep  negative Other Comments:  None  ROS Maternal Medical History:  Prenatal Complications - Diabetes: none.      There were no vitals taken for this visit. Maternal Exam:  Abdomen: Patient reports no abdominal tenderness. Fundal height is c/w dates.   Estimated fetal weight is 7#.       Physical Exam  Constitutional: She is oriented to person, place, and time. She appears well-developed and well-nourished.  GI: Soft. There is no rebound and no guarding.  Neurological: She is alert and oriented to person, place, and time.  Skin: Skin is warm and dry.  Psychiatric: She has a normal mood and affect. Her behavior is normal.    Prenatal labs: ABO, Rh: AB/Positive/-- (09/14 0000) Antibody: Negative (09/14 0000) Rubella: Immune (09/14 0000) RPR: Nonreactive (09/14 0000)  HBsAg: Negative (09/14 0000)  HIV: Non-reactive (09/14 0000)  GBS: Negative (04/12 0000)   Assessment/Plan: 31yo G1 at 38w for ECV -Patient has been counseled re: risk of failure, return to malpresentation, and fetal distress.  All questions were answered and the patient wishes to procced.   Drisana Schweickert 10/12/2016, 8:56 PM

## 2016-10-13 ENCOUNTER — Observation Stay (HOSPITAL_COMMUNITY)
Admission: RE | Admit: 2016-10-13 | Discharge: 2016-10-13 | Disposition: A | Discharge status: Home / Self Care | Payer: BC Managed Care – PPO | Source: Ambulatory Visit | Attending: Obstetrics & Gynecology | Admitting: Obstetrics & Gynecology

## 2016-10-13 ENCOUNTER — Encounter (HOSPITAL_COMMUNITY): Payer: Self-pay

## 2016-10-13 DIAGNOSIS — O26893 Other specified pregnancy related conditions, third trimester: Secondary | ICD-10-CM | POA: Diagnosis not present

## 2016-10-13 DIAGNOSIS — O3403 Maternal care for unspecified congenital malformation of uterus, third trimester: Secondary | ICD-10-CM | POA: Insufficient documentation

## 2016-10-13 DIAGNOSIS — Z148 Genetic carrier of other disease: Secondary | ICD-10-CM | POA: Insufficient documentation

## 2016-10-13 DIAGNOSIS — E7522 Gaucher disease: Secondary | ICD-10-CM | POA: Insufficient documentation

## 2016-10-13 DIAGNOSIS — Z3A38 38 weeks gestation of pregnancy: Secondary | ICD-10-CM | POA: Insufficient documentation

## 2016-10-13 DIAGNOSIS — O321XX0 Maternal care for breech presentation, not applicable or unspecified: Principal | ICD-10-CM | POA: Insufficient documentation

## 2016-10-13 DIAGNOSIS — Q519 Congenital malformation of uterus and cervix, unspecified: Secondary | ICD-10-CM | POA: Diagnosis not present

## 2016-10-13 DIAGNOSIS — Z91030 Bee allergy status: Secondary | ICD-10-CM | POA: Insufficient documentation

## 2016-10-13 DIAGNOSIS — O322XX0 Maternal care for transverse and oblique lie, not applicable or unspecified: Secondary | ICD-10-CM | POA: Diagnosis present

## 2016-10-13 LAB — CBC
HCT: 36.3 % (ref 36.0–46.0)
Hemoglobin: 12.2 g/dL (ref 12.0–15.0)
MCH: 31.2 pg (ref 26.0–34.0)
MCHC: 33.6 g/dL (ref 30.0–36.0)
MCV: 92.8 fL (ref 78.0–100.0)
PLATELETS: 155 10*3/uL (ref 150–400)
RBC: 3.91 MIL/uL (ref 3.87–5.11)
RDW: 14.8 % (ref 11.5–15.5)
WBC: 7.8 10*3/uL (ref 4.0–10.5)

## 2016-10-13 LAB — ABO/RH: ABO/RH(D): AB POS

## 2016-10-13 LAB — TYPE AND SCREEN
ABO/RH(D): AB POS
Antibody Screen: NEGATIVE

## 2016-10-13 MED ORDER — SOD CITRATE-CITRIC ACID 500-334 MG/5ML PO SOLN
30.0000 mL | ORAL | Status: DC | PRN
Start: 2016-10-13 — End: 2016-10-13

## 2016-10-13 MED ORDER — LIDOCAINE HCL (PF) 1 % IJ SOLN: 30.0000 mL | INTRAMUSCULAR | Status: DC | PRN

## 2016-10-13 MED ORDER — ACETAMINOPHEN 325 MG PO TABS: 650.0000 mg | ORAL_TABLET | ORAL | Status: DC | PRN

## 2016-10-13 MED ORDER — LACTATED RINGERS IV SOLN: INTRAVENOUS | Status: DC

## 2016-10-13 MED ORDER — TERBUTALINE SULFATE 1 MG/ML IJ SOLN
0.2500 mg | Freq: Once | INTRAMUSCULAR | Status: AC
  Administered 2016-10-13: 0.25 mg via SUBCUTANEOUS
  Filled 2016-10-13: qty 1

## 2016-10-13 MED ORDER — OXYTOCIN BOLUS FROM INFUSION: 500.0000 mL | Freq: Once | INTRAVENOUS | Status: DC

## 2016-10-13 MED ORDER — OXYTOCIN 40 UNITS IN LACTATED RINGERS INFUSION - SIMPLE MED: 2.5000 [IU]/h | INTRAVENOUS | Status: DC

## 2016-10-13 MED ORDER — FLEET ENEMA 7-19 GM/118ML RE ENEM: 1.0000 | ENEMA | RECTAL | Status: DC | PRN

## 2016-10-13 MED ORDER — OXYCODONE-ACETAMINOPHEN 5-325 MG PO TABS: 1.0000 | ORAL_TABLET | ORAL | Status: DC | PRN

## 2016-10-13 MED ORDER — OXYCODONE-ACETAMINOPHEN 5-325 MG PO TABS: 2.0000 | ORAL_TABLET | ORAL | Status: DC | PRN

## 2016-10-13 MED ORDER — ONDANSETRON HCL 4 MG/2ML IJ SOLN: 4.0000 mg | Freq: Four times a day (QID) | INTRAMUSCULAR | Status: DC | PRN

## 2016-10-13 MED ORDER — LACTATED RINGERS IV SOLN: 500.0000 mL | INTRAVENOUS | Status: DC | PRN

## 2016-10-13 NOTE — Discharge Instructions (Signed)

## 2016-10-13 NOTE — Op Note (Signed)
External Cephalic Version  Pt was confirmed breech by ultrasound today.  She desires attempt at ECV.  Risks and benefits have been discussed at length and informed consent was obtained. EFM with reactive tracing and patient was given terbutaline .  SQ.  IV had been placed and Ultrasound at bedside.  Fetal vertex in RUQ was confirmed and fetal buttocks was elevated out of the pelvis.  In a counterclockwise fashion, ECV was attempted but not successful.  Clockwise attempt was also performed but also not successful.  Pt and fetus tolerated the procedure well.  EFM was reassuring immediately after the procedure. Plan to monitor EFM for 1 hour and if remains reassuring will discharge the patient home with plans to schedule primary C/S.  Follow up in the office as scheduled Thursday.

## 2016-10-14 LAB — RPR: RPR Ser Ql: NONREACTIVE

## 2016-10-20 ENCOUNTER — Encounter (HOSPITAL_COMMUNITY): Payer: Self-pay | Admitting: Anesthesiology

## 2016-10-20 ENCOUNTER — Inpatient Hospital Stay (HOSPITAL_COMMUNITY)
Admission: AD | Admit: 2016-10-20 | Discharge: 2016-10-24 | DRG: 765 | Disposition: A | Discharge status: Home / Self Care | Payer: BC Managed Care – PPO | Source: Ambulatory Visit | Attending: Obstetrics & Gynecology | Admitting: Obstetrics & Gynecology

## 2016-10-20 ENCOUNTER — Encounter (HOSPITAL_COMMUNITY)
Admission: RE | Admit: 2016-10-20 | Discharge: 2016-10-20 | Disposition: A | Discharge status: Home / Self Care | Payer: BC Managed Care – PPO | Source: Ambulatory Visit | Attending: Obstetrics & Gynecology | Admitting: Obstetrics & Gynecology

## 2016-10-20 DIAGNOSIS — E669 Obesity, unspecified: Secondary | ICD-10-CM | POA: Diagnosis not present

## 2016-10-20 DIAGNOSIS — O9962 Diseases of the digestive system complicating childbirth: Secondary | ICD-10-CM | POA: Diagnosis not present

## 2016-10-20 DIAGNOSIS — O3403 Maternal care for unspecified congenital malformation of uterus, third trimester: Secondary | ICD-10-CM | POA: Diagnosis not present

## 2016-10-20 DIAGNOSIS — O328XX0 Maternal care for other malpresentation of fetus, not applicable or unspecified: Secondary | ICD-10-CM | POA: Diagnosis not present

## 2016-10-20 DIAGNOSIS — K219 Gastro-esophageal reflux disease without esophagitis: Secondary | ICD-10-CM | POA: Diagnosis present

## 2016-10-20 DIAGNOSIS — O99214 Obesity complicating childbirth: Secondary | ICD-10-CM | POA: Diagnosis not present

## 2016-10-20 DIAGNOSIS — Z8249 Family history of ischemic heart disease and other diseases of the circulatory system: Secondary | ICD-10-CM | POA: Diagnosis not present

## 2016-10-20 DIAGNOSIS — Z833 Family history of diabetes mellitus: Secondary | ICD-10-CM | POA: Diagnosis not present

## 2016-10-20 DIAGNOSIS — Z23 Encounter for immunization: Secondary | ICD-10-CM | POA: Diagnosis not present

## 2016-10-20 DIAGNOSIS — Q51810 Arcuate uterus: Secondary | ICD-10-CM | POA: Diagnosis not present

## 2016-10-20 DIAGNOSIS — O321XX0 Maternal care for breech presentation, not applicable or unspecified: Secondary | ICD-10-CM | POA: Diagnosis present

## 2016-10-20 DIAGNOSIS — Z3A39 39 weeks gestation of pregnancy: Secondary | ICD-10-CM

## 2016-10-20 DIAGNOSIS — Z6831 Body mass index (BMI) 31.0-31.9, adult: Secondary | ICD-10-CM

## 2016-10-20 DIAGNOSIS — D681 Hereditary factor XI deficiency: Secondary | ICD-10-CM | POA: Diagnosis present

## 2016-10-20 LAB — CBC
HCT: 38.7 % (ref 36.0–46.0)
HEMOGLOBIN: 13.3 g/dL (ref 12.0–15.0)
MCH: 31.7 pg (ref 26.0–34.0)
MCHC: 34.4 g/dL (ref 30.0–36.0)
MCV: 92.4 fL (ref 78.0–100.0)
Platelets: 160 10*3/uL (ref 150–400)
RBC: 4.19 MIL/uL (ref 3.87–5.11)
RDW: 14.6 % (ref 11.5–15.5)
WBC: 9.1 10*3/uL (ref 4.0–10.5)

## 2016-10-20 LAB — TYPE AND SCREEN
ABO/RH(D): AB POS
Antibody Screen: NEGATIVE

## 2016-10-20 NOTE — Patient Instructions (Signed)
20 Kelly Navarro  10/20/2016   Your procedure is scheduled on:  10/21/2016  Enter through the Main Entrance of [facility] at 1100 AM.  Pick up the phone at the desk and dial [phone].   Call this number if you have problems the morning of surgery: [phone]   Remember:   Do not eat food:After Midnight.  Do not drink clear liquids: After Midnight.  Take these medicines the morning of surgery with A SIP OF WATER: none   Do not wear jewelry, make-up or nail polish.  Do not wear lotions, powders, or perfumes. Do not wear deodorant.  Do not shave 48 hours prior to surgery.  Do not bring valuables to the hospital.  [facility] is not   responsible for any belongings or valuables brought to the hospital.  Contacts, dentures or bridgework may not be worn into surgery.  Leave suitcase in the car. After surgery it may be brought to your room.  For patients admitted to the hospital, checkout time is 11:00 AM the day of              discharge.   Patients discharged the day of surgery will not be allowed to drive             home.  Name and phone number of your driver: na  Special Instructions:   N/A   Please read over the following fact sheets that you were given:   Surgical Site Infection Prevention

## 2016-10-20 NOTE — H&P (Signed)
Kelly Navarro is a 31 y.o. female presenting for cesarean section for breech. 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 MFM consult done. Labs have been normal. Failed ECV now for C/S. OB History    Gravida Para Term Preterm AB Living   1             SAB TAB Ectopic Multiple Live Births                 Past Medical History:  Diagnosis Date  . Bee sting allergy   . 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 Eczema   . Gaucher disease, type I (HCC)    noted on [facility] genetic testing 05/2015  . GERD (gastroesophageal reflux disease)   . Headache(784.0)    menstrual   . IBS (irritable bowel syndrome)   . Scoliosis    very mild   Past Surgical History:  Procedure Laterality Date  . GUM SURGERY    . WISDOM TOOTH EXTRACTION     Family History: family history includes Asthma in her sister; Breast cancer in her paternal grandmother; Cancer in her paternal grandfather; Diabetes in her maternal grandfather; Eczema in her sister; Heart disease in her maternal grandfather; Hypertension in her maternal grandfather and mother. Social History:  reports that she has never smoked. She has never used smokeless tobacco. She reports that she drinks about 0.6 oz of alcohol per week . She reports that she does not use drugs.     Maternal Diabetes: No Genetic Screening: Abnormal:  Results: Other:see above Maternal Ultrasounds/Referrals: Normal Fetal Ultrasounds or other Referrals:  Referred to Materal Fetal Medicine  Maternal Substance Abuse:  No Significant Maternal Medications:  None Significant Maternal Lab Results:  None Other Comments:  see above  Review of Systems  Eyes: Negative for blurred vision.  Gastrointestinal: Negative for abdominal pain.  Neurological:  Negative for headaches.   History   There were no vitals taken for this visit. Exam Physical Exam  Cardiovascular: Normal rate and regular rhythm.   Respiratory: Effort normal and breath sounds normal.  GI: Soft. There is no tenderness.  Neurological: She has normal reflexes.    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

## 2016-10-20 NOTE — Anesthesia Preprocedure Evaluation (Addendum)
Anesthesia Evaluation  Patient identified by MRN, date of birth, ID band Patient awake    Reviewed: Allergy & Precautions, NPO status , Patient's Chart, lab work & pertinent test results  Airway Mallampati: I       Dental no notable dental hx.    Pulmonary neg pulmonary ROS,    Pulmonary exam normal        Cardiovascular negative cardio ROS Normal cardiovascular exam     Neuro/Psych  Headaches, negative psych ROS   GI/Hepatic Neg liver ROS, GERD  Medicated and Controlled,IBS   Endo/Other  Obesity Carrier for 21-hydroxylase deficiency    Renal/GU negative Renal ROS  negative genitourinary   Musculoskeletal  (+) Arthritis , Scoliosis Gaucher's disease type 1 - homozygous Eczema sacroilitis      Abdominal (+) + obese,   Peds  Hematology  (+) Blood dyscrasia, , Carrier for Factor XI deficiency   Anesthesia Other Findings   Reproductive/Obstetrics (+) Pregnancy                           Anesthesia Physical Anesthesia Plan  ASA: III  Anesthesia Plan: Spinal   Post-op Pain Management:    Induction:   Airway Management Planned: Natural Airway  Additional Equipment:   Intra-op Plan:   Post-operative Plan:   Informed Consent: I have reviewed the patients History and Physical, chart, labs and discussed the procedure including the risks, benefits and alternatives for the proposed anesthesia with the patient or authorized representative who has indicated his/her understanding and acceptance.   Dental advisory given  Plan Discussed with: Anesthesiologist, CRNA and Surgeon  Anesthesia Plan Comments:         Anesthesia Quick Evaluation

## 2016-10-21 ENCOUNTER — Encounter (HOSPITAL_COMMUNITY): Payer: Self-pay | Admitting: *Deleted

## 2016-10-21 ENCOUNTER — Inpatient Hospital Stay (HOSPITAL_COMMUNITY)
Admission: RE | Admit: 2016-10-21 | Payer: BC Managed Care – PPO | Source: Ambulatory Visit | Admitting: Obstetrics & Gynecology

## 2016-10-21 ENCOUNTER — Encounter (HOSPITAL_COMMUNITY)
Admission: AD | Disposition: A | Discharge status: Home / Self Care | Payer: Self-pay | Source: Ambulatory Visit | Attending: Obstetrics & Gynecology

## 2016-10-21 ENCOUNTER — Inpatient Hospital Stay (HOSPITAL_COMMUNITY): Payer: BC Managed Care – PPO | Admitting: Anesthesiology

## 2016-10-21 ENCOUNTER — Encounter: Payer: Self-pay | Admitting: Family Medicine

## 2016-10-21 DIAGNOSIS — O9962 Diseases of the digestive system complicating childbirth: Secondary | ICD-10-CM | POA: Diagnosis present

## 2016-10-21 DIAGNOSIS — Q51810 Arcuate uterus: Secondary | ICD-10-CM | POA: Diagnosis not present

## 2016-10-21 DIAGNOSIS — D681 Hereditary factor XI deficiency: Secondary | ICD-10-CM | POA: Diagnosis present

## 2016-10-21 DIAGNOSIS — O99214 Obesity complicating childbirth: Secondary | ICD-10-CM | POA: Diagnosis present

## 2016-10-21 DIAGNOSIS — O3403 Maternal care for unspecified congenital malformation of uterus, third trimester: Secondary | ICD-10-CM | POA: Diagnosis present

## 2016-10-21 DIAGNOSIS — O321XX0 Maternal care for breech presentation, not applicable or unspecified: Secondary | ICD-10-CM | POA: Diagnosis present

## 2016-10-21 DIAGNOSIS — Z98891 History of uterine scar from previous surgery: Secondary | ICD-10-CM

## 2016-10-21 DIAGNOSIS — E669 Obesity, unspecified: Secondary | ICD-10-CM | POA: Diagnosis present

## 2016-10-21 DIAGNOSIS — K219 Gastro-esophageal reflux disease without esophagitis: Secondary | ICD-10-CM | POA: Diagnosis present

## 2016-10-21 DIAGNOSIS — Z8249 Family history of ischemic heart disease and other diseases of the circulatory system: Secondary | ICD-10-CM | POA: Diagnosis not present

## 2016-10-21 DIAGNOSIS — Z3A39 39 weeks gestation of pregnancy: Secondary | ICD-10-CM | POA: Diagnosis not present

## 2016-10-21 DIAGNOSIS — O328XX0 Maternal care for other malpresentation of fetus, not applicable or unspecified: Secondary | ICD-10-CM | POA: Diagnosis present

## 2016-10-21 DIAGNOSIS — Z833 Family history of diabetes mellitus: Secondary | ICD-10-CM | POA: Diagnosis not present

## 2016-10-21 DIAGNOSIS — Z6831 Body mass index (BMI) 31.0-31.9, adult: Secondary | ICD-10-CM | POA: Diagnosis not present

## 2016-10-21 LAB — CBC
HEMATOCRIT: 40.5 % (ref 36.0–46.0)
Hemoglobin: 13.9 g/dL (ref 12.0–15.0)
MCH: 31.4 pg (ref 26.0–34.0)
MCHC: 34.3 g/dL (ref 30.0–36.0)
MCV: 91.6 fL (ref 78.0–100.0)
Platelets: 173 10*3/uL (ref 150–400)
RBC: 4.42 MIL/uL (ref 3.87–5.11)
RDW: 14.5 % (ref 11.5–15.5)
WBC: 11 10*3/uL — AB (ref 4.0–10.5)

## 2016-10-21 LAB — RPR
RPR: NONREACTIVE
RPR: NONREACTIVE

## 2016-10-21 SURGERY — Surgical Case
Anesthesia: Spinal

## 2016-10-21 MED ORDER — OXYCODONE-ACETAMINOPHEN 5-325 MG PO TABS
2.0000 | ORAL_TABLET | ORAL | Status: DC | PRN
  Administered 2016-10-21 – 2016-10-23 (×8): 2 via ORAL
  Filled 2016-10-21 (×8): qty 2

## 2016-10-21 MED ORDER — OXYCODONE-ACETAMINOPHEN 5-325 MG PO TABS
1.0000 | ORAL_TABLET | ORAL | Status: DC | PRN
  Administered 2016-10-22 – 2016-10-24 (×6): 1 via ORAL
  Filled 2016-10-21 (×7): qty 1

## 2016-10-21 MED ORDER — FAMOTIDINE IN NACL 20-0.9 MG/50ML-% IV SOLN: 20.0000 mg | Freq: Once | INTRAVENOUS | Status: AC

## 2016-10-21 MED ORDER — FAMOTIDINE IN NACL 20-0.9 MG/50ML-% IV SOLN
INTRAVENOUS | Status: AC
  Administered 2016-10-21: 20 mg
  Filled 2016-10-21: qty 50

## 2016-10-21 MED ORDER — DIPHENHYDRAMINE HCL 25 MG PO CAPS: 25.0000 mg | ORAL_CAPSULE | Freq: Four times a day (QID) | ORAL | Status: DC | PRN

## 2016-10-21 MED ORDER — IBUPROFEN 600 MG PO TABS: 600.0000 mg | ORAL_TABLET | Freq: Four times a day (QID) | ORAL | Status: DC | PRN

## 2016-10-21 MED ORDER — NALBUPHINE HCL 10 MG/ML IJ SOLN: 5.0000 mg | Freq: Once | INTRAMUSCULAR | Status: DC | PRN

## 2016-10-21 MED ORDER — OXYTOCIN 40 UNITS IN LACTATED RINGERS INFUSION - SIMPLE MED: 2.5000 [IU]/h | INTRAVENOUS | Status: AC

## 2016-10-21 MED ORDER — ZOLPIDEM TARTRATE 5 MG PO TABS: 5.0000 mg | ORAL_TABLET | Freq: Every evening | ORAL | Status: DC | PRN

## 2016-10-21 MED ORDER — TERBUTALINE SULFATE 1 MG/ML IJ SOLN
0.2500 mg | Freq: Once | INTRAMUSCULAR | Status: AC
  Administered 2016-10-21: 0.25 mg via SUBCUTANEOUS

## 2016-10-21 MED ORDER — FENTANYL CITRATE (PF) 100 MCG/2ML IJ SOLN
INTRAMUSCULAR | Status: DC | PRN
  Administered 2016-10-21: 20 ug via INTRATHECAL

## 2016-10-21 MED ORDER — TERBUTALINE SULFATE 1 MG/ML IJ SOLN
INTRAMUSCULAR | Status: AC
  Filled 2016-10-21: qty 1

## 2016-10-21 MED ORDER — FENTANYL CITRATE (PF) 100 MCG/2ML IJ SOLN
INTRAMUSCULAR | Status: AC
  Filled 2016-10-21: qty 4

## 2016-10-21 MED ORDER — ONDANSETRON HCL 4 MG/2ML IJ SOLN: 4.0000 mg | Freq: Three times a day (TID) | INTRAMUSCULAR | Status: DC | PRN

## 2016-10-21 MED ORDER — GENTAMICIN SULFATE 40 MG/ML IJ SOLN
INTRAVENOUS | Status: DC
  Filled 2016-10-21: qty 9.5

## 2016-10-21 MED ORDER — MORPHINE SULFATE (PF) 0.5 MG/ML IJ SOLN
INTRAMUSCULAR | Status: AC
  Filled 2016-10-21: qty 10

## 2016-10-21 MED ORDER — LACTATED RINGERS IV BOLUS (SEPSIS): 1000.0000 mL | Freq: Once | INTRAVENOUS | Status: DC

## 2016-10-21 MED ORDER — ONDANSETRON HCL 4 MG/2ML IJ SOLN
INTRAMUSCULAR | Status: DC | PRN
  Administered 2016-10-21: 4 mg via INTRAVENOUS

## 2016-10-21 MED ORDER — NALBUPHINE HCL 10 MG/ML IJ SOLN: 5.0000 mg | INTRAMUSCULAR | Status: DC | PRN

## 2016-10-21 MED ORDER — OXYTOCIN 10 UNIT/ML IJ SOLN
INTRAMUSCULAR | Status: AC
  Filled 2016-10-21: qty 1

## 2016-10-21 MED ORDER — LACTATED RINGERS IV SOLN
INTRAVENOUS | Status: DC
  Administered 2016-10-21 (×2): via INTRAVENOUS

## 2016-10-21 MED ORDER — KETOROLAC TROMETHAMINE 30 MG/ML IJ SOLN: 30.0000 mg | Freq: Four times a day (QID) | INTRAMUSCULAR | Status: DC | PRN

## 2016-10-21 MED ORDER — NALOXONE HCL 0.4 MG/ML IJ SOLN: 0.4000 mg | INTRAMUSCULAR | Status: DC | PRN

## 2016-10-21 MED ORDER — TETANUS-DIPHTH-ACELL PERTUSSIS 5-2.5-18.5 LF-MCG/0.5 IM SUSP: 0.5000 mL | Freq: Once | INTRAMUSCULAR | Status: DC

## 2016-10-21 MED ORDER — SOD CITRATE-CITRIC ACID 500-334 MG/5ML PO SOLN
ORAL | Status: AC
  Filled 2016-10-21: qty 15

## 2016-10-21 MED ORDER — WITCH HAZEL-GLYCERIN EX PADS: 1.0000 "application " | MEDICATED_PAD | CUTANEOUS | Status: DC | PRN

## 2016-10-21 MED ORDER — HYDROMORPHONE HCL 1 MG/ML IJ SOLN: 0.2500 mg | INTRAMUSCULAR | Status: DC | PRN

## 2016-10-21 MED ORDER — LACTATED RINGERS IV SOLN: INTRAVENOUS | Status: DC

## 2016-10-21 MED ORDER — SODIUM CHLORIDE 0.9 % IR SOLN
Status: DC | PRN
  Administered 2016-10-21: 1

## 2016-10-21 MED ORDER — KETOROLAC TROMETHAMINE 30 MG/ML IJ SOLN: 30.0000 mg | Freq: Once | INTRAMUSCULAR | Status: DC | PRN

## 2016-10-21 MED ORDER — DIPHENHYDRAMINE HCL 25 MG PO CAPS
25.0000 mg | ORAL_CAPSULE | ORAL | Status: DC | PRN
  Filled 2016-10-21: qty 1

## 2016-10-21 MED ORDER — SODIUM CHLORIDE 0.9% FLUSH: 3.0000 mL | INTRAVENOUS | Status: DC | PRN

## 2016-10-21 MED ORDER — SIMETHICONE 80 MG PO CHEW
80.0000 mg | CHEWABLE_TABLET | Freq: Three times a day (TID) | ORAL | Status: DC
  Administered 2016-10-21 – 2016-10-23 (×8): 80 mg via ORAL
  Filled 2016-10-21 (×8): qty 1

## 2016-10-21 MED ORDER — SOD CITRATE-CITRIC ACID 500-334 MG/5ML PO SOLN
30.0000 mL | ORAL | Status: DC
  Filled 2016-10-21: qty 30

## 2016-10-21 MED ORDER — DEXAMETHASONE SODIUM PHOSPHATE 10 MG/ML IJ SOLN
INTRAMUSCULAR | Status: DC | PRN
Start: 2016-10-21 — End: 2016-10-21
  Administered 2016-10-21: 10 mg via INTRAVENOUS

## 2016-10-21 MED ORDER — COCONUT OIL OIL
1.0000 "application " | TOPICAL_OIL | Status: DC | PRN
  Administered 2016-10-21: 1 via TOPICAL
  Filled 2016-10-21: qty 120

## 2016-10-21 MED ORDER — PHENYLEPHRINE 8 MG IN D5W 100 ML (0.08MG/ML) PREMIX OPTIME
INJECTION | INTRAVENOUS | Status: DC | PRN
  Administered 2016-10-21: 60 ug/min via INTRAVENOUS

## 2016-10-21 MED ORDER — MENTHOL 3 MG MT LOZG: 1.0000 | LOZENGE | OROMUCOSAL | Status: DC | PRN

## 2016-10-21 MED ORDER — LACTATED RINGERS IV SOLN
INTRAVENOUS | Status: DC | PRN
  Administered 2016-10-21: 40 [IU] via INTRAVENOUS

## 2016-10-21 MED ORDER — SOD CITRATE-CITRIC ACID 500-334 MG/5ML PO SOLN
30.0000 mL | Freq: Once | ORAL | Status: AC
  Administered 2016-10-21: 30 mL via ORAL

## 2016-10-21 MED ORDER — DIBUCAINE 1 % RE OINT
1.0000 | TOPICAL_OINTMENT | RECTAL | Status: DC | PRN
Start: 2016-10-21 — End: 2016-10-24

## 2016-10-21 MED ORDER — DEXTROSE 5 % IV SOLN
1.0000 ug/kg/h | INTRAVENOUS | Status: DC | PRN
  Filled 2016-10-21: qty 2

## 2016-10-21 MED ORDER — MEPERIDINE HCL 25 MG/ML IJ SOLN: 6.2500 mg | INTRAMUSCULAR | Status: DC | PRN

## 2016-10-21 MED ORDER — IBUPROFEN 600 MG PO TABS
600.0000 mg | ORAL_TABLET | Freq: Four times a day (QID) | ORAL | Status: DC
  Administered 2016-10-21 – 2016-10-24 (×12): 600 mg via ORAL
  Filled 2016-10-21 (×13): qty 1

## 2016-10-21 MED ORDER — LACTATED RINGERS IV SOLN
INTRAVENOUS | Status: DC
  Administered 2016-10-21: 01:00:00 via INTRAVENOUS

## 2016-10-21 MED ORDER — PROMETHAZINE HCL 25 MG/ML IJ SOLN: 6.2500 mg | INTRAMUSCULAR | Status: DC | PRN

## 2016-10-21 MED ORDER — BUPIVACAINE IN DEXTROSE 0.75-8.25 % IT SOLN
INTRATHECAL | Status: AC
  Filled 2016-10-21: qty 2

## 2016-10-21 MED ORDER — GENTAMICIN SULFATE 40 MG/ML IJ SOLN: INTRAVENOUS | Status: DC

## 2016-10-21 MED ORDER — BUPIVACAINE IN DEXTROSE 0.75-8.25 % IT SOLN
INTRATHECAL | Status: DC | PRN
Start: 2016-10-21 — End: 2016-10-21
  Administered 2016-10-21: 1.4 mL via INTRATHECAL

## 2016-10-21 MED ORDER — MEPERIDINE HCL 25 MG/ML IJ SOLN
INTRAMUSCULAR | Status: AC
  Filled 2016-10-21: qty 1

## 2016-10-21 MED ORDER — MEPERIDINE HCL 25 MG/ML IJ SOLN
INTRAMUSCULAR | Status: DC | PRN
  Administered 2016-10-21 (×2): 12.5 mg via INTRAVENOUS

## 2016-10-21 MED ORDER — GENTAMICIN SULFATE 40 MG/ML IJ SOLN: INTRAVENOUS | Status: DC | PRN

## 2016-10-21 MED ORDER — DEXAMETHASONE SODIUM PHOSPHATE 10 MG/ML IJ SOLN
INTRAMUSCULAR | Status: AC
  Filled 2016-10-21: qty 1

## 2016-10-21 MED ORDER — PRENATAL MULTIVITAMIN CH
1.0000 | ORAL_TABLET | Freq: Every day | ORAL | Status: DC
  Administered 2016-10-22 – 2016-10-23 (×2): 1 via ORAL
  Filled 2016-10-21 (×3): qty 1

## 2016-10-21 MED ORDER — CLINDAMYCIN PHOSPHATE 900 MG/6ML IJ SOLN
INTRAMUSCULAR | Status: DC | PRN
  Administered 2016-10-21: 115.5 mL via INTRAVENOUS

## 2016-10-21 MED ORDER — SCOPOLAMINE 1 MG/3DAYS TD PT72
1.0000 | MEDICATED_PATCH | Freq: Once | TRANSDERMAL | Status: AC
  Administered 2016-10-21: 1 via TRANSDERMAL

## 2016-10-21 MED ORDER — DIPHENHYDRAMINE HCL 50 MG/ML IJ SOLN: 12.5000 mg | INTRAMUSCULAR | Status: DC | PRN

## 2016-10-21 MED ORDER — SCOPOLAMINE 1 MG/3DAYS TD PT72
MEDICATED_PATCH | TRANSDERMAL | Status: AC
  Filled 2016-10-21: qty 1

## 2016-10-21 MED ORDER — LACTATED RINGERS IV SOLN
INTRAVENOUS | Status: DC | PRN
  Administered 2016-10-21: 03:00:00 via INTRAVENOUS

## 2016-10-21 MED ORDER — PHENYLEPHRINE 8 MG IN D5W 100 ML (0.08MG/ML) PREMIX OPTIME
INJECTION | INTRAVENOUS | Status: AC
  Filled 2016-10-21: qty 100

## 2016-10-21 MED ORDER — DEXTROSE 5 % IV SOLN: INTRAVENOUS | Status: DC | PRN

## 2016-10-21 MED ORDER — ACETAMINOPHEN 500 MG PO TABS: 1000.0000 mg | ORAL_TABLET | Freq: Four times a day (QID) | ORAL | Status: DC

## 2016-10-21 MED ORDER — SIMETHICONE 80 MG PO CHEW
80.0000 mg | CHEWABLE_TABLET | ORAL | Status: DC
  Administered 2016-10-22 – 2016-10-23 (×3): 80 mg via ORAL
  Filled 2016-10-21 (×3): qty 1

## 2016-10-21 MED ORDER — SIMETHICONE 80 MG PO CHEW: 80.0000 mg | CHEWABLE_TABLET | ORAL | Status: DC | PRN

## 2016-10-21 MED ORDER — ONDANSETRON HCL 4 MG/2ML IJ SOLN
INTRAMUSCULAR | Status: AC
  Filled 2016-10-21: qty 2

## 2016-10-21 MED ORDER — ACETAMINOPHEN 325 MG PO TABS
650.0000 mg | ORAL_TABLET | ORAL | Status: DC | PRN
  Administered 2016-10-21 (×2): 650 mg via ORAL
  Administered 2016-10-24: 325 mg via ORAL
  Filled 2016-10-21 (×3): qty 2

## 2016-10-21 MED ORDER — SENNOSIDES-DOCUSATE SODIUM 8.6-50 MG PO TABS
2.0000 | ORAL_TABLET | ORAL | Status: DC
  Administered 2016-10-22 – 2016-10-23 (×3): 2 via ORAL
  Filled 2016-10-21 (×3): qty 2

## 2016-10-21 MED ORDER — MORPHINE SULFATE (PF) 0.5 MG/ML IJ SOLN
INTRAMUSCULAR | Status: DC | PRN
  Administered 2016-10-21: .2 mg via INTRATHECAL

## 2016-10-21 SURGICAL SUPPLY — 36 items
ADH SKN CLS APL DERMABOND .7 (GAUZE/BANDAGES/DRESSINGS)
APL SKNCLS STERI-STRIP NONHPOA (GAUZE/BANDAGES/DRESSINGS) ×1
BENZOIN TINCTURE PRP APPL 2/3 (GAUZE/BANDAGES/DRESSINGS) ×2 IMPLANT
CHLORAPREP W/TINT 26ML (MISCELLANEOUS) ×2 IMPLANT
CLAMP CORD UMBIL (MISCELLANEOUS) IMPLANT
CLOTH BEACON ORANGE TIMEOUT ST (SAFETY) ×2 IMPLANT
DERMABOND ADVANCED (GAUZE/BANDAGES/DRESSINGS)
DERMABOND ADVANCED .7 DNX12 (GAUZE/BANDAGES/DRESSINGS) IMPLANT
DRSG OPSITE POSTOP 4X10 (GAUZE/BANDAGES/DRESSINGS) ×2 IMPLANT
ELECT REM PT RETURN 9FT ADLT (ELECTROSURGICAL) ×2
ELECTRODE REM PT RTRN 9FT ADLT (ELECTROSURGICAL) ×1 IMPLANT
EXTRACTOR VACUUM KIWI (MISCELLANEOUS) IMPLANT
GLOVE BIO SURGEON STRL SZ 6 (GLOVE) ×2 IMPLANT
GLOVE BIOGEL PI IND STRL 6 (GLOVE) ×2 IMPLANT
GLOVE BIOGEL PI IND STRL 7.0 (GLOVE) ×1 IMPLANT
GLOVE BIOGEL PI INDICATOR 6 (GLOVE) ×2
GLOVE BIOGEL PI INDICATOR 7.0 (GLOVE) ×1
GOWN STRL REUS W/TWL LRG LVL3 (GOWN DISPOSABLE) ×4 IMPLANT
KIT ABG SYR 3ML LUER SLIP (SYRINGE) ×2 IMPLANT
NDL HYPO 25X5/8 SAFETYGLIDE (NEEDLE) ×1 IMPLANT
NEEDLE HYPO 25X5/8 SAFETYGLIDE (NEEDLE) ×2 IMPLANT
NS IRRIG 1000ML POUR BTL (IV SOLUTION) ×2 IMPLANT
PACK C SECTION WH (CUSTOM PROCEDURE TRAY) ×2 IMPLANT
PAD OB MATERNITY 4.3X12.25 (PERSONAL CARE ITEMS) ×2 IMPLANT
PENCIL SMOKE EVAC W/HOLSTER (ELECTROSURGICAL) ×2 IMPLANT
STRIP CLOSURE SKIN 1/2X4 (GAUZE/BANDAGES/DRESSINGS) ×1 IMPLANT
SUT CHROMIC 0 CTX 36 (SUTURE) ×6 IMPLANT
SUT MON AB 2-0 CT1 27 (SUTURE) ×2 IMPLANT
SUT PDS AB 0 CT1 27 (SUTURE) IMPLANT
SUT PLAIN 0 NONE (SUTURE) IMPLANT
SUT VIC AB 0 CT1 27 (SUTURE) ×2
SUT VIC AB 0 CT1 27XBRD ANTBC (SUTURE) IMPLANT
SUT VIC AB 0 CT1 36 (SUTURE) IMPLANT
SUT VIC AB 4-0 KS 27 (SUTURE) ×1 IMPLANT
TOWEL OR 17X24 6PK STRL BLUE (TOWEL DISPOSABLE) ×2 IMPLANT
TRAY FOLEY BAG SILVER LF 14FR (SET/KITS/TRAYS/PACK) IMPLANT

## 2016-10-21 NOTE — MAU Note (Signed)
PT  SAYS UC  HURT  BAD  SINCE  10PM    AND  DIARRHEA STARTED  AT 830PM.   PT   SAYS  C/S    TODAY AT 0730  FOR  BREECH    .   ATE LAST   8PM .    LAST PO 2315.     VE IN OFFICE   1  CM

## 2016-10-21 NOTE — H&P (Signed)
Kelly Navarro is a 31 y.o. female presenting for labor with CTX starting tonight around 6pm.  She has known breech presentation; s/p failed ECV.  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 MFM consult done. Labs have been normal. GBS negative.          OB History    Gravida Para Term Preterm AB Living   1             SAB TAB Ectopic Multiple Live Births                     Past Medical History:  Diagnosis Date  . Bee sting allergy   . 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 Eczema   . Gaucher disease, type I (HCC)    noted on [facility] genetic testing 05/2015  . GERD (gastroesophageal reflux disease)   . Headache(784.0)    menstrual   . IBS (irritable bowel syndrome)   . Scoliosis    very mild        Past Surgical History:  Procedure Laterality Date  . GUM SURGERY    . WISDOM TOOTH EXTRACTION     Family History: family history includes Asthma in her sister; Breast cancer in her paternal grandmother; Cancer in her paternal grandfather; Diabetes in her maternal grandfather; Eczema in her sister; Heart disease in her maternal grandfather; Hypertension in her maternal grandfather and mother. Social History:  reports that she has never smoked. She has never used smokeless tobacco. She reports that she drinks about 0.6 oz of alcohol per week . She reports that she does not use drugs.     Maternal Diabetes: No Genetic Screening: Abnormal:  Results: Other:see above Maternal Ultrasounds/Referrals: Normal Fetal Ultrasounds or other Referrals:  Referred to Materal Fetal Medicine  Maternal Substance Abuse:  No Significant Maternal Medications:  None Significant Maternal Lab Results:  None Other Comments:  see above  Review of  Systems  Eyes: Negative for blurred vision.  Gastrointestinal: Negative for abdominal pain.  Neurological: Negative for headaches.   History There were no vitals taken for this visit. Exam Physical Exam  Cardiovascular: Normal rate and regular rhythm.   Respiratory: Effort normal and breath sounds normal.  GI: Soft. There is no tenderness.  Neurological: She has normal reflexes.  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

## 2016-10-21 NOTE — Lactation Note (Signed)
This note was copied from a baby's chart. Lactation Consultation Note  Patient Name: [name] Date: 10/21/2016 Reason for consult: Follow-up assessment Baby at 16 hr of life. Lactation went in the room to do a [employer] audit and mom requested latch help. Her L nipple is a dull red with bright red hairline fissures. Given comfort gels and reviewed breast care. Baby was able to latch comfortably in cross cradle to the L breast. Discussed baby behavior, feeding frequency, voids, wt loss, breast changes, and nipple care. Demonstrated manual expression, colostrum noted bilaterally, spoon in room. Mom is aware of lactation services and support group.     Maternal Data    Feeding Feeding Type: Breast Fed Length of feed: 10 min  LATCH Score/Interventions Latch: Repeated attempts needed to sustain latch, nipple held in mouth throughout feeding, stimulation needed to elicit sucking reflex. Intervention(s): Skin to skin;Waking techniques Intervention(s): Adjust position;Assist with latch;Breast compression  Audible Swallowing: A few with stimulation Intervention(s): Hand expression;Skin to skin  Type of Nipple: Everted at rest and after stimulation  Comfort (Breast/Nipple): Filling, red/small blisters or bruises, mild/mod discomfort  Problem noted: Mild/Moderate discomfort;Cracked, bleeding, blisters, bruises Interventions  (Cracked/bleeding/bruising/blister): Expressed breast milk to nipple Interventions (Mild/moderate discomfort): Comfort gels (coconut oil)  Hold (Positioning): Assistance needed to correctly position infant at breast and maintain latch. Intervention(s): Position options;Support Pillows  LATCH Score: 6  Lactation Tools Discussed/Used     Consult Status Consult Status: Follow-up Date: 10/22/16 Follow-up type: In-patient    Rulon Eisenmenger 10/21/2016, 7:29 PM

## 2016-10-21 NOTE — Lactation Note (Signed)
This note was copied from a baby's chart. Lactation Consultation Note  Patient Name: Kelly Navarro Today's Date: 10/21/2016   Initial visit attempted around 6 hours of life. Mom reports + breast changes w/pregnancy.   Consult interrupted by RN doing fundal check, etc. & arrival of pediatrician during consult. Infant then cueing to feed. Infant put to breast, but then fell asleep. Will return later today.    Lurline Hare [facility] 10/21/2016, 9:48 AM

## 2016-10-21 NOTE — Lactation Note (Signed)
This note was copied from a baby's chart. Lactation Consultation Note  Patient Name: Kelly Navarro [id number] Date: 10/21/2016 Reason for consult: Follow-up assessment Baby at 19 hr of life. RN called for lactation to help mom latch baby. Baby was able to easily latch to the R breast in cross cradle. Reviewed breast care and manual expression spoon feeding. Parents are aware of lactation services and support group.   Maternal Data    Feeding Feeding Type: Breast Fed Length of feed: 15 min  LATCH Score/Interventions Latch: Grasps breast easily, tongue down, lips flanged, rhythmical sucking. Intervention(s): Teach feeding cues Intervention(s): Adjust position  Audible Swallowing: A few with stimulation Intervention(s): Hand expression  Type of Nipple: Everted at rest and after stimulation  Comfort (Breast/Nipple): Filling, red/small blisters or bruises, mild/mod discomfort  Problem noted: Mild/Moderate discomfort;Cracked, bleeding, blisters, bruises Interventions  (Cracked/bleeding/bruising/blister): Expressed breast milk to nipple Interventions (Mild/moderate discomfort): Comfort gels (coconut oil)  Hold (Positioning): Assistance needed to correctly position infant at breast and maintain latch. Intervention(s): Position options;Support Pillows  LATCH Score: 7  Lactation Tools Discussed/Used     Consult Status Consult Status: Follow-up Date: 10/22/16 Follow-up type: In-patient    Kelly Navarro 10/21/2016, 10:12 PM

## 2016-10-21 NOTE — Anesthesia Postprocedure Evaluation (Addendum)
Anesthesia Post Note  Patient: Kelly Navarro  Procedure(s) Performed: Procedure(s) (LRB): CESAREAN SECTION (N/A)  Patient location during evaluation: Mother Baby Anesthesia Type: Spinal Level of consciousness: oriented and awake and alert Pain management: pain level controlled Vital Signs Assessment: post-procedure vital signs reviewed and stable Respiratory status: spontaneous breathing, respiratory function stable and patient connected to nasal cannula oxygen Cardiovascular status: blood pressure returned to baseline and stable Postop Assessment: no headache, no backache and patient able to bend at knees Anesthetic complications: no        Last Vitals:  Vitals:   10/21/16 0605 10/21/16 0719  BP: (!) 115/55 114/60  Pulse: 98 94  Resp: 20 18  Temp: 36.6 C     Last Pain:  Vitals:   10/21/16 0700  TempSrc:   PainSc: 1    Pain Goal: Patients Stated Pain Goal: 0 (10/21/16 0046)               Rica Records

## 2016-10-21 NOTE — Anesthesia Procedure Notes (Signed)
Spinal  Patient location during procedure: OR Start time: 10/21/2016 2:29 AM End time: 10/21/2016 2:33 AM Staffing Anesthesiologist: Leilani Able Performed: anesthesiologist  Preanesthetic Checklist Completed: patient identified, surgical consent, pre-op evaluation, timeout performed, IV checked, risks and benefits discussed and monitors and equipment checked Spinal Block Patient position: sitting Prep: site prepped and draped and DuraPrep Patient monitoring: heart rate, cardiac monitor, continuous pulse ox and blood pressure Approach: midline Location: L3-4 Injection technique: single-shot Needle Needle type: Pencan  Needle gauge: 24 G Needle length: 9 cm Assessment Sensory level: T4

## 2016-10-21 NOTE — Op Note (Signed)
Kelly Navarro PROCEDURE DATE: 10/20/2016 - 10/21/2016  PREOPERATIVE DIAGNOSIS: Intrauterine pregnancy at  105w3d weeks gestation, breech presentation, labor  POSTOPERATIVE DIAGNOSIS: The same  PROCEDURE:  Primary Low Transverse Cesarean Section  SURGEON:  Dr. Mitchel Honour  INDICATIONS: Kelly Navarro is a 31 y.o. G1P0 at [redacted]w[redacted]d scheduled for cesarean section secondary to breech presentation with labor.  The risks of cesarean section discussed with the patient included but were not limited to: bleeding which may require transfusion or reoperation; infection which may require antibiotics; injury to bowel, bladder, ureters or other surrounding organs; injury to the fetus; need for additional procedures including hysterectomy in the event of a life-threatening hemorrhage; placental abnormalities wth subsequent pregnancies, incisional problems, thromboembolic phenomenon and other postoperative/anesthesia complications. The patient concurred with the proposed plan, giving informed written consent for the procedure.    FINDINGS:  Arcuate uterus.  Viable female infant in double footling breech presentation, APGARs 8,9: weight pending  Clear amniotic fluid.  Intact placenta, three vessel cord.  Grossly normal uterus, ovaries and fallopian tubes. .   ANESTHESIA:  Spinal ESTIMATED BLOOD LOSS: 500 ml SPECIMENS: Placenta sent to L&D COMPLICATIONS: None immediate  PROCEDURE IN DETAIL:  The patient received intravenous antibiotics and had sequential compression devices applied to her lower extremities while in the preoperative area.  She was then taken to the operating room where spinal anesthesia was administered and was found to be adequate. She was then placed in a dorsal supine position with a leftward tilt, and prepped and draped in a sterile manner.  A foley catheter was placed into her bladder and attached to constant gravity.  After an adequate timeout was performed, a Pfannenstiel skin incision was made with  scalpel and carried through to the underlying layer of fascia. The fascia was incised in the midline and this incision was extended bilaterally using the Mayo scissors. Kocher clamps were applied to the superior aspect of the fascial incision and the underlying rectus muscles were dissected off bluntly. A similar process was carried out on the inferior aspect of the facial incision. The rectus muscles were separated in the midline bluntly and the peritoneum was entered bluntly. Bladder flap was created sharply and developed bluntly. Bladder blade was placed.  A transverse hysterotomy was made with a scalpel and extended bilaterally bluntly. The bladder blade was then removed. The infant was successfully delivered using typical breech maneuvers, and cord was clamped and cut and infant was handed over to awaiting neonatology team. Uterine massage was then administered and the placenta delivered intact with three-vessel cord. The uterus was cleared of clot and debris.  The hysterotomy was closed with 0 chromic.  A second imbricating suture of 0-chromic was used to reinforce the incision and aid in hemostasis.  The peritoneum and rectus muscles were noted to be hemostatic and were reapproximated using 3-0 monocryl in a running fashion.  The fascia was closed with 0-Vicryl in a running fashion with good restoration of anatomy.  The subcutaneus tissue was copiously irrigated.  The skin was closed with 4-0 vicryl in a subcuticular fashion.  Pt tolerated the procedure will.  All counts were correct x2.  Pt went to the recovery room in stable condition.

## 2016-10-21 NOTE — Transfer of Care (Signed)
Immediate Anesthesia Transfer of Care Note  Patient: Kelly Navarro  Procedure(s) Performed: Procedure(s) with comments: CESAREAN SECTION (N/A) - primary edc 10/25/16 need RNFA  Patient Location: PACU  Anesthesia Type:Spinal  Level of Consciousness: awake, alert , oriented and patient cooperative  Airway & Oxygen Therapy: Patient Spontanous Breathing  Post-op Assessment: Report given to RN and Post -op Vital signs reviewed and stable  Post vital signs: Reviewed and stable  Last Vitals:  Vitals:   10/21/16 0114 10/21/16 0119  BP:    Pulse: 97 92  Resp:    Temp:      Last Pain:  Vitals:   10/21/16 0046  TempSrc:   PainSc: 5       Patients Stated Pain Goal: 0 (10/21/16 0046)  Complications: No apparent anesthesia complications

## 2016-10-21 NOTE — Lactation Note (Addendum)
This note was copied from a baby's chart. Lactation Consultation Note  Patient Name: Kelly Navarro [id number] Date: 10/21/2016 Reason for consult: Follow-up assessment  This LC returned to finish initial consult. "Kelly Navarro" had just finished 3rd feeding (infant is now 10 hrs old) & had been bathed. Kelly Navarro is now sleeping on Mom's chest, skin-to-skin.   Mom's L nipple has 2 tiny blood blisters and 1 additional, tiny, clear-filled blister. The R nipple shows no signs of trauma. Bilaterally & circumferentially around the base of the mom's nipples, the areola is pink  with multiple, minute bumps. Mom had not noticed them. This could be irritation from latching as Mom reports having used a more "bulls-eye" approach in latching infant. Specifics of an asymmetric latch shown & explained via [employer]. I also advised that Mom look at the nipple when infant releases latch to see its shape.  Mom made aware of O/P services, breastfeeding support groups, community resources, and our phone # for post-discharge questions.   Mom has my # if she would like me to return.   Mom is a genetic carrier for Factor XI & 21 hydroxylase. She also has Gaucher's Disease (probably Type 1, per Dr. Huel Coventry note). She is on no pertinent meds.  Kelly Navarro [facility] 10/21/2016, 12:55 PM

## 2016-10-21 NOTE — Consult Note (Signed)
Neonatology Note:   Attendance at C-section:    I was asked by Dr. Morris to attend this primary C/S at term due to breech presentation, in labor. 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 ROM at delivery, fluid clear. Infant vigorous with good spontaneous cry and tone. Needed only minimal bulb suctioning. Ap 8/9. Lungs clear to ausc in DR. To CN to care of Pediatrician.   Kelly Navarro C. Cristiana Yochim, MD 

## 2016-10-21 NOTE — Progress Notes (Signed)
Subjective: Postpartum Day 0: Cesarean Delivery Patient reports incisional pain and tolerating PO.    Objective: Vital signs in last 24 hours: Temp:  [97.8 F (36.6 C)-99.1 F (37.3 C)] 97.8 F (36.6 C) (04/24 0815) Pulse Rate:  [51-116] 86 (04/24 0815) Resp:  [12-27] 18 (04/24 0815) BP: (98-123)/(55-91) 123/68 (04/24 0815) SpO2:  [85 %-100 %] 95 % (04/24 0815) Weight:  [206 lb 4 oz (93.6 kg)] 206 lb 4 oz (93.6 kg) (04/24 0020)  Physical Exam:  General: alert and cooperative Lochia: appropriate Uterine Fundus: firm Incision: healing well DVT Evaluation: No evidence of DVT seen on physical exam. Negative Homan's sign. No cords or calf tenderness. No significant calf/ankle edema.   Recent Labs  10/20/16 1125 10/21/16 0110  HGB 13.3 13.9  HCT 38.7 40.5    Assessment/Plan: Status post Cesarean section. Doing well postoperatively.  Continue current care.  Taquanna Borras G 10/21/2016, 8:45 AM

## 2016-10-22 ENCOUNTER — Encounter (HOSPITAL_COMMUNITY): Payer: Self-pay | Admitting: Obstetrics & Gynecology

## 2016-10-22 LAB — CBC
HEMATOCRIT: 30.5 % — AB (ref 36.0–46.0)
Hemoglobin: 10.6 g/dL — ABNORMAL LOW (ref 12.0–15.0)
MCH: 32.3 pg (ref 26.0–34.0)
MCHC: 34.8 g/dL (ref 30.0–36.0)
MCV: 93 fL (ref 78.0–100.0)
Platelets: 125 10*3/uL — ABNORMAL LOW (ref 150–400)
RBC: 3.28 MIL/uL — ABNORMAL LOW (ref 3.87–5.11)
RDW: 15.2 % (ref 11.5–15.5)
WBC: 12.1 10*3/uL — AB (ref 4.0–10.5)

## 2016-10-22 NOTE — Lactation Note (Signed)
This note was copied from a baby's chart. Lactation Consultation Note  Patient Name: Girl Nanci Lakatos [id number] Date: 10/22/2016 Reason for consult: Follow-up assessment  Baby 36 hours old. Mom reports that baby has had some trouble latching, so she was using a NS. However, baby not wanting to latch with NS now. Baby latched to left breast in cross cradle position without use of NS when this LC entered the room. Demonstrated to FOB how to flange baby's lower lip--and mom reported increased comfort. When baby finished nursing, mom's nipple everted and slightly misshapen along lower ledge--but not pinched like lipstick tip. Mom concerned about possible lip tie. Enc mom to discuss with baby's pediatrician if she continues to have nipple pain and/or baby having trouble latching, especially after her milk comes to volume.   Mom able to easily express colostrum, so enc her to use on nipples for healing and comfort. Mom is also using comfort gels. Enc mom to continue nursing with cues and call for assistance as needed.   Maternal Data    Feeding Feeding Type: Breast Fed Length of feed: 20 min  LATCH Score/Interventions Latch: Grasps breast easily, tongue down, lips flanged, rhythmical sucking. Intervention(s): Skin to skin;Waking techniques Intervention(s): Breast compression  Audible Swallowing: A few with stimulation Intervention(s): Skin to skin;Hand expression  Type of Nipple: Everted at rest and after stimulation  Comfort (Breast/Nipple): Filling, red/small blisters or bruises, mild/mod discomfort  Problem noted: Mild/Moderate discomfort Interventions (Mild/moderate discomfort): Comfort gels  Hold (Positioning): Assistance needed to correctly position infant at breast and maintain latch. Intervention(s): Breastfeeding basics reviewed;Support Pillows;Position options;Skin to skin  LATCH Score: 7  Lactation Tools Discussed/Used     Consult Status Consult Status: Follow-up Date:  10/23/16 Follow-up type: In-patient    Sherlyn Hay 10/22/2016, 3:03 PM

## 2016-10-22 NOTE — Progress Notes (Signed)
Subjective: Postpartum Day 1: Cesarean Delivery Patient reports incisional pain, tolerating PO, + flatus and no problems voiding.    Objective: Vital signs in last 24 hours: Temp:  [98 F (36.7 C)-98.5 F (36.9 C)] 98.2 F (36.8 C) (04/25 0625) Pulse Rate:  [82-87] 84 (04/25 0625) Resp:  [16-18] 16 (04/25 0625) BP: (105-113)/(54-59) 105/54 (04/25 0625) SpO2:  [93 %-95 %] 95 % (04/24 2100)  Physical Exam:  General: alert and cooperative Lochia: appropriate Uterine Fundus: firm Incision: healing well DVT Evaluation: No evidence of DVT seen on physical exam. Negative Homan's sign. No cords or calf tenderness. No significant calf/ankle edema.   Recent Labs  10/21/16 0110 10/22/16 0607  HGB 13.9 10.6*  HCT 40.5 30.5*    Assessment/Plan: Status post Cesarean section. Doing well postoperatively.  Continue current care.  CURTIS,CAROL G 10/22/2016, 8:48 AM

## 2016-10-23 NOTE — Progress Notes (Signed)
Subjective: Postpartum Day 2: Cesarean Delivery Patient reports incisional pain, tolerating PO, + flatus and no problems voiding.    Objective: Vital signs in last 24 hours: Temp:  [97.7 F (36.5 C)-98.1 F (36.7 C)] 98.1 F (36.7 C) (04/26 0634) Pulse Rate:  [84-85] 84 (04/26 0634) Resp:  [18] 18 (04/26 0634) BP: (110-111)/(65-66) 110/66 (04/26 1610)  Physical Exam:  General: alert and cooperative Lochia: appropriate Uterine Fundus: firm Incision: healing well DVT Evaluation: No evidence of DVT seen on physical exam. Negative Homan's sign. No cords or calf tenderness. No significant calf/ankle edema.   Recent Labs  10/21/16 0110 10/22/16 0607  HGB 13.9 10.6*  HCT 40.5 30.5*    Assessment/Plan: Status post Cesarean section. Doing well postoperatively.  Continue current care.  Tryphena Perkovich G 10/23/2016, 8:43 AM

## 2016-10-23 NOTE — Lactation Note (Signed)
This note was copied from a baby's chart. Lactation Consultation Note  Baby 40 hours old and mother states latch is still uncomfortable. She is using coconut oil and alternating w/ comfort gels.  Provided shells to wear with coconut oil. Had mother express drops before latching. Noted baby has some mid posterior lingual frenulum tightness.  Family is doing lots of tummy time when baby is awake. L nipple has healing blister.  Mother latched baby in cross cradle on R side. Recommend changing positions with each feeding. Demonstrated how to compress/sandwich breast to achieve a deeper latch.  Mother states latch is more comfortable. Mother concerned about lipstick shaped nipple when unlatched but compress of nipple is minimal. She is currently due to soreness only breastfeeding on one breast due to soreness. Gave her hand pump to stimulate other breast.    Patient Name: Kelly Navarro [id number] Date: 10/23/2016 Reason for consult: Follow-up assessment   Maternal Data    Feeding Feeding Type: Breast Fed Length of feed: 10 min  LATCH Score/Interventions Latch: Grasps breast easily, tongue down, lips flanged, rhythmical sucking.  Audible Swallowing: A few with stimulation  Type of Nipple: Everted at rest and after stimulation Intervention(s): Shells;Hand pump  Comfort (Breast/Nipple): Filling, red/small blisters or bruises, mild/mod discomfort  Problem noted: Mild/Moderate discomfort Interventions  (Cracked/bleeding/bruising/blister): Expressed breast milk to nipple;Hand pump (coconut oil) Interventions (Mild/moderate discomfort): Comfort gels  Hold (Positioning): No assistance needed to correctly position infant at breast.  LATCH Score: 8  Lactation Tools Discussed/Used     Consult Status Consult Status: Follow-up Date: 10/24/16 Follow-up type: In-patient    Dahlia Byes [facility] 10/23/2016, 2:50 PM

## 2016-10-24 MED ORDER — IBUPROFEN 600 MG PO TABS: 600.0000 mg | ORAL_TABLET | Freq: Four times a day (QID) | ORAL | 1 refills | Status: DC

## 2016-10-24 MED ORDER — OXYCODONE-ACETAMINOPHEN 5-325 MG PO TABS: 1.0000 | ORAL_TABLET | ORAL | 0 refills | Status: DC | PRN

## 2016-10-24 NOTE — Discharge Summary (Signed)
Obstetric Discharge Summary Reason for Admission: onset of labor Prenatal Procedures: ultrasound Intrapartum Procedures: cesarean: low cervical, transverse Postpartum Procedures: none Complications-Operative and Postpartum: none Hemoglobin  Date Value Ref Range Status  10/22/2016 10.6 (L) 12.0 - 15.0 g/dL Final    Comment:    REPEATED TO VERIFY DELTA CHECK NOTED    HCT  Date Value Ref Range Status  10/22/2016 30.5 (L) 36.0 - 46.0 % Final    Physical Exam:  General: alert and cooperative Lochia: appropriate Uterine Fundus: firm Incision: healing well DVT Evaluation: No evidence of DVT seen on physical exam. Negative Homan's sign. No cords or calf tenderness. Calf/Ankle edema is present.  Discharge Diagnoses: Term Pregnancy-delivered  Discharge Information: Date: 10/24/2016 Activity: pelvic rest Diet: routine Medications: PNV, Ibuprofen and Percocet Condition: stable Instructions: refer to practice specific booklet Discharge to: home   Newborn Data: Live born female  Birth Weight: 7 lb 4.1 oz (3290 g) APGAR: 8, 9  Home with mother.  Kelly Navarro G 10/24/2016, 8:38 AM

## 2016-10-24 NOTE — Lactation Note (Signed)
This note was copied from a baby's chart. Lactation Consultation Note  Patient Name: Kelly Navarro [id number] Date: 10/24/2016 Reason for consult: Follow-up assessment  Baby 80 hours old. Mom reports breasts are filling and she is having some nipple tenderness just as the baby latched onto breast. Mom given additional comfort gels because she requested replacement for one that was lost. Assisted mom with latching baby in football position to left breast. Baby able to latch onto full breast and maintain a deep latch with lips flanged and intermittent swallows noted. Discussed engorgement prevention/treatment, and referred mom to "Mother and Baby Care" booklet for EBM storage guidelines and number of diapers to expect by day of life. Enc softening breasts before latching baby and parents know how to hand express and spoon feed if needed.  Mom aware of OP/BFSG and LC phone line assistance after D/C.   Maternal Data    Feeding Feeding Type: Breast Fed Length of feed:  (LC assessed first 10 minutes of BF. )  LATCH Score/Interventions Latch: Grasps breast easily, tongue down, lips flanged, rhythmical sucking. Intervention(s): Skin to skin;Waking techniques Intervention(s): Adjust position;Assist with latch;Breast compression  Audible Swallowing: Spontaneous and intermittent Intervention(s): Skin to skin;Hand expression  Type of Nipple: Everted at rest and after stimulation  Comfort (Breast/Nipple): Soft / non-tender  Problem noted: Mild/Moderate discomfort Interventions (Filling): Massage;Hand pump Interventions  (Cracked/bleeding/bruising/blister): Expressed breast milk to nipple;Hand pump Interventions (Mild/moderate discomfort): Comfort gels  Hold (Positioning): Assistance needed to correctly position infant at breast and maintain latch. Intervention(s): Breastfeeding basics reviewed;Support Pillows;Position options;Skin to skin  LATCH Score: 9  Lactation Tools  Discussed/Used Tools: Shells;Comfort gels   Consult Status Consult Status: Complete    Sherlyn Hay 10/24/2016, 11:07 AM

## 2016-10-27 ENCOUNTER — Telehealth (HOSPITAL_COMMUNITY): Payer: Self-pay | Admitting: Lactation Services

## 2016-10-27 NOTE — Telephone Encounter (Signed)
Return LC telephone call:  This patient called requesting LC O/P appt.  She takes her baby to [facility] is aware  That office has a Advertising copywriter , and still  Requested LC appt. At [facility] due to painful latches and sore Nipples. LC checked baby's chart [facility] Outpatient referral present From Dr. Gaylan Gerold.  LC O/P appt. Made for tomorrow afternoon at 3:30 pm.  Mom aware of location.

## 2016-10-28 ENCOUNTER — Ambulatory Visit: Payer: Self-pay

## 2016-10-28 NOTE — Lactation Note (Signed)
This note was copied from a baby's chart. Lactation Consult  Mother's reason for visit:  Painful latch  Visit Type:  Outpatient Appointment Notes:  Mom here for feeding assessment. She reports some pain with initial latch that usually resolves with baby nursing. Reports getting better. She is using EBM/coconut oil prn.  Mom reports baby spitty after some feedings and she thinks baby might have some reflux.  Consult:  Initial Lactation Consultant:  Alfred Levins  ________________________________________________________________________  Kelly Navarro Name:  Kelly Navarro Date of Birth:  10/21/2016 Pediatrician:  Dr. Vonna Kotyk - [facility] Gender:  female Gestational Age: [redacted]w[redacted]d (At Birth) Birth Weight:  7 lb 4.1 oz (3290 g) Weight at Discharge:       3185 g                   Date of Discharge:  10/23/16 There were no vitals filed for this visit. Last weight taken from location outside of [facility]:  10/26/16  7 lb. 8.0 oz    Location:Pediatrician's office Weight today:  7 lb. 10.6 oz/3476 gm with clean diaper on. Baby 26 days old.   ________________________________________________________________________  Mother's Name: Kelly Navarro Type of delivery:  C-Section, Low Transverse Breastfeeding Experience:  P1 Maternal Medical Conditions:  Gaucher Disease Type 1, scoliosis,  Maternal Medications:  PNV, Motrin prn  ________________________________________________________________________  Breastfeeding History (Post Discharge)  Frequency of breastfeeding:  Every 3 hours during the day, 4 hours at night Duration of feeding:  15-20 minutes. Mom reports she usually will nurse 15 minutes 1st breast then change to 2nd breast.   Mom is not supplementing. She pumped when milk came in and she was engorged but not since. Mom has DEBP at home.   Infant Intake and Output Assessment  Voids:  5-7 in 24 hrs.  Color:  Clear yellow Stools:  4-5 in 24 hrs.  Color:   Yellow/seedy  ________________________________________________________________________  Maternal Breast Assessment  Breast:  Full Nipple:  Erect Pain level:  5 with initial latch on right breast today, resolved after few seconds.  Pain interventions:  Expressed breast milk/coconut oil prn  _______________________________________________________________________ Feeding Assessment/Evaluation  Initial feeding assessment:  Infant's oral assessment:  Variance. LC notes short labial frenulum along with short posterior lingual frenulum. Baby does appear to have good tongue mobility and extends tongue past lower lip. Has difficulty keeping lips un-tucked at breast and with nursing on left breast, intermittent snap back audible.   Positioning:  Cross cradle Right breast  LATCH documentation:  Latch:  2 = Grasps breast easily, tongue down, lips flanged, rhythmical sucking.  Audible swallowing:  2 = Spontaneous and intermittent  Type of nipple:  2 = Everted at rest and after stimulation  Comfort (Breast/Nipple):  1 = Filling, red/small blisters or bruises, mild/mod discomfort  Hold (Positioning):  1 = Assistance needed to correctly position infant at breast and maintain latch  LATCH score:  8  Pre-feed weight:  3476 g  (7 lb. 10.6 oz.) Post-feed weight:  3552 g (7 lb. 13.3 oz.) Amount transferred:  76 ml with nursing for 20 minutes right breast. Assisted Mom to support breast, use breast massage to help baby empty breast. Demonstrated to Mom how to un-tuck the upper/lower lip for good depth. Slight crease across nipple when baby came off the breast. PS=5 with initial latch but this resolved within few seconds of baby latching.  Baby spit up small amount at end of feeding.  After keeping baby upright for few  minutes, LC assisted Mom with positioning to latch using football hold on left breast. Demonstrated how to help baby obtain good depth with initial latch. Mom denied discomfort. LC did hear  intermittent snap back with nursing on right breast which is sometimes result of short frenulum. Assisted Mom to keep lips un-tucked.  Pre-feed weight: 3552 g (7 lb. 13.3 oz) Post-feed weight:  3570 g ( 7 lb. 13.9 oz) Amount transferred:  18 ml over 5 minutes.   Total amount transferred:  94 ml  Mom was pleased to know how well baby transferring milk at breast. Advised Mom to keep baby nursing on 1st breast to empty well. Baby may or may not take 2nd breast since she is making good supply of milk. If baby only takes 1 breast per feeding or BF on 2nd breast but still uncomfortable when baby comes off the breast. Just pump to release small amount of milk to comfort and start next feeding on that side. Try to limit pumping to prevent over production of milk. This may help with baby spitting up along with keeping baby upright for 15-20 minutes after feedings. If baby starts to have difficulty managing flow of milk, hand express or pre-pump thru 1st milk ejection prior to latching baby.  Baby should be at breast 8-12 times in 24 hours. For sore nipples, continue to apply EBM/coconut oil, comfort gels given with instruction to alternate with coconut oil. Alternate positions with BF. Would like nipple to be round when baby comes off the breast.  If nipple pain does not continue to resolve, call for OP Lactation f/u and may try nipple shield.  Encouraged to come to support group.

## 2016-12-01 DIAGNOSIS — Z1389 Encounter for screening for other disorder: Secondary | ICD-10-CM | POA: Diagnosis not present

## 2016-12-05 NOTE — Addendum Note (Signed)
Addendum  created 12/05/16 1201 by Yousof Alderman, MD   Sign clinical note    

## 2017-01-21 ENCOUNTER — Encounter (HOSPITAL_COMMUNITY): Payer: Self-pay

## 2017-02-27 ENCOUNTER — Encounter: Payer: Self-pay | Admitting: Family Medicine

## 2017-02-27 ENCOUNTER — Ambulatory Visit (INDEPENDENT_AMBULATORY_CARE_PROVIDER_SITE_OTHER): Payer: BC Managed Care – PPO | Admitting: Family Medicine

## 2017-02-27 VITALS — BP 120/80 | HR 71 | Temp 98.2°F | Resp 16 | Wt 180.6 lb

## 2017-02-27 DIAGNOSIS — J069 Acute upper respiratory infection, unspecified: Secondary | ICD-10-CM | POA: Diagnosis not present

## 2017-02-27 MED ORDER — AZITHROMYCIN 250 MG PO TABS: ORAL_TABLET | ORAL | 0 refills | Status: DC

## 2017-02-27 NOTE — Patient Instructions (Signed)
I suspect your symptoms are related to a viral illness and recommend treating your symptoms at this point.   drink extra water, use salt water gargles for throat irritation and Tylenol or Ibuprofen for aches and pains.  If you are not improving by days 7-10 of your illness or if you develop fever, wheezing or worsening symptoms then you can start the antibiotic.

## 2017-02-27 NOTE — Progress Notes (Signed)
Subjective:  Kelly Navarro is a 31 y.o. female who presents for a 5 day history of sore throat, post nasal drainage, left ear pain, and cough that is mostly dry.  States her symptoms are gradually improving. No longer having sore throat. Feels like she has a cold but wants to make sure since this weekend is a long holiday one. She has her 884 month old with her today and she is breastfeeding.   Denies fever, chills, headache, sinus pain, chest pain, shortness of breath, wheezing, abdominal pain, N/V/D.  Denies smoking history or history of pneumonia, asthma or bronchitis.   Treatment to date: neti pot.  Denies sick contacts.  No other aggravating or relieving factors.  No other c/o.  ROS as in subjective.   Objective: Vitals:   02/27/17 1150  BP: 120/80  Pulse: 71  Resp: 16  Temp: 98.2 F (36.8 C)  SpO2: 99%    General appearance: Alert, WD/WN, no distress, well- appearing                             Skin: warm, no rash                           Head: no sinus tenderness                            Eyes: conjunctiva normal, corneas clear, PERRLA                            Ears: pearly TMs, external ear canals normal                          Nose: septum midline, turbinates swollen R>L, with erythema and clear discharge             Mouth/throat: MMM, tongue normal, mild pharyngeal erythema without edema or exudate                           Neck: supple, no adenopathy, no thyromegaly, nontender                          Heart: RRR, normal S1, S2, no murmurs                         Lungs: CTA bilaterally, no wheezes, rales, or rhonchi      Assessment: Acute URI  Plan: Discussed diagnosis and treatment of URI.  Suggested symptomatic OTC remedies that are safe for lactation. Z-pack sent to pharmacy and advised to hold off on taking this unless she gets worse over the weekend. Discussed that her symptoms appear to be viral in nature.  Nasal saline spray for congestion.  Tylenol or  Ibuprofen OTC for fever and malaise.  Call/return in 2-3 days if symptoms aren't resolving.

## 2017-05-14 ENCOUNTER — Encounter: Payer: Self-pay | Admitting: *Deleted

## 2017-08-18 ENCOUNTER — Ambulatory Visit: Payer: BC Managed Care – PPO | Admitting: Family Medicine

## 2017-08-27 DIAGNOSIS — L2089 Other atopic dermatitis: Secondary | ICD-10-CM | POA: Diagnosis not present

## 2017-08-27 DIAGNOSIS — D1801 Hemangioma of skin and subcutaneous tissue: Secondary | ICD-10-CM | POA: Diagnosis not present

## 2017-08-27 DIAGNOSIS — D225 Melanocytic nevi of trunk: Secondary | ICD-10-CM | POA: Diagnosis not present

## 2017-08-27 DIAGNOSIS — L814 Other melanin hyperpigmentation: Secondary | ICD-10-CM | POA: Diagnosis not present

## 2017-12-10 DIAGNOSIS — N760 Acute vaginitis: Secondary | ICD-10-CM | POA: Diagnosis not present

## 2017-12-10 DIAGNOSIS — Z01419 Encounter for gynecological examination (general) (routine) without abnormal findings: Secondary | ICD-10-CM | POA: Diagnosis not present

## 2017-12-10 DIAGNOSIS — Z6825 Body mass index (BMI) 25.0-25.9, adult: Secondary | ICD-10-CM | POA: Diagnosis not present

## 2017-12-11 LAB — HM PAP SMEAR: HPV, high-risk: NEGATIVE

## 2018-01-13 DIAGNOSIS — N760 Acute vaginitis: Secondary | ICD-10-CM | POA: Diagnosis not present

## 2018-01-13 DIAGNOSIS — Z3200 Encounter for pregnancy test, result unknown: Secondary | ICD-10-CM | POA: Diagnosis not present

## 2018-03-24 ENCOUNTER — Ambulatory Visit: Payer: BLUE CROSS/BLUE SHIELD | Admitting: Family Medicine

## 2018-03-24 ENCOUNTER — Encounter: Payer: Self-pay | Admitting: Family Medicine

## 2018-03-24 VITALS — BP 108/70 | HR 68 | Temp 97.8°F | Ht 68.0 in | Wt 171.8 lb

## 2018-03-24 DIAGNOSIS — J069 Acute upper respiratory infection, unspecified: Secondary | ICD-10-CM | POA: Diagnosis not present

## 2018-03-24 DIAGNOSIS — Z23 Encounter for immunization: Secondary | ICD-10-CM

## 2018-03-24 NOTE — Progress Notes (Signed)
Chief Complaint  Patient presents with  . Sinus Problem    FACIAL PAIN,CONGESTION,COUGH X1-2 WEEKS   9/15 she started with tickle in her throat, the next day had a cold. She had stuffy nose, congestion. She thought she was getting better over the past weekend, but 3 days ago the improvement stopped, and today she is having facial pain.  She isn't blowing out much, but has pain in cheeks and the sides of her nose.  Noted teeth pain with walking today, and headache. Very little nasal discharge, clear in color. Occasional tickle in her throat, but hasn't moved into chest, no coughing.  +sick contacts--her own daughter, as well as at work (students, and a Radio broadcast assistant).  Used some Afrin last night. Used some phenylephrine last week for 3 days. Used Xicam nasal swabs. Hasn't used anything this week.   PMH, PSH, SH reviewed  Outpatient Encounter Medications as of 03/24/2018  Medication Sig  . [DISCONTINUED] acetaminophen (TYLENOL) 500 MG tablet Take 1,000 mg by mouth every 6 (six) hours as needed for mild pain or headache.   . [DISCONTINUED] azithromycin (ZITHROMAX Z-PAK) 250 MG tablet Take 2 tablets on day 1, then 1 tablet on days 2-5.  . [DISCONTINUED] Prenatal Vit-Fe Fumarate-FA (PRENATAL VITAMIN PO) Take 1 tablet by mouth daily.    No facility-administered encounter medications on file as of 03/24/2018.    Allergies  Allergen Reactions  . Amoxicillin Hives    Has patient had a PCN reaction causing immediate rash, facial/tongue/throat swelling, SOB or lightheadedness with hypotension: Yes Has patient had a PCN reaction causing severe rash involving mucus membranes or skin necrosis: No Has patient had a PCN reaction that required hospitalization No Has patient had a PCN reaction occurring within the last 10 years: No If all of the above answers are "NO", then may proceed with Cephalosporin use.  . Bee Venom Other (See Comments)    Tongue swelling  . Macrodantin [Nitrofurantoin  Macrocrystal] Nausea And Vomiting  . Penicillins Hives    Has patient had a PCN reaction causing immediate rash, facial/tongue/throat swelling, SOB or lightheadedness with hypotension: Yes Has patient had a PCN reaction causing severe rash involving mucus membranes or skin necrosis: No Has patient had a PCN reaction that required hospitalization No Has patient had a PCN reaction occurring within the last 10 years: No If all of the above answers are "NO", then may proceed with Cephalosporin use.  Marland Kitchen Pineapple     Itchy throat  . Sulfa Antibiotics Hives   ROS: No fever, chills.  No nausea, vomiting, diarrhea. No fever, chills. +sinus headaches, no other headache. No ear pain, dizziness, chest pain, shortness of breath, cough, bleeding, bruising, rash or other complaints  PHYSICAL EXAM:  BP 108/70   Pulse 68   Temp 97.8 F (36.6 C) (Oral)   Ht 5\' 8"  (1.727 m)   Wt 171 lb 12.8 oz (77.9 kg)   SpO2 99%   BMI 26.12 kg/m   Well-appearing, pleasant female, in good spirits, in no distress HEENT: conjunctiva and sclera are clear, EOMI. TM's and EAC's normal. Nasal mucosa is mildly edematous, no erythema or purulence. Sinuses are tender x 4, mild OP is clear, no erythema or lesions Neck: no lymphadenopathy, thyromegaly or mass Heart: regular rate and rhythm, no murmur Lungs: clear bilaterally Skin: normal turgor, no rash Neuro: alert and oriented, cranial nerves intact, normal gait Psych: normal mood, affect, hygiene and grooming  ASSESSMENT/PLAN:   Drink plenty of water. Restart taking decongestants (phenylephrine  or pseudoephedrine). Start sinus rinses once or twice daily. mucinex (guaifenesin) can help loosen the mucus so it is easier to drain from the sinuses.  You can use the plain (not D or DM) since you aren't coughing and will be taking a separate decongestant. Contact us if you develop fever, discolored mucus, worsening symptoms or pain. We would want to see if you if you  develop high fever, shortness of breath, pain with breathing.

## 2018-03-24 NOTE — Patient Instructions (Signed)
  Drink plenty of water. Restart taking decongestants (phenylephrine or pseudoephedrine). Start sinus rinses once or twice daily. mucinex (guaifenesin) can help loosen the mucus so it is easier to drain from the sinuses.  You can use the plain (not D or DM) since you aren't coughing and will be taking a separate decongestant. Contact us if you develop fever, discolored mucus, worsening symptoms or pain. We would want to see if you if you develop high fever, shortness of breath, pain with breathing.

## 2018-03-28 ENCOUNTER — Encounter: Payer: Self-pay | Admitting: Family Medicine

## 2018-04-19 ENCOUNTER — Encounter: Payer: Self-pay | Admitting: Family Medicine

## 2018-04-19 ENCOUNTER — Ambulatory Visit: Payer: BLUE CROSS/BLUE SHIELD | Admitting: Family Medicine

## 2018-04-19 VITALS — BP 110/74 | HR 80 | Temp 99.0°F | Ht 68.0 in | Wt 173.0 lb

## 2018-04-19 DIAGNOSIS — R509 Fever, unspecified: Secondary | ICD-10-CM

## 2018-04-19 DIAGNOSIS — J029 Acute pharyngitis, unspecified: Secondary | ICD-10-CM

## 2018-04-19 DIAGNOSIS — B2790 Infectious mononucleosis, unspecified without complication: Secondary | ICD-10-CM

## 2018-04-19 LAB — POCT RAPID STREP A (OFFICE): Rapid Strep A Screen: NEGATIVE

## 2018-04-19 LAB — POCT MONO (EPSTEIN BARR VIRUS): Mono, POC: POSITIVE — AB

## 2018-04-19 NOTE — Patient Instructions (Addendum)
Drink plenty of water. Use claritin and/or sudafed to help with congestion and drainage. Continue tylenol as needed for fever and pain. You may also use ibuprofen or aleve along with the tylenol.   Infectious Mononucleosis Infectious mononucleosis is a viral infection. It is often referred to as "mono." It causes symptoms that affect various areas of the body, including the throat, upper air passages, and lymph glands. The liver or spleen may also be affected. The virus spreads from person to person (is contagious) through close contact. The illness is usually not serious, and it typically goes away in 2-4 weeks without treatment. In rare cases, symptoms can be more severe and last longer, sometimes up to several months. What are the causes? This condition is commonly caused by the Epstein-Barr virus. This virus spreads through:  Contact with an infected person's saliva or other bodily fluids, often through: ? Kissing. ? Sexual contact. ? Coughing. ? Sneezing.  Sharing utensils or drinking glasses that were recently used by an infected person.  Blood transfusions.  Organ transplantation.  What increases the risk? You are more likely to develop this condition if:  You are 32-32 years old.  What are the signs or symptoms? Symptoms of this condition usually appear 4-6 weeks after infection. Symptoms may develop slowly and occur at different times. Common symptoms include:  Sore throat.  Headache.  Extreme fatigue.  Muscle aches.  Swollen glands.  Fever.  Poor appetite.  Rash.  Other symptoms include:  Enlarged liver or spleen.  Nausea.  Abdominal pain.  How is this diagnosed? This condition may be diagnosed based on:  Your medical history.  Your symptoms.  A physical exam.  Blood tests to confirm the diagnosis.  How is this treated? There is no cure for this condition. Infectious mononucleosis usually goes away on its own with time. Treatment can help  relieve symptoms and may include:  Taking medicines to relieve pain and fever.  Drinking plenty of fluids.  Getting a lot of rest.  Medicine (corticosteroids)to reduce swelling. This may be used if swelling in the throat causes breathing or swallowing problems.  In some severe cases, treatment has to be given in a hospital. Follow these instructions at home: Medicines  Take over-the-counter and prescription medicines only as told by your health care provider.  Do not take ampicillin or amoxicillin. This may cause a rash.  If you are under 18, do not take aspirin because of the association with Reye syndrome. Activity  Rest as needed.  Do not participate in any of the following activities until your health care provider approves: ? Contact sports. You may need to wait at least a month before participating in sports. ? Exercise that requires a lot of energy. ? Heavy lifting.  Gradually resume your normal activities after your fever is gone, or when your health care provider tells you that you can. Be sure to rest when you get tired. General instructions  Avoid kissing or sharing utensils or drinking glasses until your health care provider tells you that you are no longer contagious.  Drink enough fluid to keep your urine clear or pale yellow.  Do not drink alcohol.  If you have a sore throat: ? Gargle with a salt-water mixture 3-4 times a day or as needed. To make a salt-water mixture, completely dissolve -1 tsp of salt in 1 cup of warm water. ? Eat soft foods. Cold foods such as ice cream or frozen ice pops can soothe a sore throat. ?  Try sucking on hard candy.  Wash your hands often with soap and water to avoid spreading the infection. If soap and water are not available, use hand sanitizer. How is this prevented?  Avoid contact with people who are infected with mononucleosis. An infected person may not always appear ill, but he or she can still spread the  virus.  Avoid sharing utensils, drinking glasses, or toothbrushes.  Wash your hands frequently with soap and water. If soap and water are not available, use hand sanitizer.  Use the inside of your elbow to cover your mouth when coughing or sneezing. Contact a health care provider if:  Your fever is not gone after 10 days.  You have swollen lymph nodes that are not back to normal after 4 weeks.  Your activity level is not back to normal after 2 months.  Your skin or the white parts of your eyes turn yellow (jaundice).  You have constipation. This may mean that you have: ? Fewer bowel movements in a week than normal. ? Difficulty having a bowel movement. ? Stools that are dry, hard, or larger than normal. Get help right away if:  You have severe pain in your abdomen or shoulder.  You are drooling.  You have trouble swallowing.  You have trouble breathing.  You develop a stiff neck.  You develop a severe headache.  You cannot stop vomiting.  You have jerky movements that you cannot control (seizures).  You are confused.  You have trouble with balance.  Your nose or gums begin to bleed.  You have signs of dehydration. These may include: ? Weakness. ? Sunken eyes. ? Pale skin. ? Dry mouth. ? Rapid breathing or pulse. Summary  Infectious mononucleosis, or "mono," is an infection caused by the Epstein-Barr virus.  The virus that causes this condition is spread through bodily fluids. The virus is most commonly spread by kissing or sharing drinks or utensils with an infected person.  You are more likely to develop this infection if you are 61-72 years old.  Symptoms of this condition can include sore throat, headache, fever, swollen glands, muscle aches, extreme fatigue, and swollen liver or spleen.  There is no cure for this condition. The goal of treatment is to help relieve symptoms. Treatment may include drinking plenty of water, getting a lot of rest, and  taking pain relievers. This information is not intended to replace advice given to you by your health care provider. Make sure you discuss any questions you have with your health care provider. Document Released: 06/13/2000 Document Revised: 03/04/2016 Document Reviewed: 03/04/2016 Elsevier Interactive Patient Education  2017 [employer].

## 2018-04-19 NOTE — Progress Notes (Signed)
Chief Complaint  Patient presents with  . Sore Throat    and fever that started Friday. Spiked Sat night. Yesterday fever was low grade off and on. Sore throat worsened yesterday, feels clammy and achy- thinks she can see some white spots in her throat.     Friday started feeling tired, the next day she started with chills, and sore throat started later that night. She is complaining of sore throat, L>R currently, radiates up to the ears.  Has had low grade fever x 2 days.  She feels achey, clammy. She has been taking tylenol every 6 hours to keep fever down.  Tmax 101 yesterday afternoon.  Slight sniffling. Some congestion over the last week, she thought was fall allergies. Denies sinus pain, discolored mucus, cough.  Daughter is sick with "snotty nose" +sick contacts through work (sick kids).  Last seen for URI symptoms 9/25.   She eventually got better, didn't need antibiotics.  PMH, PSH, SH reviewed  Outpatient Encounter Medications as of 04/19/2018  Medication Sig Note  . acetaminophen (TYLENOL) 500 MG tablet Take 1,000 mg by mouth every 6 (six) hours as needed. 04/19/2018: Last dose 7:30am   No facility-administered encounter medications on file as of 04/19/2018.    Allergies  Allergen Reactions  . Amoxicillin Hives    Has patient had a PCN reaction causing immediate rash, facial/tongue/throat swelling, SOB or lightheadedness with hypotension: Yes Has patient had a PCN reaction causing severe rash involving mucus membranes or skin necrosis: No Has patient had a PCN reaction that required hospitalization No Has patient had a PCN reaction occurring within the last 10 years: No If all of the above answers are "NO", then may proceed with Cephalosporin use.  . Bee Venom Other (See Comments)    Tongue swelling  . Macrodantin [Nitrofurantoin Macrocrystal] Nausea And Vomiting  . Penicillins Hives    Has patient had a PCN reaction causing immediate rash, facial/tongue/throat  swelling, SOB or lightheadedness with hypotension: Yes Has patient had a PCN reaction causing severe rash involving mucus membranes or skin necrosis: No Has patient had a PCN reaction that required hospitalization No Has patient had a PCN reaction occurring within the last 10 years: No If all of the above answers are "NO", then may proceed with Cephalosporin use.  Marland Kitchen Pineapple     Itchy throat  . Sulfa Antibiotics Hives   ROS: see HPI re: fever, chills, URI symptoms. No chest pain, cough, shortness of breath, nausea, vomiting, diarrhea, bleeding, bruising, rash, urinary complaints or other concerns, just as noted in HPI.    PHYSICAL EXAM:  BP 110/74   Pulse 80   Temp 99 F (37.2 C) (Tympanic)   Ht 5\' 8"  (1.727 m)   Wt 173 lb (78.5 kg)   LMP 03/27/2018 (Approximate)   Breastfeeding? No   BMI 26.30 kg/m   Well-appearing, pleasant female, in no distress HEENT: conjunctiva and sclera are clear, EOMI,  Tonsils are mildly erythematous, with exudate noted bilaterally, L>R.  There is erythema posteriorly with cobblestoning. Nasal mucosa is mild-mod edematous, clear-white mucus noted R>L.  Sinuses are nontender. TM's and EAc's normal bilaterally Neck: No lymphadenopathy or mass Heart: regular rate and rhythm, no murmur Lungs: clear bilaterally Abdomen: soft, nontender, no hepatosplenomegaly Extremities: no edema Skin: normal turgor, no rash   Negative rapid strep Mono spot +   ASSESSMENT/PLAN:  Infectious mononucleosis without complication, infectious mononucleosis due to unspecified organism  Sore throat - Plan: Rapid Strep A, Mono (Epstein Barr Virus)  Fever, unspecified fever cause - Plan: Rapid Strep A, Mono (Epstein Barr Virus)  Supportive measures reviewed, as well as s/sx of complication. F/u prn

## 2018-04-26 ENCOUNTER — Encounter: Payer: Self-pay | Admitting: Family Medicine

## 2018-05-17 ENCOUNTER — Encounter: Payer: Self-pay | Admitting: Family Medicine

## 2018-06-30 HISTORY — PX: OTHER SURGICAL HISTORY: SHX169

## 2018-07-28 DIAGNOSIS — N911 Secondary amenorrhea: Secondary | ICD-10-CM | POA: Diagnosis not present

## 2018-07-28 DIAGNOSIS — O200 Threatened abortion: Secondary | ICD-10-CM | POA: Diagnosis not present

## 2018-07-30 DIAGNOSIS — O200 Threatened abortion: Secondary | ICD-10-CM | POA: Diagnosis not present

## 2018-08-06 DIAGNOSIS — O039 Complete or unspecified spontaneous abortion without complication: Secondary | ICD-10-CM | POA: Diagnosis not present

## 2018-08-06 DIAGNOSIS — O021 Missed abortion: Secondary | ICD-10-CM | POA: Diagnosis not present

## 2018-08-12 DIAGNOSIS — O039 Complete or unspecified spontaneous abortion without complication: Secondary | ICD-10-CM | POA: Diagnosis not present

## 2019-01-11 DIAGNOSIS — N760 Acute vaginitis: Secondary | ICD-10-CM | POA: Diagnosis not present

## 2019-01-11 DIAGNOSIS — N911 Secondary amenorrhea: Secondary | ICD-10-CM | POA: Diagnosis not present

## 2019-01-17 DIAGNOSIS — Z3685 Encounter for antenatal screening for Streptococcus B: Secondary | ICD-10-CM | POA: Diagnosis not present

## 2019-01-17 DIAGNOSIS — Z3481 Encounter for supervision of other normal pregnancy, first trimester: Secondary | ICD-10-CM | POA: Diagnosis not present

## 2019-01-29 DIAGNOSIS — Z5189 Encounter for other specified aftercare: Secondary | ICD-10-CM

## 2019-01-29 HISTORY — DX: Encounter for other specified aftercare: Z51.89

## 2019-02-03 DIAGNOSIS — Z3400 Encounter for supervision of normal first pregnancy, unspecified trimester: Secondary | ICD-10-CM | POA: Diagnosis not present

## 2019-02-03 DIAGNOSIS — Z113 Encounter for screening for infections with a predominantly sexual mode of transmission: Secondary | ICD-10-CM | POA: Diagnosis not present

## 2019-02-16 ENCOUNTER — Other Ambulatory Visit: Payer: Self-pay

## 2019-02-16 ENCOUNTER — Inpatient Hospital Stay (HOSPITAL_COMMUNITY)
Admission: AD | Admit: 2019-02-16 | Discharge: 2019-02-16 | Disposition: A | Discharge status: Home / Self Care | Payer: BC Managed Care – PPO | Attending: Obstetrics and Gynecology | Admitting: Obstetrics and Gynecology

## 2019-02-16 ENCOUNTER — Encounter (HOSPITAL_COMMUNITY): Payer: Self-pay | Admitting: *Deleted

## 2019-02-16 DIAGNOSIS — Z91030 Bee allergy status: Secondary | ICD-10-CM | POA: Insufficient documentation

## 2019-02-16 DIAGNOSIS — O9989 Other specified diseases and conditions complicating pregnancy, childbirth and the puerperium: Secondary | ICD-10-CM | POA: Diagnosis not present

## 2019-02-16 DIAGNOSIS — Z882 Allergy status to sulfonamides status: Secondary | ICD-10-CM | POA: Insufficient documentation

## 2019-02-16 DIAGNOSIS — Z807 Family history of other malignant neoplasms of lymphoid, hematopoietic and related tissues: Secondary | ICD-10-CM | POA: Diagnosis not present

## 2019-02-16 DIAGNOSIS — Z833 Family history of diabetes mellitus: Secondary | ICD-10-CM | POA: Diagnosis not present

## 2019-02-16 DIAGNOSIS — M419 Scoliosis, unspecified: Secondary | ICD-10-CM | POA: Diagnosis not present

## 2019-02-16 DIAGNOSIS — Z880 Allergy status to penicillin: Secondary | ICD-10-CM | POA: Diagnosis not present

## 2019-02-16 DIAGNOSIS — O209 Hemorrhage in early pregnancy, unspecified: Secondary | ICD-10-CM

## 2019-02-16 DIAGNOSIS — Z803 Family history of malignant neoplasm of breast: Secondary | ICD-10-CM | POA: Diagnosis not present

## 2019-02-16 DIAGNOSIS — Z881 Allergy status to other antibiotic agents status: Secondary | ICD-10-CM | POA: Diagnosis not present

## 2019-02-16 DIAGNOSIS — Z91018 Allergy to other foods: Secondary | ICD-10-CM | POA: Diagnosis not present

## 2019-02-16 DIAGNOSIS — O418X10 Other specified disorders of amniotic fluid and membranes, first trimester, not applicable or unspecified: Secondary | ICD-10-CM | POA: Insufficient documentation

## 2019-02-16 DIAGNOSIS — O468X1 Other antepartum hemorrhage, first trimester: Secondary | ICD-10-CM | POA: Diagnosis not present

## 2019-02-16 DIAGNOSIS — Z3A12 12 weeks gestation of pregnancy: Secondary | ICD-10-CM | POA: Insufficient documentation

## 2019-02-16 DIAGNOSIS — Z3481 Encounter for supervision of other normal pregnancy, first trimester: Secondary | ICD-10-CM | POA: Diagnosis not present

## 2019-02-16 DIAGNOSIS — Z3682 Encounter for antenatal screening for nuchal translucency: Secondary | ICD-10-CM | POA: Diagnosis not present

## 2019-02-16 LAB — URINALYSIS, ROUTINE W REFLEX MICROSCOPIC
Bilirubin Urine: NEGATIVE
Glucose, UA: NEGATIVE mg/dL
Ketones, ur: NEGATIVE mg/dL
Leukocytes,Ua: NEGATIVE
Nitrite: NEGATIVE
Protein, ur: 30 mg/dL — AB
Specific Gravity, Urine: 1.004 — ABNORMAL LOW (ref 1.005–1.030)
pH: 6 (ref 5.0–8.0)

## 2019-02-16 NOTE — Discharge Instructions (Signed)
Subchorionic Hematoma ° °A subchorionic hematoma is a gathering of blood between the outer wall of the embryo (chorion) and the inner wall of the womb (uterus). °This condition can cause vaginal bleeding. If they cause little or no vaginal bleeding, early small hematomas usually shrink on their own and do not affect your baby or pregnancy. When bleeding starts later in pregnancy, or if the hematoma is larger or occurs in older pregnant women, the condition may be more serious. Larger hematomas may get bigger, which increases the chances of miscarriage. This condition also increases the risk of: °· Premature separation of the placenta from the uterus. °· Premature (preterm) labor. °· Stillbirth. °What are the causes? °The exact cause of this condition is not known. It occurs when blood is trapped between the placenta and the uterine wall because the placenta has separated from the original site of implantation. °What increases the risk? °You are more likely to develop this condition if: °· You were treated with fertility medicines. °· You conceived through in vitro fertilization (IVF). °What are the signs or symptoms? °Symptoms of this condition include: °· Vaginal spotting or bleeding. °· Contractions of the uterus. These cause abdominal pain. °Sometimes you may have no symptoms and the bleeding may only be seen when ultrasound images are taken (transvaginal ultrasound). °How is this diagnosed? °This condition is diagnosed based on a physical exam. This includes a pelvic exam. You may also have other tests, including: °· Blood tests. °· Urine tests. °· Ultrasound of the abdomen. °How is this treated? °Treatment for this condition can vary. Treatment may include: °· Watchful waiting. You will be monitored closely for any changes in bleeding. During this stage: °? The hematoma may be reabsorbed by the body. °? The hematoma may separate the fluid-filled space containing the embryo (gestational sac) from the wall of the  womb (endometrium). °· Medicines. °· Activity restriction. This may be needed until the bleeding stops. °Follow these instructions at home: °· Stay on bed rest if told to do so by your health care provider. °· Do not lift anything that is heavier than 10 lbs. (4.5 kg) or as told by your health care provider. °· Do not use any products that contain nicotine or tobacco, such as cigarettes and e-cigarettes. If you need help quitting, ask your health care provider. °· Track and write down the number of pads you use each day and how soaked (saturated) they are. °· Do not use tampons. °· Keep all follow-up visits as told by your health care provider. This is important. Your health care provider may ask you to have follow-up blood tests or ultrasound tests or both. °Contact a health care provider if: °· You have any vaginal bleeding. °· You have a fever. °Get help right away if: °· You have severe cramps in your stomach, back, abdomen, or pelvis. °· You pass large clots or tissue. Save any tissue for your health care provider to look at. °· You have more vaginal bleeding, and you faint or become lightheaded or weak. °Summary °· A subchorionic hematoma is a gathering of blood between the outer wall of the placenta and the uterus. °· This condition can cause vaginal bleeding. °· Sometimes you may have no symptoms and the bleeding may only be seen when ultrasound images are taken. °· Treatment may include watchful waiting, medicines, or activity restriction. °This information is not intended to replace advice given to you by your health care provider. Make sure you discuss any questions you   have with your health care provider. °Document Released: 10/01/2006 Document Revised: 05/29/2017 Document Reviewed: 08/12/2016 °Elsevier Patient Education © 2020 Elsevier Inc. ° °

## 2019-02-16 NOTE — MAU Provider Note (Signed)
History     CSN: [id number]  Arrival date and time: 02/16/19 1222   First Provider Initiated Contact with Patient 02/16/19 1313      Chief Complaint  Patient presents with  . Vaginal Bleeding   Kelly Navarro is a 33 y.o. G2P0 at 49w3dwho presents today with vaginal bleeding. She reports that the bleeding started on 02/13/2019 as brown spotting. She had [place] in [place]. She states that they saw "clot". After she got home from the [place]. She had a large gush of bright red bleeding. She denies any pain.   Vaginal Bleeding The patient's primary symptoms include vaginal bleeding. The patient's pertinent negatives include no pelvic pain. This is a new problem. The current episode started in the past 7 days. The problem occurs intermittently. The problem has been gradually worsening. The patient is experiencing no pain. She is pregnant. Pertinent negatives include no chills, dysuria, fever, frequency, nausea or vomiting. The vaginal discharge was bloody. The vaginal bleeding is heavier than menses. She has not been passing clots. She has not been passing tissue. Nothing aggravates the symptoms. She has tried nothing for the symptoms. Her menstrual history has been regular (11/21/2018).    OB History    Gravida  2   Para      Term      Preterm      AB      Living        SAB      TAB      Ectopic      Multiple      Live Births              Past Medical History:  Diagnosis Date  . Bee sting allergy   . 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 Eczema   . Gaucher disease, type I ([place]    noted on [facility] genetic testing 05/2015  . GERD (gastroesophageal reflux disease)   . Headache(784.0)    menstrual   . IBS (irritable bowel syndrome)   . Scoliosis    very mild    Past Surgical History:  Procedure Laterality Date  . CESAREAN SECTION N/A  10/21/2016   Procedure: CESAREAN SECTION;  Surgeon: MLinda Hedges DO;  [place] [facility]  Service: Obstetrics;  Laterality: N/A;  . GUM SURGERY    . WISDOM TOOTH EXTRACTION      Family History  Problem Relation Age of Onset  . Hypertension Mother   . Asthma Sister   . Eczema Sister   . Diabetes Maternal Grandfather   . Hypertension Maternal Grandfather   . Heart disease Maternal Grandfather   . Breast cancer Paternal Grandmother        Age 33 . Cancer Paternal Grandfather        multiple myeloma    Social History   Tobacco Use  . Smoking status: Never Smoker  . Smokeless tobacco: Never Used  Substance Use Topics  . Alcohol use: Yes    Alcohol/week: 1.0 standard drinks    Types: 1 Standard drinks or equivalent per week    Comment: none with pregnancy  . Drug use: No    Allergies:  Allergies  Allergen Reactions  . Amoxicillin Hives    Has patient had a PCN reaction causing immediate rash, facial/tongue/throat swelling, SOB or lightheadedness with hypotension: Yes Has patient had a PCN reaction causing  severe rash involving mucus membranes or skin necrosis: No Has patient had a PCN reaction that required hospitalization No Has patient had a PCN reaction occurring within the last 10 years: No If all of the above answers are "NO", then may proceed with Cephalosporin use.  . Bee Venom Other (See Comments)    Tongue swelling  . Macrodantin [Nitrofurantoin Macrocrystal] Nausea And Vomiting  . Penicillins Hives    Has patient had a PCN reaction causing immediate rash, facial/tongue/throat swelling, SOB or lightheadedness with hypotension: Yes Has patient had a PCN reaction causing severe rash involving mucus membranes or skin necrosis: No Has patient had a PCN reaction that required hospitalization No Has patient had a PCN reaction occurring within the last 10 years: No If all of the above answers are "NO", then may proceed with Cephalosporin use.  Marland Kitchen Pineapple      Itchy throat  . Sulfa Antibiotics Hives    Medications Prior to Admission  Medication Sig Dispense Refill Last Dose  . acetaminophen (TYLENOL) 500 MG tablet Take 1,000 mg by mouth every 6 (six) hours as needed.       Review of Systems  Constitutional: Negative for chills and fever.  Gastrointestinal: Negative for nausea and vomiting.  Genitourinary: Positive for vaginal bleeding. Negative for dysuria, frequency and pelvic pain.   Physical Exam   Blood pressure 130/82, pulse (!) 106, temperature 98 F (36.7 C), resp. rate 16, last menstrual period 11/21/2018, SpO2 100 %.  Physical Exam  Nursing note and vitals reviewed. Constitutional: She is oriented to person, place, and time. She appears well-developed and well-nourished. No distress.  HENT:  Head: Normocephalic.  Cardiovascular: Normal rate.  Respiratory: Effort normal.  GI: Soft. There is no abdominal tenderness. There is no rebound.  Genitourinary:    Genitourinary Comments:  External: no lesion Vagina: small amount of bright red blood in the vagina.  Cervix: pink, smooth, closed/thick  Uterus: AGA +FHT 150 with ultrasound     Neurological: She is alert and oriented to person, place, and time.  Skin: Skin is warm and dry.  Psychiatric: She has a normal mood and affect.   Pt informed that the ultrasound is considered a limited OB ultrasound and is not intended to be a complete ultrasound exam.  Patient also informed that the ultrasound is not being completed with the intent of assessing for fetal or placental anomalies or any pelvic abnormalities.  Explained that the purpose of today's ultrasound is to assess for  viability.  Patient acknowledges the purpose of the exam and the limitations of the study.    +FHT with [place]   MAU Course  Procedures  MDM   Assessment and Plan   1. Subchorionic hematoma in first trimester, single or unspecified fetus   2. Vaginal bleeding in pregnancy, first trimester   3. [redacted]  weeks gestation of pregnancy    DC home 1st/2nd Trimester precautions  Bleeding precautions RX: none  Return to MAU as needed FU with OB as planned  Follow-up Information    Morris, Megan, DO Follow up.   Specialty: Obstetrics and Gynecology Contact information: [address] [place], [place] [address] [place] DNP, CNM  02/16/19  2:31 PM

## 2019-02-16 NOTE — MAU Note (Signed)
.   Kelly Navarro is a 33 y.o. at [redacted]w[redacted]d here in MAU reporting:thin vaginal bleeding. Had U/S at office today and was told she had a clot and pt is concerned bcz she starting having bleeding after she got home.  LMP: 11/21/18 Onset of complaint: today Pain score: 0 Vitals:   02/16/19 1301  BP: 130/82  Pulse: (!) 106  Resp: 16  Temp: 98 F (36.7 C)  SpO2: 100%     FHT:166  Lab orders placed from triage: UA

## 2019-02-18 ENCOUNTER — Encounter (HOSPITAL_COMMUNITY): Payer: Self-pay | Admitting: *Deleted

## 2019-02-18 ENCOUNTER — Inpatient Hospital Stay (HOSPITAL_COMMUNITY)
Admission: AD | Admit: 2019-02-18 | Discharge: 2019-02-19 | Disposition: A | Discharge status: Home / Self Care | Payer: BC Managed Care – PPO | Attending: Obstetrics and Gynecology | Admitting: Obstetrics and Gynecology

## 2019-02-18 ENCOUNTER — Other Ambulatory Visit: Payer: Self-pay

## 2019-02-18 DIAGNOSIS — Z8249 Family history of ischemic heart disease and other diseases of the circulatory system: Secondary | ICD-10-CM | POA: Insufficient documentation

## 2019-02-18 DIAGNOSIS — Z3A12 12 weeks gestation of pregnancy: Secondary | ICD-10-CM

## 2019-02-18 DIAGNOSIS — N939 Abnormal uterine and vaginal bleeding, unspecified: Secondary | ICD-10-CM | POA: Diagnosis not present

## 2019-02-18 DIAGNOSIS — O34219 Maternal care for unspecified type scar from previous cesarean delivery: Secondary | ICD-10-CM | POA: Diagnosis not present

## 2019-02-18 DIAGNOSIS — Z882 Allergy status to sulfonamides status: Secondary | ICD-10-CM | POA: Diagnosis not present

## 2019-02-18 DIAGNOSIS — O4691 Antepartum hemorrhage, unspecified, first trimester: Secondary | ICD-10-CM | POA: Diagnosis not present

## 2019-02-18 DIAGNOSIS — O468X1 Other antepartum hemorrhage, first trimester: Secondary | ICD-10-CM

## 2019-02-18 DIAGNOSIS — Z880 Allergy status to penicillin: Secondary | ICD-10-CM | POA: Diagnosis not present

## 2019-02-18 DIAGNOSIS — O4690 Antepartum hemorrhage, unspecified, unspecified trimester: Secondary | ICD-10-CM

## 2019-02-18 DIAGNOSIS — O418X10 Other specified disorders of amniotic fluid and membranes, first trimester, not applicable or unspecified: Secondary | ICD-10-CM

## 2019-02-18 DIAGNOSIS — O360910 Maternal care for other rhesus isoimmunization, first trimester, not applicable or unspecified: Secondary | ICD-10-CM | POA: Diagnosis not present

## 2019-02-18 DIAGNOSIS — O034 Incomplete spontaneous abortion without complication: Secondary | ICD-10-CM | POA: Diagnosis not present

## 2019-02-18 DIAGNOSIS — Z883 Allergy status to other anti-infective agents status: Secondary | ICD-10-CM | POA: Insufficient documentation

## 2019-02-18 DIAGNOSIS — O208 Other hemorrhage in early pregnancy: Secondary | ICD-10-CM | POA: Diagnosis not present

## 2019-02-18 DIAGNOSIS — Z6790 Unspecified blood type, Rh positive: Secondary | ICD-10-CM

## 2019-02-18 LAB — CBC
HCT: 30.5 % — ABNORMAL LOW (ref 36.0–46.0)
Hemoglobin: 10.4 g/dL — ABNORMAL LOW (ref 12.0–15.0)
MCH: 29.8 pg (ref 26.0–34.0)
MCHC: 34.1 g/dL (ref 30.0–36.0)
MCV: 87.4 fL (ref 80.0–100.0)
Platelets: 185 10*3/uL (ref 150–400)
RBC: 3.49 MIL/uL — ABNORMAL LOW (ref 3.87–5.11)
RDW: 12.8 % (ref 11.5–15.5)
WBC: 8.7 10*3/uL (ref 4.0–10.5)
nRBC: 0 % (ref 0.0–0.2)

## 2019-02-18 NOTE — MAU Note (Signed)
Had a gush of blood tonight about 2000. Was seen Weds and told has subchorionic hemorrhage. Tonight's bleeding heavier and felt alittle lightheaded. No pain

## 2019-02-18 NOTE — MAU Provider Note (Addendum)
History     CSN: [id number]  Arrival date and time: 02/18/19 2104   First Provider Initiated Contact with Patient 02/18/19 2214      Chief Complaint  Patient presents with  . Vaginal Bleeding   [id number] _0 .5 wks with known [facility] presenting with VB. Pt seen in MAU 2 days ago for VB and had almost stopped until 8 pm tonight when she had a gush of red blood that went down her thighs. She soaked a pad over 2 hrs. Passed some small clots. Denies cramping but feels pelvic pressure when she feels the blood come out.   OB History    Gravida  3   Para  1   Term  1   Preterm      AB  1   Living  1     SAB  1   TAB      Ectopic      Multiple      Live Births  1           Past Medical History:  Diagnosis Date  . Bee sting allergy   . 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 Eczema   . 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 GERD (gastroesophageal reflux disease)   . Headache(784.0)    menstrual   . IBS (irritable bowel syndrome)   . Scoliosis    very mild    Past Surgical History:  Procedure Laterality Date  . CESAREAN SECTION N/A 10/21/2016   Procedure: CESAREAN SECTION;  Surgeon: Linda Hedges, DO;  Location: [facility];  Service: Obstetrics;  Laterality: N/A;  . GUM SURGERY    . WISDOM TOOTH EXTRACTION      Family History  Problem Relation Age of Onset  . Hypertension Mother   . Asthma Sister   . Eczema Sister   . Diabetes Maternal Grandfather   . Hypertension Maternal Grandfather   . Heart disease Maternal Grandfather   . Breast cancer Paternal Grandmother        Age 33  . Cancer Paternal Grandfather        multiple myeloma    Social History   Tobacco Use  . Smoking status: Never Smoker  . Smokeless tobacco: Never Used  Substance Use Topics  . Alcohol use: Yes    Alcohol/week: 1.0  standard drinks    Types: 1 Standard drinks or equivalent per week    Comment: none with pregnancy  . Drug use: No    Allergies:  Allergies  Allergen Reactions  . Amoxicillin Hives    Has patient had a PCN reaction causing immediate rash, facial/tongue/throat swelling, SOB or lightheadedness with hypotension: Yes Has patient had a PCN reaction causing severe rash involving mucus membranes or skin necrosis: No Has patient had a PCN reaction that required hospitalization No Has patient had a PCN reaction occurring within the last 10 years: No If all of the above answers are "NO", then may proceed with Cephalosporin use.  . Bee Venom Other (See Comments)    Tongue swelling  . Macrodantin [Nitrofurantoin Macrocrystal] Nausea And Vomiting  . Penicillins Hives    Has patient had a PCN reaction causing immediate rash, facial/tongue/throat swelling, SOB or lightheadedness with hypotension: Yes Has patient had a PCN reaction causing severe rash involving mucus membranes or skin necrosis: No Has patient had a  PCN reaction that required hospitalization No Has patient had a PCN reaction occurring within the last 10 years: No If all of the above answers are "NO", then may proceed with Cephalosporin use.  Marland Kitchen Pineapple     Itchy throat  . Sulfa Antibiotics Hives    No medications prior to admission.    Review of Systems  Constitutional: Negative for chills and fever.  Gastrointestinal: Positive for abdominal pain and nausea.  Genitourinary: Positive for vaginal bleeding.  Neurological: Positive for light-headedness. Negative for syncope.   Physical Exam   Blood pressure 113/62, pulse 89, temperature 98.4 F (36.9 C), resp. rate 18, height '5\' 8"'$  (1.727 m), weight 84.4 kg, last menstrual period 11/21/2018, SpO2 98 %. Patient Vitals for the past 24 hrs:  BP Temp Pulse Resp SpO2 Height Weight  02/19/19 0130 113/62 - 89 - - - -  02/19/19 0115 112/69 - 85 - - - -  02/19/19 0100 111/65 - 90 -  - - -  02/19/19 0053 106/73 - 92 - - - -  02/19/19 0046 103/74 - (!) 103 - - - -  02/19/19 0014 122/66 - 98 18 - - -  02/18/19 2349 115/65 - 85 16 - - -  02/18/19 2228 - - - - 98 % - -  02/18/19 2137 - - - - 100 % - -  02/18/19 2136 126/83 - (!) 105 - - - -  02/18/19 2135 - 98.4 F (36.9 C) - 16 - '5\' 8"'$  (1.727 m) 84.4 kg    Orthostatic VS for the past 24 hrs:  BP- Lying Pulse- Lying BP- Sitting Pulse- Sitting BP- Standing at 0 minutes Pulse- Standing at 0 minutes  02/18/19 2230 - - - - 108/76 122  02/18/19 2229 - - 115/81 100 - -  02/18/19 2228 117/78 91 - - - -   Physical Exam  Nursing note and vitals reviewed. Constitutional: She is oriented to person, place, and time. She appears well-developed and well-nourished. No distress.  HENT:  Head: Normocephalic and atraumatic.  Neck: Normal range of motion.  Cardiovascular: Normal rate.  Respiratory: Effort normal. No respiratory distress.  GI: Soft. She exhibits no distension and no mass. There is no abdominal tenderness. There is no rebound and no guarding.  Genitourinary:    Genitourinary Comments: External: no lesions or erythema Vagina: rugated, pink, moist, mod thin bloody discharge, cleared with 4 fox swabs, no active bleeding Cervix closed/long    Musculoskeletal: Normal range of motion.  Neurological: She is alert and oriented to person, place, and time.  Skin: Skin is warm and dry.  Psychiatric: She has a normal mood and affect.   Limited bedside US: viable, active fetus, +cardiac activity, subj. nml AFV  Results for orders placed or performed during the hospital encounter of 02/18/19 (from the past 24 hour(s))  CBC     Status: Abnormal   Collection Time: 02/18/19 10:48 PM  Result Value Ref Range   WBC 8.7 4.0 - 10.5 K/uL   RBC 3.49 (L) 3.87 - 5.11 MIL/uL   Hemoglobin 10.4 (L) 12.0 - 15.0 g/dL   HCT 30.5 (L) 36.0 - 46.0 %   MCV 87.4 80.0 - 100.0 fL   MCH 29.8 26.0 - 34.0 pg   MCHC 34.1 30.0 - 36.0 g/dL   RDW  12.8 11.5 - 15.5 %   Platelets 185 150 - 400 K/uL   nRBC 0.0 0.0 - 0.2 %   MAU Course  Procedures  MDM Labs ordered  and reviewed. Had 2 more episodes of bleeding. C/o nausea, usually takes Unisom and B6, Zofran ordered.  0130: Feeling better. Moderate amt of blood on peripad. Discussed strict SAB/bleeding precautions. Stable for discharge home.   Assessment and Plan   1. [redacted] weeks gestation of pregnancy   2. Vaginal bleeding in pregnancy   3. Subchorionic hematoma in first trimester, single or unspecified fetus   4. Blood type, Rh positive    Discharge home Follow up at [facility] next week- pt to call for appt SAB/bleeding precautions Rest  Allergies as of 02/19/2019      Reactions   Amoxicillin Hives   Has patient had a PCN reaction causing immediate rash, facial/tongue/throat swelling, SOB or lightheadedness with hypotension: Yes Has patient had a PCN reaction causing severe rash involving mucus membranes or skin necrosis: No Has patient had a PCN reaction that required hospitalization No Has patient had a PCN reaction occurring within the last 10 years: No If all of the above answers are "NO", then may proceed with Cephalosporin use.   Bee Venom Other (See Comments)   Tongue swelling   Macrodantin [nitrofurantoin Macrocrystal] Nausea And Vomiting   Penicillins Hives   Has patient had a PCN reaction causing immediate rash, facial/tongue/throat swelling, SOB or lightheadedness with hypotension: Yes Has patient had a PCN reaction causing severe rash involving mucus membranes or skin necrosis: No Has patient had a PCN reaction that required hospitalization No Has patient had a PCN reaction occurring within the last 10 years: No If all of the above answers are "NO", then may proceed with Cephalosporin use.   Pineapple    Itchy throat   Sulfa Antibiotics Hives      Medication List    TAKE these medications   acetaminophen 500 MG tablet Commonly known as:  TYLENOL Take 1,000 mg by mouth every 6 (six) hours as needed.   doxylamine (Sleep) 25 MG tablet Commonly known as: UNISOM Take 25 mg by mouth at bedtime as needed.   prenatal multivitamin Tabs tablet Take 1 tablet by mouth daily at 12 noon.   VITAMIN E/FOLIC QQIW/L-7/L-89 PO Take by mouth.      Julianne Handler, CNM 02/19/2019, 2:13 AM

## 2019-02-19 ENCOUNTER — Encounter (HOSPITAL_COMMUNITY): Payer: Self-pay

## 2019-02-19 ENCOUNTER — Inpatient Hospital Stay (EMERGENCY_DEPARTMENT_HOSPITAL)
Admission: AD | Admit: 2019-02-19 | Discharge: 2019-02-20 | Disposition: A | Discharge status: Home / Self Care | Payer: BC Managed Care – PPO | Source: Home / Self Care | Attending: Obstetrics and Gynecology | Admitting: Obstetrics and Gynecology

## 2019-02-19 DIAGNOSIS — O4691 Antepartum hemorrhage, unspecified, first trimester: Secondary | ICD-10-CM

## 2019-02-19 DIAGNOSIS — O418X10 Other specified disorders of amniotic fluid and membranes, first trimester, not applicable or unspecified: Secondary | ICD-10-CM

## 2019-02-19 DIAGNOSIS — O021 Missed abortion: Secondary | ICD-10-CM | POA: Diagnosis not present

## 2019-02-19 DIAGNOSIS — O468X1 Other antepartum hemorrhage, first trimester: Secondary | ICD-10-CM

## 2019-02-19 DIAGNOSIS — O034 Incomplete spontaneous abortion without complication: Secondary | ICD-10-CM

## 2019-02-19 DIAGNOSIS — O360910 Maternal care for other rhesus isoimmunization, first trimester, not applicable or unspecified: Secondary | ICD-10-CM

## 2019-02-19 DIAGNOSIS — Z3A12 12 weeks gestation of pregnancy: Secondary | ICD-10-CM

## 2019-02-19 DIAGNOSIS — D62 Acute posthemorrhagic anemia: Secondary | ICD-10-CM

## 2019-02-19 LAB — CBC
HCT: 27.6 % — ABNORMAL LOW (ref 36.0–46.0)
Hemoglobin: 9.2 g/dL — ABNORMAL LOW (ref 12.0–15.0)
MCH: 29.5 pg (ref 26.0–34.0)
MCHC: 33.3 g/dL (ref 30.0–36.0)
MCV: 88.5 fL (ref 80.0–100.0)
Platelets: 247 10*3/uL (ref 150–400)
RBC: 3.12 MIL/uL — ABNORMAL LOW (ref 3.87–5.11)
RDW: 13 % (ref 11.5–15.5)
WBC: 16.2 10*3/uL — ABNORMAL HIGH (ref 4.0–10.5)
nRBC: 0 % (ref 0.0–0.2)

## 2019-02-19 MED ORDER — PANTOPRAZOLE SODIUM 40 MG PO TBEC: 40.0000 mg | DELAYED_RELEASE_TABLET | Freq: Every day | ORAL | Status: DC

## 2019-02-19 MED ORDER — ONDANSETRON HCL 4 MG PO TABS
8.0000 mg | ORAL_TABLET | Freq: Once | ORAL | Status: AC
  Administered 2019-02-19: 8 mg via ORAL
  Filled 2019-02-19 (×2): qty 2

## 2019-02-19 MED ORDER — ONDANSETRON 4 MG PO TBDP
4.0000 mg | ORAL_TABLET | Freq: Once | ORAL | Status: AC
  Administered 2019-02-19: 4 mg via ORAL
  Filled 2019-02-19: qty 1

## 2019-02-19 MED ORDER — LACTATED RINGERS IV BOLUS
1000.0000 mL | Freq: Once | INTRAVENOUS | Status: AC
  Administered 2019-02-20: 1000 mL via INTRAVENOUS

## 2019-02-19 NOTE — Progress Notes (Signed)
Written and verbal d/c instructions given and understanding voiced. Bleeding less this time when up to BR. WIll call office Monday for appt. Did not want to wait for PRotonix. Will take Prilosec once home.

## 2019-02-19 NOTE — MAU Note (Signed)
Pt called out and RN went to ck on pt. Pt states she is bleeding more. Sitting on toilet and blood in toilet and pt is actively bleeding while sitting on toilet. Cleaned up and put on peripad as she was getting ready for d/c home. Will notify Colman Cater CNM

## 2019-02-19 NOTE — MAU Note (Signed)
In to check on pt. Pt reports she was walking around the room to try and pass POC and felt nauseous and dizzy so she got back in the bed. Reports she feels fine lying down. Pt asked if she could sit up straight to see if that would help. Upon sitting up, pt began to immediately feel lightheaded, dizzy, nauseous. Pt is pale and diaphoretic. BP 82/52, HR 115. MAU provider made aware

## 2019-02-19 NOTE — Discharge Instructions (Signed)
Managing Pregnancy Loss °Pregnancy loss can happen any time during a pregnancy. Often the cause is not known. It is rarely because of anything you did. Pregnancy loss in early pregnancy (during the first trimester) is called a miscarriage. This type of pregnancy loss is the most common. Pregnancy loss that happens after 20 weeks of pregnancy is called fetal demise if the baby's heart stops beating before birth. Fetal demise is much less common. Some women experience spontaneous labor shortly after fetal demise resulting in a stillborn birth (stillbirth). °Any pregnancy loss can be devastating. You will need to recover both physically and emotionally. Most women are able to get pregnant again after a pregnancy loss and deliver a healthy baby. °How to manage emotional recovery ° °Pregnancy loss is very hard emotionally. You may feel many different emotions while you grieve. You may feel sad and angry. You may also feel guilty. It is normal to have periods of crying. Emotional recovery can take longer than physical recovery. It is different for everyone. °Taking these steps can help you in managing this loss: °· Remember that it is unlikely you did anything to cause the pregnancy loss. °· Share your thoughts and feelings with friends, family, and your partner. Remember that your partner is also recovering emotionally. °· Make sure you have a good support system. Do not spend too much time alone. °· Meet with a pregnancy loss counselor or join a pregnancy loss support group. °· Get enough sleep and eat a healthy diet. Return to regular exercise when you have recovered physically. °· Do not use drugs or alcohol to manage your emotions. °· Consider seeing a mental health professional to help you recover emotionally. °· Ask a friend or loved one to help you decide what to do with any clothing and nursery items you received for your baby. °In the case of a stillbirth, many women benefit from taking additional steps in the  grieving process. You may want to: °· Hold your baby after the birth. °· Name your baby. °· Request a birth certificate. °· Create a keepsake such as handprints or footprints. °· Dress your baby and have a picture taken. °· Make funeral arrangements. °· Ask for a baptism or blessing. °Hospitals have staff members who can help you with all these arrangements. °How to recognize emotional stress °It is normal to have emotional stress after a pregnancy loss. But emotional stress that lasts a long time or becomes severe requires treatment. Watch out for these signs of severe emotional stress: °· Sadness, anger, or guilt that is not going away and is interfering with your normal activities. °· Relationship problems that have occurred or gotten worse since the pregnancy loss. °· Signs of depression that last longer than 2 weeks. These may include: °? Sadness. °? Anxiety. °? Hopelessness. °? Loss of interest in activities you enjoy. °? Inability to concentrate. °? Trouble sleeping or sleeping too much. °? Loss of appetite or overeating. °? Thoughts of death or of hurting yourself. °Follow these instructions at home: °· Take over-the-counter and prescription medicines only as told by your health care provider. °· Rest at home until your energy level returns. Return to your normal activities as told by your health care provider. Ask your health care provider what activities are safe for you. °· When you are ready, meet with your health care provider to discuss steps to take for a future pregnancy. °· Keep all follow-up visits as told by your health care provider. This is important. °  Where to find support °· To help you and your partner with the process of grieving, talk with your health care provider or seek counseling. °· Consider meeting with others who have experienced pregnancy loss. Ask your health care provider about support groups and resources. °Where to find more information °· [place] Department of Health and Human  Services Office on Women's Health: www.womenshealth.gov °· American Pregnancy Association: www.americanpregnancy.org °Contact a health care provider if: °· You continue to experience grief, sadness, or lack of motivation for everyday activities, and those feelings do not improve over time. °· You are struggling to recover emotionally, especially if you are using alcohol or substances to help. °Get help right away if: °· You have thoughts of hurting yourself or others. °If you ever feel like you may hurt yourself or others, or have thoughts about taking your own life, get help right away. You can go to your nearest emergency department or call: °· Your local emergency services (911 in the [place] °· A suicide crisis helpline, such as the National Suicide Prevention Lifeline at 1-800-273-8255. This is open 24 hours a day. °Summary °· Any pregnancy loss can be difficult physically and emotionally. °· You may experience many different emotions while you grieve. Emotional recovery can last longer than physical recovery. °· It is normal to have emotional stress after a pregnancy loss. But emotional stress that lasts a long time or becomes severe requires treatment. °· See your health care provider if you are struggling emotionally after a pregnancy loss. °This information is not intended to replace advice given to you by your health care provider. Make sure you discuss any questions you have with your health care provider. °Document Released: 08/27/2017 Document Revised: 10/06/2018 Document Reviewed: 08/27/2017 °Elsevier Patient Education © 2020 [employer] ° °

## 2019-02-19 NOTE — MAU Note (Signed)
Pt up to BR. Moderate bleeding on pad and felt like passed clot in toilet. Ambulates well without difficulty. Some nausea but usually takes diclegis at night and Prilosec in afternoon and had neither on Friday. Julianne Handler CNM aware and will order meds

## 2019-02-19 NOTE — Discharge Instructions (Signed)
Vaginal Bleeding During Pregnancy, First Trimester ° °A small amount of bleeding (spotting) from the vagina is common during early pregnancy. Sometimes the bleeding is normal and does not cause problems. At other times, though, bleeding may be a sign of something serious. Tell your doctor about any bleeding from your vagina right away. °Follow these instructions at home: °Activity °· Follow your doctor's instructions about how active you can be. °· If needed, make plans for someone to help with your normal activities. °· Do not have sex or orgasms until your doctor says that this is safe. °General instructions °· Take over-the-counter and prescription medicines only as told by your doctor. °· Watch your condition for any changes. °· Write down: °? The number of pads you use each day. °? How often you change pads. °? How soaked (saturated) your pads are. °· Do not use tampons. °· Do not douche. °· If you pass any tissue from your vagina, save it to show to your doctor. °· Keep all follow-up visits as told by your doctor. This is important. °Contact a doctor if: °· You have vaginal bleeding at any time while you are pregnant. °· You have cramps. °· You have a fever. °Get help right away if: °· You have very bad cramps in your back or belly (abdomen). °· You pass large clots or a lot of tissue from your vagina. °· Your bleeding gets worse. °· You feel light-headed. °· You feel weak. °· You pass out (faint). °· You have chills. °· You are leaking fluid from your vagina. °· You have a gush of fluid from your vagina. °Summary °· Sometimes vaginal bleeding during pregnancy is normal and does not cause problems. At other times, bleeding may be a sign of something serious. °· Tell your doctor about any bleeding from your vagina right away. °· Follow your doctor's instructions about how active you can be. You may need someone to help you with your normal activities. °This information is not intended to replace advice given to  you by your health care provider. Make sure you discuss any questions you have with your health care provider. °Document Released: 10/31/2013 Document Revised: 10/05/2018 Document Reviewed: 09/17/2016 °Elsevier Patient Education © 2020 [employer] ° °

## 2019-02-19 NOTE — MAU Note (Signed)
Pt here last night for vaginal bleeding. Reports bleeding was improving and everything was fine until around 1800. Continued to have a lot of bleeding and felt pelvic pressure and some gray matter came out. Pt reports mild cramping.

## 2019-02-19 NOTE — MAU Provider Note (Addendum)
History     CSN: [id number]  Arrival date and time: 02/19/19 2024   None     Chief Complaint  Patient presents with  . Vaginal Bleeding    Kelly Navarro is a 33 y.o. G3P1011 at 25w6dwho receives [facility]  She presents today for Vaginal Bleeding.  Patient states she has been bleeding for the past 2 days and was told that she has a "hematoma."  She states she was seen this morning and was discharged despite continued bleeding.  However, BSUS revealed fetal movement and heart tones.  Patient states around 6pm she started having heavy bleeding again that is saturating through pads.  Patient also states she had some "gray matter" that she passed.  Patient reports some mild cramping, but states it is tolerable. Of note, patient with yellow peri pad in place prior to arrival and that is now saturated.  Patient denies dizziness or SOB.        OB History    Gravida  3   Para  1   Term  1   Preterm      AB  1   Living  1     SAB  1   TAB      Ectopic      Multiple      Live Births  1           Past Medical History:  Diagnosis Date  . Bee sting allergy   . 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 Eczema   . Gaucher disease, type I ([place]    noted on [facility] genetic testing 05/2015  . GERD (gastroesophageal reflux disease)   . Headache(784.0)    menstrual   . IBS (irritable bowel syndrome)   . Scoliosis    very mild    Past Surgical History:  Procedure Laterality Date  . CESAREAN SECTION N/A 10/21/2016   Procedure: CESAREAN SECTION;  Surgeon: MLinda Hedges DO;  [facility]  Service: Obstetrics;  Laterality: N/A;  . GUM SURGERY    . WISDOM TOOTH EXTRACTION      Family History  Problem Relation Age of Onset  . Hypertension Mother   . Asthma Sister   . Eczema Sister   . Diabetes Maternal Grandfather    . Hypertension Maternal Grandfather   . Heart disease Maternal Grandfather   . Breast cancer Paternal Grandmother        Age 33 . Cancer Paternal Grandfather        multiple myeloma    Social History   Tobacco Use  . Smoking status: Never Smoker  . Smokeless tobacco: Never Used  Substance Use Topics  . Alcohol use: Yes    Alcohol/week: 1.0 standard drinks    Types: 1 Standard drinks or equivalent per week    Comment: none with pregnancy  . Drug use: No    Allergies:  Allergies  Allergen Reactions  . Amoxicillin Hives    Has patient had a PCN reaction causing immediate rash, facial/tongue/throat swelling, SOB or lightheadedness with hypotension: Yes Has patient had a PCN reaction causing severe rash involving mucus membranes or skin necrosis: No Has patient had a PCN reaction that required hospitalization No Has patient had a PCN reaction occurring within the last 10 years: No If all of the above answers are "NO", then may proceed with Cephalosporin use.  .Marland Kitchen  Bee Venom Other (See Comments)    Tongue swelling  . Macrodantin [Nitrofurantoin Macrocrystal] Nausea And Vomiting  . Penicillins Hives    Has patient had a PCN reaction causing immediate rash, facial/tongue/throat swelling, SOB or lightheadedness with hypotension: Yes Has patient had a PCN reaction causing severe rash involving mucus membranes or skin necrosis: No Has patient had a PCN reaction that required hospitalization No Has patient had a PCN reaction occurring within the last 10 years: No If all of the above answers are "NO", then may proceed with Cephalosporin use.  Marland Kitchen Pineapple     Itchy throat  . Sulfa Antibiotics Hives    Medications Prior to Admission  Medication Sig Dispense Refill Last Dose  . doxylamine, Sleep, (UNISOM) 25 MG tablet Take 25 mg by mouth at bedtime as needed.   02/19/2019 at Unknown time  . omeprazole (PRILOSEC) 20 MG capsule Take 20 mg by mouth daily.   02/19/2019 at Unknown time  .  Prenatal Vit-Fe Fumarate-FA (PRENATAL MULTIVITAMIN) TABS tablet Take 1 tablet by mouth daily at 12 noon.   02/19/2019 at Unknown time  . acetaminophen (TYLENOL) 500 MG tablet Take 1,000 mg by mouth every 6 (six) hours as needed.     . Multiple Vitamin (VITAMIN E/FOLIC UMPN/T-6/R-44 PO) Take by mouth.       Review of Systems  Constitutional: Negative for chills and fever.  Respiratory: Negative for cough and shortness of breath.   Gastrointestinal: Positive for nausea. Negative for constipation, diarrhea and vomiting.  Genitourinary: Positive for vaginal bleeding. Negative for difficulty urinating, dysuria and vaginal discharge.  Neurological: Positive for headaches. Negative for dizziness and light-headedness.   Physical Exam   Blood pressure 121/74, pulse (!) 119, temperature 98.9 F (37.2 C), temperature source Oral, resp. rate 18, last menstrual period 11/21/2018, SpO2 100 %.  Physical Exam  Constitutional: She is oriented to person, place, and time. She appears well-developed and well-nourished. No distress.  HENT:  Head: Normocephalic and atraumatic.  Eyes: Conjunctivae are normal.  Neck: Normal range of motion.  Cardiovascular: Normal rate.  Respiratory: Effort normal.  GI: There is no abdominal tenderness.  Genitourinary:    Vaginal bleeding present.  There is bleeding in the vagina.    Genitourinary Comments:  Speculum Exam: -Normal External Genitalia: Non tender, Moderate amt blood and clots noted at introitus. -Vaginal Vault:  Large amt blood and clots removed with ring forceps and (2) 4x4 gauzes.  Questionable Gestational Sac noted in vault. Patient instructed to bear down resulting in passing of clots and blood, but sac remains in place. No attempts at grasping sac, due to inability to visualize cervix.   -Cervix:Unable to visualize -Bimanual Exam:  Deferred  1126: Vaginal Vault: Gestational Sac remains in vault-Appears to have descended.  Patient instructed to bear down  with successful passing of multiple clots and sac. Contents to pathology. Cervix: Appears open, trailing membranes grasped with ring forceps x 2.  Small amt blood removed with ring forceps and one 4x4. No other apparent membranes noted at os.  No active bleeding.    Neurological: She is alert and oriented to person, place, and time.  Skin: Skin is warm and dry.  Psychiatric: She has a normal mood and affect. Her behavior is normal.     MAU Course  Procedures   Patient informed that the ultrasound is considered a limited OB ultrasound and is not intended to be a complete ultrasound exam.  Patient also informed that the ultrasound is not being  completed with the intent of assessing for fetal or placental anomalies or any pelvic abnormalities.  Explained that the purpose of today's ultrasound is to assess for  viability.  Patient acknowledges the purpose of the exam and the limitations of the study.    Results for orders placed or performed during the hospital encounter of 02/19/19 (from the past 24 hour(s))  CBC     Status: Abnormal   Collection Time: 02/19/19 10:27 PM  Result Value Ref Range   WBC 16.2 (H) 4.0 - 10.5 K/uL   RBC 3.12 (L) 3.87 - 5.11 MIL/uL   Hemoglobin 9.2 (L) 12.0 - 15.0 g/dL   HCT 27.6 (L) 36.0 - 46.0 %   MCV 88.5 80.0 - 100.0 fL   MCH 29.5 26.0 - 34.0 pg   MCHC 33.3 30.0 - 36.0 g/dL   RDW 13.0 11.5 - 15.5 %   Platelets 247 150 - 400 K/uL   nRBC 0.0 0.0 - 0.2 %    MDM Pelvic Exam CBC IV Fluids Assessment and Plan  33 year old G3P1011 SIUP at 12.6 weeks Incomplete Abortion  -Exam findings discussed. -Patient informed that unable to see cervix due to excessive bleeding, but findings indicative of POC.  -BSUS confirms no IUP. -Condolences given.  -Patient appropriately upset and given time to process. -Instructed to call out when ready to speak with provider further.   Maryann Conners MSN, CNM 02/19/2019, 9:47 PM   Reassessment (10:11 PM)  -Patient  calls out states she is ready to speak with provider. -In room to address questions and concerns including causes of loss, what to expect with passing of products, and follow up. -Reassured that patient was not cause for loss and most likely r/t known [facility]. -Informed that products are in vagina and she will most likely pass them soon.  -Further informed that she would follow up in her office in 1-2 weeks for repeat blood work.  -Patient states she does not want to pass products at home reporting fear of seeing fetus. -Offered and accepts MAU observation. -Instructed to try to ambulate to allow gravity to assist in passing products.  -Patient requests to speak with her chaplain if she is on call.  Chaplain paged, but patient's chaplain not on call tonight. -Will continue to monitor and reassess as appropriate.   Reassessment (11:26 PM) Anemia d/t Acute Blood Loss  -Nurse reports patient with dizziness and pale color. -In room to assess.  Patient states she has not passed contents yet. -Advised repeat speculum exam with attempt at removal and patient agreeable. -Exam performed and contents passed. -Patient reports relief with mild cramping.  -Offered and declines  -CBC now 9.2 which is decrease from 10.4. -Will give IV fluids now; Patient agreeable. -Suspected POC sent to pathology.   Reassessment (1:51 AM)  -Patient reports feeling "better." -Bleeding small with no clots noted on pad. -Discussed follow up in primary ob office in 1-2 weeks. -Patient states she would like to "touch base" with office on Monday. -Provider will send message to primary contact regarding patient evaluation, loss, and desire for office contact. -Instructed to continue PNV.  Further informed she may benefit from iron supplementation.  -R`x for iron 325 BID sent to pharmacy on file. -Patient denies any questions or concerns. -Encouraged to call or return to MAU if symptoms worsen or with the onset of new  symptoms. -Discharged to home in stable condition.  Maryann Conners MSN, CNM

## 2019-02-20 MED ORDER — FERROUS SULFATE 325 (65 FE) MG PO TABS: 325.0000 mg | ORAL_TABLET | Freq: Two times a day (BID) | ORAL | 1 refills | Status: DC

## 2019-02-21 DIAGNOSIS — D509 Iron deficiency anemia, unspecified: Secondary | ICD-10-CM | POA: Diagnosis not present

## 2019-02-21 DIAGNOSIS — O039 Complete or unspecified spontaneous abortion without complication: Secondary | ICD-10-CM | POA: Diagnosis not present

## 2019-02-22 ENCOUNTER — Encounter (HOSPITAL_COMMUNITY)
Admission: AD | Disposition: A | Discharge status: Home / Self Care | Payer: Self-pay | Source: Home / Self Care | Attending: Obstetrics and Gynecology

## 2019-02-22 ENCOUNTER — Other Ambulatory Visit: Payer: Self-pay

## 2019-02-22 ENCOUNTER — Inpatient Hospital Stay (HOSPITAL_COMMUNITY)
Admission: AD | Admit: 2019-02-22 | Discharge: 2019-02-22 | Disposition: A | Discharge status: Home / Self Care | Payer: BC Managed Care – PPO | Attending: Obstetrics and Gynecology | Admitting: Obstetrics and Gynecology

## 2019-02-22 ENCOUNTER — Inpatient Hospital Stay (HOSPITAL_COMMUNITY): Payer: BC Managed Care – PPO | Admitting: Certified Registered"

## 2019-02-22 ENCOUNTER — Encounter (HOSPITAL_COMMUNITY): Payer: Self-pay

## 2019-02-22 DIAGNOSIS — D649 Anemia, unspecified: Secondary | ICD-10-CM | POA: Insufficient documentation

## 2019-02-22 DIAGNOSIS — M1990 Unspecified osteoarthritis, unspecified site: Secondary | ICD-10-CM | POA: Insufficient documentation

## 2019-02-22 DIAGNOSIS — Z20828 Contact with and (suspected) exposure to other viral communicable diseases: Secondary | ICD-10-CM | POA: Diagnosis not present

## 2019-02-22 DIAGNOSIS — O034 Incomplete spontaneous abortion without complication: Secondary | ICD-10-CM | POA: Diagnosis not present

## 2019-02-22 DIAGNOSIS — Z882 Allergy status to sulfonamides status: Secondary | ICD-10-CM | POA: Diagnosis not present

## 2019-02-22 DIAGNOSIS — Z880 Allergy status to penicillin: Secondary | ICD-10-CM | POA: Diagnosis not present

## 2019-02-22 DIAGNOSIS — Z881 Allergy status to other antibiotic agents status: Secondary | ICD-10-CM | POA: Diagnosis not present

## 2019-02-22 DIAGNOSIS — K219 Gastro-esophageal reflux disease without esophagitis: Secondary | ICD-10-CM | POA: Diagnosis not present

## 2019-02-22 DIAGNOSIS — O037 Embolism following complete or unspecified spontaneous abortion: Secondary | ICD-10-CM | POA: Diagnosis not present

## 2019-02-22 DIAGNOSIS — Z79899 Other long term (current) drug therapy: Secondary | ICD-10-CM | POA: Insufficient documentation

## 2019-02-22 HISTORY — PX: DILATION AND EVACUATION: SHX1459

## 2019-02-22 HISTORY — DX: Incomplete spontaneous abortion without complication: O03.4

## 2019-02-22 LAB — CBC
HCT: 18.3 % — ABNORMAL LOW (ref 36.0–46.0)
Hemoglobin: 6.1 g/dL — CL (ref 12.0–15.0)
MCH: 30.5 pg (ref 26.0–34.0)
MCHC: 33.3 g/dL (ref 30.0–36.0)
MCV: 91.5 fL (ref 80.0–100.0)
Platelets: 159 10*3/uL (ref 150–400)
RBC: 2 MIL/uL — ABNORMAL LOW (ref 3.87–5.11)
RDW: 14.1 % (ref 11.5–15.5)
WBC: 8 10*3/uL (ref 4.0–10.5)
nRBC: 0 % (ref 0.0–0.2)

## 2019-02-22 LAB — RH IG WORKUP (INCLUDES ABO/RH)
ABO/RH(D): AB POS
Gestational Age(Wks): 12

## 2019-02-22 LAB — PREPARE RBC (CROSSMATCH)

## 2019-02-22 LAB — SARS CORONAVIRUS 2 BY RT PCR (HOSPITAL ORDER, PERFORMED IN ~~LOC~~ HOSPITAL LAB): SARS Coronavirus 2: NEGATIVE

## 2019-02-22 SURGERY — DILATION AND EVACUATION, UTERUS
Anesthesia: General | Site: Uterus

## 2019-02-22 MED ORDER — SUCCINYLCHOLINE CHLORIDE 20 MG/ML IJ SOLN
INTRAMUSCULAR | Status: DC | PRN
  Administered 2019-02-22: 140 mg via INTRAVENOUS

## 2019-02-22 MED ORDER — LORAZEPAM 2 MG/ML IJ SOLN
1.0000 mg | Freq: Once | INTRAMUSCULAR | Status: AC
  Administered 2019-02-22: 1 mg via INTRAVENOUS
  Filled 2019-02-22: qty 1

## 2019-02-22 MED ORDER — LIDOCAINE HCL 1 % IJ SOLN
INTRAMUSCULAR | Status: DC | PRN
  Administered 2019-02-22: 10 mL

## 2019-02-22 MED ORDER — SODIUM CHLORIDE 0.9 % IV SOLN
510.0000 mg | Freq: Once | INTRAVENOUS | Status: DC
  Filled 2019-02-22: qty 17

## 2019-02-22 MED ORDER — ONDANSETRON HCL 4 MG/2ML IJ SOLN
INTRAMUSCULAR | Status: DC | PRN
  Administered 2019-02-22: 4 mg via INTRAVENOUS

## 2019-02-22 MED ORDER — PROPOFOL 10 MG/ML IV BOLUS
INTRAVENOUS | Status: AC
  Filled 2019-02-22: qty 20

## 2019-02-22 MED ORDER — SODIUM CHLORIDE 0.9% IV SOLUTION: Freq: Once | INTRAVENOUS | Status: DC

## 2019-02-22 MED ORDER — MEPERIDINE HCL 25 MG/ML IJ SOLN: 6.2500 mg | INTRAMUSCULAR | Status: DC | PRN

## 2019-02-22 MED ORDER — METHYLERGONOVINE MALEATE 0.2 MG/ML IJ SOLN
INTRAMUSCULAR | Status: DC | PRN
  Administered 2019-02-22 (×2): 0.2 mg via INTRAMUSCULAR

## 2019-02-22 MED ORDER — HYDROMORPHONE HCL 1 MG/ML IJ SOLN: 0.2500 mg | INTRAMUSCULAR | Status: DC | PRN

## 2019-02-22 MED ORDER — LIDOCAINE 2% (20 MG/ML) 5 ML SYRINGE
INTRAMUSCULAR | Status: DC | PRN
  Administered 2019-02-22: 60 mg via INTRAVENOUS

## 2019-02-22 MED ORDER — LACTATED RINGERS IV SOLN
INTRAVENOUS | Status: DC | PRN
  Administered 2019-02-22: 16:00:00 via INTRAVENOUS

## 2019-02-22 MED ORDER — ACETAMINOPHEN 325 MG PO TABS: 325.0000 mg | ORAL_TABLET | Freq: Once | ORAL | Status: DC | PRN

## 2019-02-22 MED ORDER — MIDAZOLAM HCL 2 MG/2ML IJ SOLN
INTRAMUSCULAR | Status: AC
  Filled 2019-02-22: qty 2

## 2019-02-22 MED ORDER — 0.9 % SODIUM CHLORIDE (POUR BTL) OPTIME
TOPICAL | Status: DC | PRN
  Administered 2019-02-22: 1000 mL

## 2019-02-22 MED ORDER — KETOROLAC TROMETHAMINE 30 MG/ML IJ SOLN
30.0000 mg | Freq: Once | INTRAMUSCULAR | Status: AC
  Administered 2019-02-22: 16:00:00 30 mg via INTRAVENOUS
  Filled 2019-02-22: qty 1

## 2019-02-22 MED ORDER — METHYLERGONOVINE MALEATE 0.2 MG/ML IJ SOLN
INTRAMUSCULAR | Status: AC
  Filled 2019-02-22: qty 1

## 2019-02-22 MED ORDER — ACETAMINOPHEN 160 MG/5ML PO SOLN: 325.0000 mg | Freq: Once | ORAL | Status: DC | PRN

## 2019-02-22 MED ORDER — METHYLERGONOVINE MALEATE 0.2 MG PO TABS: 0.2000 mg | ORAL_TABLET | Freq: Four times a day (QID) | ORAL | 0 refills | Status: DC

## 2019-02-22 MED ORDER — PROMETHAZINE HCL 25 MG/ML IJ SOLN: 6.2500 mg | INTRAMUSCULAR | Status: DC | PRN

## 2019-02-22 MED ORDER — SODIUM CHLORIDE 0.9 % IV SOLN
INTRAVENOUS | Status: DC | PRN
  Administered 2019-02-22: 17:00:00 via INTRAVENOUS

## 2019-02-22 MED ORDER — LACTATED RINGERS IV SOLN
INTRAVENOUS | Status: DC
  Administered 2019-02-22: 16:00:00 via INTRAVENOUS

## 2019-02-22 MED ORDER — PHENYLEPHRINE 40 MCG/ML (10ML) SYRINGE FOR IV PUSH (FOR BLOOD PRESSURE SUPPORT)
PREFILLED_SYRINGE | INTRAVENOUS | Status: DC | PRN
  Administered 2019-02-22: 80 ug via INTRAVENOUS

## 2019-02-22 MED ORDER — ACETAMINOPHEN 10 MG/ML IV SOLN: 1000.0000 mg | Freq: Once | INTRAVENOUS | Status: DC | PRN

## 2019-02-22 MED ORDER — FENTANYL CITRATE (PF) 250 MCG/5ML IJ SOLN
INTRAMUSCULAR | Status: DC | PRN
  Administered 2019-02-22: 100 ug via INTRAVENOUS
  Administered 2019-02-22: 50 ug via INTRAVENOUS

## 2019-02-22 MED ORDER — PROPOFOL 10 MG/ML IV BOLUS
INTRAVENOUS | Status: DC | PRN
  Administered 2019-02-22: 150 mg via INTRAVENOUS

## 2019-02-22 MED ORDER — CLINDAMYCIN PHOSPHATE 900 MG/50ML IV SOLN
INTRAVENOUS | Status: DC | PRN
  Administered 2019-02-22: 900 mg via INTRAVENOUS

## 2019-02-22 MED ORDER — FENTANYL CITRATE (PF) 250 MCG/5ML IJ SOLN
INTRAMUSCULAR | Status: AC
  Filled 2019-02-22: qty 5

## 2019-02-22 MED ORDER — CLINDAMYCIN PHOSPHATE 900 MG/50ML IV SOLN
INTRAVENOUS | Status: AC
  Filled 2019-02-22: qty 50

## 2019-02-22 MED ORDER — LIDOCAINE HCL (PF) 1 % IJ SOLN
INTRAMUSCULAR | Status: AC
  Filled 2019-02-22: qty 30

## 2019-02-22 SURGICAL SUPPLY — 19 items
CATH ROBINSON RED A/P 16FR (CATHETERS) ×2 IMPLANT
DECANTER SPIKE VIAL GLASS SM (MISCELLANEOUS) ×2 IMPLANT
GLOVE BIO SURGEON STRL SZ 6.5 (GLOVE) ×2 IMPLANT
GLOVE BIOGEL PI IND STRL 7.0 (GLOVE) ×1 IMPLANT
GLOVE BIOGEL PI INDICATOR 7.0 (GLOVE) ×1
GOWN STRL REUS W/ TWL LRG LVL3 (GOWN DISPOSABLE) ×2 IMPLANT
GOWN STRL REUS W/TWL LRG LVL3 (GOWN DISPOSABLE) ×4
HOSE CONNECTING 18IN BERKELEY (TUBING) ×2 IMPLANT
KIT BERKELEY 1ST TRIMESTER 3/8 (MISCELLANEOUS) ×4 IMPLANT
NS IRRIG 1000ML POUR BTL (IV SOLUTION) ×2 IMPLANT
PACK VAGINAL MINOR WOMEN LF (CUSTOM PROCEDURE TRAY) ×2 IMPLANT
PAD OB MATERNITY 4.3X12.25 (PERSONAL CARE ITEMS) ×2 IMPLANT
SET BERKELEY SUCTION TUBING (SUCTIONS) ×2 IMPLANT
TOWEL GREEN STERILE FF (TOWEL DISPOSABLE) ×4 IMPLANT
UNDERPAD 30X30 (UNDERPADS AND DIAPERS) ×2 IMPLANT
VACURETTE 10 RIGID CVD (CANNULA) IMPLANT
VACURETTE 7MM CVD STRL WRAP (CANNULA) IMPLANT
VACURETTE 8 RIGID CVD (CANNULA) ×1 IMPLANT
VACURETTE 9 RIGID CVD (CANNULA) IMPLANT

## 2019-02-22 NOTE — Anesthesia Procedure Notes (Addendum)
Procedure Name: Intubation Performed by: Milford Cage, CRNA Pre-anesthesia Checklist: Patient identified, Emergency Drugs available, Suction available and Patient being monitored Patient Re-evaluated:Patient Re-evaluated prior to induction Oxygen Delivery Method: Circle System Utilized Preoxygenation: Pre-oxygenation with 100% oxygen Induction Type: IV induction and Rapid sequence Laryngoscope Size: Mac and 3 Grade View: Grade I Tube type: Oral Tube size: 7.0 mm Number of attempts: 1 Airway Equipment and Method: Stylet Placement Confirmation: ETT inserted through vocal cords under direct vision,  positive ETCO2 and breath sounds checked- equal and bilateral Secured at: 22 cm Tube secured with: Tape Dental Injury: Teeth and Oropharynx as per pre-operative assessment

## 2019-02-22 NOTE — MAU Note (Signed)
Had a miscarriage on Sat, from a subchorionic Hemorrhage. Has a pounding headache, gets light headed, and SOB, with activity. Pretty intense cramps last night, not as bad today. Was seen in office yesterday, Hgb is down to 6, was called and told she would need a D&C, to come here, for Dr Helane Rima.

## 2019-02-22 NOTE — Discharge Instructions (Signed)
Incomplete Miscarriage A miscarriage is the loss of an unborn baby (fetus) before the 20th week of pregnancy. In an incomplete miscarriage, parts of the fetus or placenta (afterbirth) remain in the body. Most miscarriages happen in the first 3 months of pregnancy. Sometimes, it happens before a woman even knows she is pregnant. Having a miscarriage can be an emotional experience. If you have had a miscarriage, talk with your health care provider about any questions you may have about miscarrying, the grieving process, and your future pregnancy plans. What are the causes? This condition may be caused by:  Problems with the genes or chromosomes that make it impossible for the baby to develop normally. These problems are most often the result of random errors that occur early in development, and are not passed from parent to child (not inherited).  Infection of the cervix or uterus.  Conditions that affect hormone balance in the body.  Problems with the cervix, such as the cervix opening and thinning before pregnancy is at term (cervical insufficiency).  Problems with the uterus, such as a uterus with an abnormal shape, fibroids in the uterus, or problems that were present from birth (congenital abnormalities).  Certain medical conditions.  Smoking, drinking alcohol, or using drugs.  Injury (trauma). Many times, the cause of a miscarriage is not known. What are the signs or symptoms? Symptoms of this condition include:  Vaginal bleeding or spotting, with or without cramps or pain.  Pain or cramping in the abdomen or lower back.  Passing fluid, tissue, or blood clots from the vagina. How is this diagnosed? This condition may be diagnosed based on:  A physical exam.  Ultrasound.  Blood tests.  Urine tests. How is this treated? An incomplete miscarriage may be treated with:  Dilation and curettage (D&C). This is a procedure in which the cervix is stretched open and the lining of  the uterus (endometrium) is scraped to remove any remaining tissue from the pregnancy.  Medicines, such as: ? Antibiotic medicine to treat infection. ? Medicine to help any remaining tissue pass out of your uterus. ? Medicine to reduce (contract) the size of the uterus. These medicines may be given if you have a lot of bleeding. If you have Rh negative blood and your baby was Rh positive, you will need a shot of medicine called Rh immunoglobulinto protect future babies from Rh blood problems. "Rh-negative" and "Rh-positive" refer to whether or not the blood has a specific protein found on the surface of red blood cells (Rh factor). Follow these instructions at home: Medicines   Take over-the-counter and prescription medicines only as told by your health care provider.  If you were prescribed antibiotic medicine, take your antibiotic as told by your health care provider. Do not stop taking the antibiotic even if you start to feel better.  Do not take NSAIDs, such as aspirin and ibuprofen, unless approved by your doctor. These medicines can cause bleeding. Activity  Rest as directed. Ask your health care provider what activities are safe for you.  Have someone help with home and family responsibilities during this time. General instructions  Keep track of the number of sanitary pads you use each day and how soaked (saturated) they are. Write down this information.  Monitor the amount of tissue or blood clots that you pass from your vagina. Save any large amounts of tissue for your health care provider to examine.  Do not use tampons, douche, or have sex until your health care provider   approves.  To help you and your partner with the process of grieving, talk with your health care provider or seek counseling to help cope with the pregnancy loss.  When you are ready, meet with your health care provider to discuss important steps you should take for your health, as well as steps to take in  order to have a healthy pregnancy in the future.  Keep all follow-up visits as told by your health care provider. This is important. Where to find more information  The American Congress of Obstetricians and Gynecologists: www.acog.org  U.S. Department of Health and Human Services Office of Women's Health: www.womenshealth.gov Contact a health care provider if:  You have a fever or chills.  You have a foul smelling vaginal discharge. Get help right away if:  You have severe cramps or pain in your back or abdomen.  You pass walnut-sized (or larger) blood clots or tissue from your vagina.  You have heavy bleeding, soaking more than 1 regular sanitary pad in an hour.  You become lightheaded or weak.  You pass out.  You have feelings of sadness that take over your thoughts, or you have thoughts of hurting yourself. Summary  In an incomplete miscarriage, parts of the fetus or placenta (afterbirth) remain in the body.  There are multiple treatment options for an incomplete miscarriage, talk to your health care provider about the best option for you.  Follow your health care provider's instructions for follow-up care.  To help you and your partner with the process of grieving, talk with your health care provider or seek counseling to help cope with the pregnancy loss. This information is not intended to replace advice given to you by your health care provider. Make sure you discuss any questions you have with your health care provider. Document Released: 06/16/2005 Document Revised: 07/23/2017 Document Reviewed: 07/23/2016 Elsevier Patient Education  2020 Elsevier Inc.  

## 2019-02-22 NOTE — H&P (Signed)
33 year old female with recent miscarriage presented to triage today with dizziness, and feeling weak and heavy bleeding. Seen in office yesterday - Retained products of conception seen and patient offered D and E.  In Triage - she has hemoglobin 6.1 and is tachycardic and cannot ambulate on her own  BP 123/72 (BP Location: Right Arm)   Pulse (!) 103   Temp 98.7 F (37.1 C) (Oral)   Resp 18   Ht 5\' 8"  (1.727 m)   Wt 82.9 kg   LMP 11/21/2018   SpO2 100%   BMI 27.79 kg/m   Results for orders placed or performed during the hospital encounter of 02/22/19 (from the past 24 hour(s))  Type and screen [place]     Status: None (Preliminary result)   Collection Time: 02/22/19  2:40 PM  Result Value Ref Range   ABO/RH(D) PENDING    Antibody Screen PENDING    Sample Expiration      02/25/2019,2359 Performed at [facility], [place] [address] [place], [place] [address]   CBC     Status: Abnormal   Collection Time: 02/22/19  2:41 PM  Result Value Ref Range   WBC 8.0 4.0 - 10.5 K/uL   RBC 2.00 (L) 3.87 - 5.11 MIL/uL   Hemoglobin 6.1 (LL) 12.0 - 15.0 g/dL   HCT 18.3 (L) 36.0 - 46.0 %   MCV 91.5 80.0 - 100.0 fL   MCH 30.5 26.0 - 34.0 pg   MCHC 33.3 30.0 - 36.0 g/dL   RDW 14.1 11.5 - 15.5 %   Platelets 159 150 - 400 K/uL   nRBC 0.0 0.0 - 0.2 %   Lung CTAB Car RRR Abdomen is soft and non tender  IMPRESSION: Incomplete Abortion  PLAN: D and E Consider transfusion F[place]

## 2019-02-22 NOTE — MAU Provider Note (Signed)
A:  Kelly Navarro is a 33 y.o. female G3P1011 Status post SAB over the weekend. Says she has continued to have heavy bleeding over the wkd. Now feels weak, fatigued, and dizzy when she stands up.  She was called by the office today and was told that her Hgb is low and she needs to present to the hospital for surgery.    O:  GENERAL: Well-developed, well-nourished female in no acute distress.  LUNGS: Effort normal SKIN: Warm, dry and without erythema PSYCH: Normal mood and affect, Pale appearance.   Blood pressure 123/72, pulse (!) 103, temperature 98.7 F (37.1 C), temperature source Oral, resp. rate 18, height 5\' 8"  (1.727 m), weight 82.9 kg, last menstrual period 11/21/2018, SpO2 100 %.   A:  SAB Symptomatic anemia   P:  Dr. Helane Rima aware of patient in MAU Discussed PRBC's Prep for OR Patient requesting something for anxiousness; ativan 1 mg given IV with toradol 30 mg for HA. Dr. Helane Rima to come to MAU to consent patient.    Lezlie Lye, NP 02/22/2019 3:58 PM

## 2019-02-22 NOTE — Anesthesia Preprocedure Evaluation (Addendum)
Anesthesia Evaluation  Patient identified by MRN, date of birth, ID band Patient awake    Reviewed: Allergy & Precautions, NPO status , Patient's Chart, lab work & pertinent test results  Airway Mallampati: I  TM Distance: >3 FB Neck ROM: Full    Dental  (+) Teeth Intact, Dental Advisory Given   Pulmonary    breath sounds clear to auscultation       Cardiovascular negative cardio ROS   Rhythm:Regular Rate:Tachycardia     Neuro/Psych  Headaches,    GI/Hepatic Neg liver ROS, GERD  Medicated,  Endo/Other  negative endocrine ROS  Renal/GU negative Renal ROS     Musculoskeletal  (+) Arthritis ,   Abdominal Normal abdominal exam  (+)   Peds  Hematology negative hematology ROS (+)   Anesthesia Other Findings   Reproductive/Obstetrics                            Lab Results  Component Value Date   WBC 8.0 02/22/2019   HGB 6.1 (LL) 02/22/2019   HCT 18.3 (L) 02/22/2019   MCV 91.5 02/22/2019   PLT 159 02/22/2019     Anesthesia Physical Anesthesia Plan  ASA: II and emergent  Anesthesia Plan: General   Post-op Pain Management:    Induction: Rapid sequence and Cricoid pressure planned  PONV Risk Score and Plan: 4 or greater and Ondansetron, Dexamethasone, Midazolam, Scopolamine patch - Pre-op and Treatment may vary due to age or medical condition  Airway Management Planned: Oral ETT  Additional Equipment: None  Intra-op Plan:   Post-operative Plan: Extubation in OR  Informed Consent: I have reviewed the patients History and Physical, chart, labs and discussed the procedure including the risks, benefits and alternatives for the proposed anesthesia with the patient or authorized representative who has indicated his/her understanding and acceptance.     Dental advisory given  Plan Discussed with: CRNA  Anesthesia Plan Comments: (COVID-19 Labs  No results for input(s): DDIMER,  FERRITIN, LDH, CRP in the last 72 hours.  Lab Results      Component                Value               Date                      [id number]              NEGATIVE            02/22/2019            )       Anesthesia Quick Evaluation

## 2019-02-22 NOTE — Brief Op Note (Signed)
02/22/2019  5:09 PM  PATIENT:  Nayely Flakes  33 y.o. female  PRE-OPERATIVE DIAGNOSIS:  Misccarriage  POST-OPERATIVE DIAGNOSIS:  Misccarriage  PROCEDURE:  Procedure(s): DILATATION AND EVACUATION (N/A)  SURGEON:  Surgeon(s) and Role:    * Dian Queen, MD - Primary  PHYSICIAN ASSISTANT:   ASSISTANTS: none   ANESTHESIA:   MAC  EBL:  100 cc   BLOOD ADMINISTERED:none  DRAINS: none   LOCAL MEDICATIONS USED:  LIDOCAINE   SPECIMEN:  Source of Specimen:  POC  DISPOSITION OF SPECIMEN:  PATHOLOGY  COUNTS:  YES  TOURNIQUET:  * No tourniquets in log *  DICTATION: .Other Dictation: Dictation Number dictated  PLAN OF CARE: Discharge to home after PACU  PATIENT DISPOSITION:  PACU - hemodynamically stable.   Delay start of Pharmacological VTE agent (>24hrs) due to surgical blood loss or risk of bleeding: not applicable

## 2019-02-22 NOTE — Transfer of Care (Signed)
Immediate Anesthesia Transfer of Care Note  Patient: Kelly Navarro  Procedure(s) Performed: DILATATION AND EVACUATION (N/A Uterus)  Patient Location: PACU  Anesthesia Type:General  Level of Consciousness: awake  Airway & Oxygen Therapy: Patient Spontanous Breathing and Patient connected to face mask oxygen  Post-op Assessment: Report given to RN and Post -op Vital signs reviewed and stable  Post vital signs: Reviewed and stable  Last Vitals:  Vitals Value Taken Time  BP    Temp    Pulse    Resp    SpO2      Last Pain:  Vitals:   02/22/19 1339  TempSrc:   PainSc: 3          Complications: No apparent anesthesia complications

## 2019-02-22 NOTE — Progress Notes (Signed)
CRITICAL VALUE ALERT  Critical Value:  Hgb 6.1  Date & Time Notied: 02/22/19 1516  Provider Notified: Dr Helane Rima, message left requested call back/orders.  Orders Received/Actions taken: waiting for call back and orders.

## 2019-02-23 ENCOUNTER — Encounter (HOSPITAL_COMMUNITY): Payer: Self-pay | Admitting: Obstetrics and Gynecology

## 2019-02-23 LAB — TYPE AND SCREEN
ABO/RH(D): AB POS
Antibody Screen: NEGATIVE
Unit division: 0
Unit division: 0

## 2019-02-23 LAB — BPAM RBC
Blood Product Expiration Date: 202008252359
Blood Product Expiration Date: 202008252359
ISSUE DATE / TIME: 202008251607
ISSUE DATE / TIME: 202008251607
Unit Type and Rh: 8400
Unit Type and Rh: 8400

## 2019-02-23 NOTE — Progress Notes (Signed)
Products of Conception, No villi or fetal parts grossly identified.

## 2019-02-23 NOTE — Anesthesia Postprocedure Evaluation (Signed)
Anesthesia Post Note  Patient: Kelly Navarro  Procedure(s) Performed: DILATATION AND EVACUATION (N/A Uterus)     Patient location during evaluation: PACU Anesthesia Type: General Level of consciousness: awake and alert Pain management: pain level controlled Vital Signs Assessment: post-procedure vital signs reviewed and stable Respiratory status: spontaneous breathing, nonlabored ventilation, respiratory function stable and patient connected to nasal cannula oxygen Cardiovascular status: blood pressure returned to baseline and stable Postop Assessment: no apparent nausea or vomiting Anesthetic complications: no    Last Vitals:  Vitals:   02/22/19 1725 02/22/19 1740  BP: 105/69 114/78  Pulse: 98 95  Resp: 15 17  Temp: 36.7 C   SpO2: 100% 100%    Last Pain:  Vitals:   02/22/19 1725  TempSrc:   PainSc: 0-No pain                 Effie Berkshire

## 2019-02-25 NOTE — Op Note (Signed)
Kelly Navarro, KNICK MEDICAL RECORD [id number] ACCOUNT [id number] DATE OF BIRTH:03/01/1986 FACILITY: [facility] LOCATION: [facility] PHYSICIAN:Jacquel Redditt Lynett Fish, MD  OPERATIVE REPORT  DATE OF PROCEDURE:  02/22/2019  PREOPERATIVE DIAGNOSIS:  Incomplete miscarriage and retained products of conception.  POSTOPERATIVE DIAGNOSIS:  Incomplete miscarriage and retained products of conception.    PROCEDURE:  Dilation and evacuation.    SURGEON:  Dian Queen, MD  ESTIMATED BLOOD LOSS:  100 mL.  COMPLICATIONS:  None.  DRAINS:  None.  DESCRIPTION OF PROCEDURE:  The patient was taken to the operating room after informed consent was obtained.  She was then prepped and draped in the usual sterile fashion.  She was placed in the lithotomy position.  A speculum was inserted into the  vagina.  The cervix was grasped with a tenaculum and the cervical internal os was gently dilated using Pratt dilators after paracervical block was performed.  A #8 suction cannula was inserted and with the suction curettage, I could see retrieval of  contents consistent grossly with products of conception, decidual tissue and clots.  This was performed x2.  A sharp curette was inserted, and the uterus was thoroughly curetted of all tissue.  We then did a final suction curettage and the uterine cavity  was clean.  I decided to give her some Methergine in the operating room just because I did not want her to have any further hemorrhage.  Of note, preprocedure hemoglobin was 6.0.  She was symptomatic with orthostatic symptoms.  After discussion with Dr.  Smith Robert of anesthesia, we felt she would benefit clinically from 2 units of packed red blood cells.  The patient had consented to this prior to doing the procedure.  Of note, she is AB positive.  At the end of the procedure no excessive bleeding was  noted.  All sponge, lap and instrument counts were correct x2.  The patient went to the recovery room in stable  condition.  TN/NUANCE  D:02/25/2019 T:02/25/2019 JOB:[id number]

## 2019-02-28 DIAGNOSIS — N96 Recurrent pregnancy loss: Secondary | ICD-10-CM | POA: Diagnosis not present

## 2019-02-28 DIAGNOSIS — F419 Anxiety disorder, unspecified: Secondary | ICD-10-CM | POA: Diagnosis not present

## 2019-03-03 DIAGNOSIS — O039 Complete or unspecified spontaneous abortion without complication: Secondary | ICD-10-CM | POA: Diagnosis not present

## 2019-03-11 DIAGNOSIS — O021 Missed abortion: Secondary | ICD-10-CM | POA: Diagnosis not present

## 2019-03-14 DIAGNOSIS — F430 Acute stress reaction: Secondary | ICD-10-CM | POA: Diagnosis not present

## 2019-03-18 DIAGNOSIS — O021 Missed abortion: Secondary | ICD-10-CM | POA: Diagnosis not present

## 2019-03-21 ENCOUNTER — Encounter: Payer: Self-pay | Admitting: Gynecology

## 2019-03-21 DIAGNOSIS — F430 Acute stress reaction: Secondary | ICD-10-CM | POA: Diagnosis not present

## 2019-03-25 DIAGNOSIS — O021 Missed abortion: Secondary | ICD-10-CM | POA: Diagnosis not present

## 2019-03-31 DIAGNOSIS — O2621 Pregnancy care for patient with recurrent pregnancy loss, first trimester: Secondary | ICD-10-CM | POA: Diagnosis not present

## 2019-03-31 DIAGNOSIS — Z319 Encounter for procreative management, unspecified: Secondary | ICD-10-CM | POA: Diagnosis not present

## 2019-03-31 DIAGNOSIS — F430 Acute stress reaction: Secondary | ICD-10-CM | POA: Diagnosis not present

## 2019-03-31 DIAGNOSIS — Z3183 Encounter for assisted reproductive fertility procedure cycle: Secondary | ICD-10-CM | POA: Diagnosis not present

## 2019-04-04 DIAGNOSIS — F430 Acute stress reaction: Secondary | ICD-10-CM | POA: Diagnosis not present

## 2019-04-08 DIAGNOSIS — Z3202 Encounter for pregnancy test, result negative: Secondary | ICD-10-CM | POA: Diagnosis not present

## 2019-04-08 DIAGNOSIS — Z3141 Encounter for fertility testing: Secondary | ICD-10-CM | POA: Diagnosis not present

## 2019-04-11 DIAGNOSIS — F430 Acute stress reaction: Secondary | ICD-10-CM | POA: Diagnosis not present

## 2019-04-18 DIAGNOSIS — L309 Dermatitis, unspecified: Secondary | ICD-10-CM | POA: Diagnosis not present

## 2019-04-25 DIAGNOSIS — F430 Acute stress reaction: Secondary | ICD-10-CM | POA: Diagnosis not present

## 2019-04-28 DIAGNOSIS — Z113 Encounter for screening for infections with a predominantly sexual mode of transmission: Secondary | ICD-10-CM | POA: Diagnosis not present

## 2019-05-05 DIAGNOSIS — N96 Recurrent pregnancy loss: Secondary | ICD-10-CM | POA: Diagnosis not present

## 2019-05-05 DIAGNOSIS — N840 Polyp of corpus uteri: Secondary | ICD-10-CM | POA: Diagnosis not present

## 2019-05-05 DIAGNOSIS — Z3183 Encounter for assisted reproductive fertility procedure cycle: Secondary | ICD-10-CM | POA: Diagnosis not present

## 2019-05-05 DIAGNOSIS — O2621 Pregnancy care for patient with recurrent pregnancy loss, first trimester: Secondary | ICD-10-CM | POA: Diagnosis not present

## 2019-05-05 DIAGNOSIS — Q5128 Other doubling of uterus, other specified: Secondary | ICD-10-CM | POA: Diagnosis not present

## 2019-05-05 DIAGNOSIS — Z113 Encounter for screening for infections with a predominantly sexual mode of transmission: Secondary | ICD-10-CM | POA: Diagnosis not present

## 2019-05-09 DIAGNOSIS — F430 Acute stress reaction: Secondary | ICD-10-CM | POA: Diagnosis not present

## 2019-05-18 DIAGNOSIS — E288 Other ovarian dysfunction: Secondary | ICD-10-CM | POA: Diagnosis not present

## 2019-05-18 DIAGNOSIS — N96 Recurrent pregnancy loss: Secondary | ICD-10-CM | POA: Diagnosis not present

## 2019-05-23 DIAGNOSIS — F430 Acute stress reaction: Secondary | ICD-10-CM | POA: Diagnosis not present

## 2019-06-06 DIAGNOSIS — F430 Acute stress reaction: Secondary | ICD-10-CM | POA: Diagnosis not present

## 2019-06-20 DIAGNOSIS — F430 Acute stress reaction: Secondary | ICD-10-CM | POA: Diagnosis not present

## 2019-07-11 DIAGNOSIS — F430 Acute stress reaction: Secondary | ICD-10-CM | POA: Diagnosis not present

## 2019-07-25 DIAGNOSIS — D6859 Other primary thrombophilia: Secondary | ICD-10-CM | POA: Diagnosis not present

## 2019-07-25 DIAGNOSIS — E288 Other ovarian dysfunction: Secondary | ICD-10-CM | POA: Diagnosis not present

## 2019-07-25 DIAGNOSIS — Z3200 Encounter for pregnancy test, result unknown: Secondary | ICD-10-CM | POA: Diagnosis not present

## 2019-07-25 DIAGNOSIS — N96 Recurrent pregnancy loss: Secondary | ICD-10-CM | POA: Diagnosis not present

## 2019-07-27 DIAGNOSIS — D6859 Other primary thrombophilia: Secondary | ICD-10-CM | POA: Diagnosis not present

## 2019-07-27 DIAGNOSIS — N96 Recurrent pregnancy loss: Secondary | ICD-10-CM | POA: Diagnosis not present

## 2019-07-27 DIAGNOSIS — Z3200 Encounter for pregnancy test, result unknown: Secondary | ICD-10-CM | POA: Diagnosis not present

## 2019-07-27 DIAGNOSIS — E288 Other ovarian dysfunction: Secondary | ICD-10-CM | POA: Diagnosis not present

## 2019-07-29 ENCOUNTER — Ambulatory Visit: Payer: BC Managed Care – PPO | Attending: Internal Medicine

## 2019-07-29 DIAGNOSIS — Z3201 Encounter for pregnancy test, result positive: Secondary | ICD-10-CM | POA: Diagnosis not present

## 2019-07-29 DIAGNOSIS — Z20822 Contact with and (suspected) exposure to covid-19: Secondary | ICD-10-CM | POA: Insufficient documentation

## 2019-07-29 DIAGNOSIS — Z3200 Encounter for pregnancy test, result unknown: Secondary | ICD-10-CM | POA: Diagnosis not present

## 2019-07-30 LAB — NOVEL CORONAVIRUS, NAA: SARS-CoV-2, NAA: NOT DETECTED

## 2019-08-03 DIAGNOSIS — Z3200 Encounter for pregnancy test, result unknown: Secondary | ICD-10-CM | POA: Diagnosis not present

## 2019-08-04 DIAGNOSIS — Z3200 Encounter for pregnancy test, result unknown: Secondary | ICD-10-CM | POA: Diagnosis not present

## 2019-08-04 DIAGNOSIS — Z3201 Encounter for pregnancy test, result positive: Secondary | ICD-10-CM | POA: Diagnosis not present

## 2019-08-05 ENCOUNTER — Ambulatory Visit: Payer: Self-pay | Attending: Internal Medicine

## 2019-08-05 DIAGNOSIS — Z20822 Contact with and (suspected) exposure to covid-19: Secondary | ICD-10-CM

## 2019-08-06 LAB — NOVEL CORONAVIRUS, NAA: SARS-CoV-2, NAA: NOT DETECTED

## 2019-08-11 DIAGNOSIS — O0900 Supervision of pregnancy with history of infertility, unspecified trimester: Secondary | ICD-10-CM | POA: Diagnosis not present

## 2019-08-24 DIAGNOSIS — N96 Recurrent pregnancy loss: Secondary | ICD-10-CM | POA: Diagnosis not present

## 2019-08-25 ENCOUNTER — Ambulatory Visit: Payer: Self-pay

## 2019-08-25 ENCOUNTER — Ambulatory Visit: Payer: Self-pay | Attending: Internal Medicine

## 2019-08-25 DIAGNOSIS — Z23 Encounter for immunization: Secondary | ICD-10-CM | POA: Insufficient documentation

## 2019-08-25 NOTE — Progress Notes (Addendum)
   Covid-19 Vaccination Clinic  Name:  Kelly Navarro    MRN: [id number] DOB: [birth year]  08/25/2019  [name] was observed post Covid-19 immunization for 30 minutes without incidence. She was provided with Vaccine Information Sheet and instruction to access the V-Safe system.   [name] was instructed to call 911 with any severe reactions post vaccine: Marland Kitchen Difficulty breathing  . Swelling of your face and throat  . A fast heartbeat  . A bad rash all over your body  . Dizziness and weakness    Immunizations Administered    Name Date Dose VIS Date Route   Moderna COVID-19 Vaccine 08/25/2019  3:22 PM 0.5 mL 05/31/2019 Intramuscular   Manufacturer: Moderna   Lot: [id number]   NDC: 66815-947-07

## 2019-09-05 DIAGNOSIS — O0900 Supervision of pregnancy with history of infertility, unspecified trimester: Secondary | ICD-10-CM | POA: Diagnosis not present

## 2019-09-16 ENCOUNTER — Ambulatory Visit: Payer: Self-pay | Admitting: Family Medicine

## 2019-09-16 DIAGNOSIS — Z3A12 12 weeks gestation of pregnancy: Secondary | ICD-10-CM | POA: Diagnosis not present

## 2019-09-16 DIAGNOSIS — Z3689 Encounter for other specified antenatal screening: Secondary | ICD-10-CM | POA: Diagnosis not present

## 2019-09-16 DIAGNOSIS — O09291 Supervision of pregnancy with other poor reproductive or obstetric history, first trimester: Secondary | ICD-10-CM | POA: Diagnosis not present

## 2019-09-20 ENCOUNTER — Ambulatory Visit: Payer: Self-pay | Attending: Family

## 2019-09-20 DIAGNOSIS — O09291 Supervision of pregnancy with other poor reproductive or obstetric history, first trimester: Secondary | ICD-10-CM | POA: Diagnosis not present

## 2019-09-20 DIAGNOSIS — Z3A12 12 weeks gestation of pregnancy: Secondary | ICD-10-CM | POA: Diagnosis not present

## 2019-09-20 DIAGNOSIS — Z3682 Encounter for antenatal screening for nuchal translucency: Secondary | ICD-10-CM | POA: Diagnosis not present

## 2019-09-20 DIAGNOSIS — Z23 Encounter for immunization: Secondary | ICD-10-CM

## 2019-09-20 NOTE — Progress Notes (Signed)
   Covid-19 Vaccination Clinic  Name:  Saretta Dahlem    MRN: [id number] DOB: May 06, 1986  09/20/2019  Ms. Bessey was observed post Covid-19 immunization for 15 minutes without incident. She was provided with Vaccine Information Sheet and instruction to access the V-Safe system.   Ms. Soden was instructed to call 911 with any severe reactions post vaccine: Marland Kitchen Difficulty breathing  . Swelling of face and throat  . A fast heartbeat  . A bad rash all over body  . Dizziness and weakness   Immunizations Administered    Name Date Dose VIS Date Route   Moderna COVID-19 Vaccine 09/20/2019  3:31 PM 0.5 mL 05/31/2019 Intramuscular   Manufacturer: Moderna   Lot: [id number]   NDC: 14643-142-76

## 2019-09-30 ENCOUNTER — Ambulatory Visit: Payer: BC Managed Care – PPO | Admitting: Family Medicine

## 2019-10-17 DIAGNOSIS — Z3A15 15 weeks gestation of pregnancy: Secondary | ICD-10-CM | POA: Diagnosis not present

## 2019-10-17 DIAGNOSIS — Z361 Encounter for antenatal screening for raised alphafetoprotein level: Secondary | ICD-10-CM | POA: Diagnosis not present

## 2019-10-17 DIAGNOSIS — O09292 Supervision of pregnancy with other poor reproductive or obstetric history, second trimester: Secondary | ICD-10-CM | POA: Diagnosis not present

## 2019-11-09 DIAGNOSIS — Z3686 Encounter for antenatal screening for cervical length: Secondary | ICD-10-CM | POA: Diagnosis not present

## 2019-11-09 DIAGNOSIS — Z3A19 19 weeks gestation of pregnancy: Secondary | ICD-10-CM | POA: Diagnosis not present

## 2019-11-09 DIAGNOSIS — O09292 Supervision of pregnancy with other poor reproductive or obstetric history, second trimester: Secondary | ICD-10-CM | POA: Diagnosis not present

## 2019-11-09 DIAGNOSIS — O26812 Pregnancy related exhaustion and fatigue, second trimester: Secondary | ICD-10-CM | POA: Diagnosis not present

## 2019-11-09 DIAGNOSIS — O09512 Supervision of elderly primigravida, second trimester: Secondary | ICD-10-CM | POA: Diagnosis not present

## 2019-12-05 DIAGNOSIS — R1084 Generalized abdominal pain: Secondary | ICD-10-CM | POA: Diagnosis not present

## 2019-12-05 DIAGNOSIS — O26812 Pregnancy related exhaustion and fatigue, second trimester: Secondary | ICD-10-CM | POA: Diagnosis not present

## 2019-12-05 DIAGNOSIS — Z3A22 22 weeks gestation of pregnancy: Secondary | ICD-10-CM | POA: Diagnosis not present

## 2019-12-28 DIAGNOSIS — O34219 Maternal care for unspecified type scar from previous cesarean delivery: Secondary | ICD-10-CM | POA: Diagnosis not present

## 2019-12-28 DIAGNOSIS — Z3A26 26 weeks gestation of pregnancy: Secondary | ICD-10-CM | POA: Diagnosis not present

## 2020-01-18 ENCOUNTER — Encounter: Payer: Self-pay | Admitting: Family Medicine

## 2020-01-18 ENCOUNTER — Other Ambulatory Visit: Payer: Self-pay

## 2020-01-18 ENCOUNTER — Ambulatory Visit (INDEPENDENT_AMBULATORY_CARE_PROVIDER_SITE_OTHER): Payer: BC Managed Care – PPO | Admitting: Family Medicine

## 2020-01-18 VITALS — BP 98/70 | HR 99 | Temp 98.0°F | Ht 68.0 in | Wt 196.9 lb

## 2020-01-18 DIAGNOSIS — K219 Gastro-esophageal reflux disease without esophagitis: Secondary | ICD-10-CM

## 2020-01-18 DIAGNOSIS — E7522 Gaucher disease: Secondary | ICD-10-CM

## 2020-01-18 DIAGNOSIS — Z3689 Encounter for other specified antenatal screening: Secondary | ICD-10-CM | POA: Diagnosis not present

## 2020-01-18 DIAGNOSIS — B078 Other viral warts: Secondary | ICD-10-CM

## 2020-01-18 DIAGNOSIS — O09293 Supervision of pregnancy with other poor reproductive or obstetric history, third trimester: Secondary | ICD-10-CM | POA: Diagnosis not present

## 2020-01-18 DIAGNOSIS — L309 Dermatitis, unspecified: Secondary | ICD-10-CM

## 2020-01-18 DIAGNOSIS — Z3A29 29 weeks gestation of pregnancy: Secondary | ICD-10-CM | POA: Diagnosis not present

## 2020-01-18 DIAGNOSIS — O09513 Supervision of elderly primigravida, third trimester: Secondary | ICD-10-CM | POA: Diagnosis not present

## 2020-01-18 MED ORDER — POLYETHYLENE GLYCOL 3350 17 GM/SCOOP PO POWD: 17.0000 g | Freq: Two times a day (BID) | ORAL | 1 refills | Status: DC | PRN

## 2020-01-18 MED ORDER — EPINEPHRINE 0.3 MG/0.3ML IJ SOAJ: 0.3000 mg | INTRAMUSCULAR | 2 refills | Status: DC | PRN

## 2020-01-18 NOTE — Progress Notes (Signed)
Kelly Navarro DOB: 1985/11/11 Encounter date: 01/18/2020  This is a 34 y.o. female who presents to establish care. Chief Complaint  Patient presents with  . Establish Care    History of present illness: She is seeing mid-wives at [facility]. Had miscarriage 06/2018 and then 01/2019 and so was [facility]. She is [redacted]weeks pregnant now. She had septate removed from uterus and had polyp removed. Also had extensive bloodwork and diagnosed with PAI.   Gaucher's disease: diagnosed on screening that she completed just preventatively due to ancestry. Interested in following with hematology after delivery.   Has 34 year old at home and thus feeling a little tired. Heartburn has been rough.   Used to get bad hormonal headaches. Hasn't been on birth control since prior to 34 year old was born. Haven't been as bad in last couple of years.    Was having issues with vomiting, constipation, and had evaluation with GI and was dx with IBS. Has had a harder time during pregnancy. Seems to do ok when not pregnant.  Hips were found to be out of alignment. When dx with gauchers this was evaluated with MRI and found that she was not too much out of alignment. Since having first child has had some chronic lower right back pain. Little pinching with walking/trying to run. Has card for pilates/PT instructor. After having 34 year old just felt like she couldn't get back into good routine.   She is speech [place] - moving towards being independent.   Eczema is still an issue - deals with it off and on, worse in Winter. Follows with Dr. Ubaldo Glassing.   Past Medical History:  Diagnosis Date  . Bee sting allergy   . 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 Eczema   . Gaucher disease, type I ([place])    noted on Counsyl genetic testing 05/2015  . GERD (gastroesophageal  reflux disease)   . Headache(784.0)    menstrual   . IBS (irritable bowel syndrome)   . Incomplete miscarriage 02/22/2019  . Scoliosis    very mild   Past Surgical History:  Procedure Laterality Date  . CESAREAN SECTION N/A 10/21/2016   Procedure: CESAREAN SECTION;  Surgeon: Linda Hedges, DO;  Location: [place];  Service: Obstetrics;  Laterality: N/A;  . DILATION AND EVACUATION N/A 02/22/2019   Procedure: DILATATION AND EVACUATION;  Surgeon: Dian Queen, MD;  Location: [place];  Service: Gynecology;  Laterality: N/A;  . GUM SURGERY    . OTHER SURGICAL HISTORY  2020   septate uterus, polyp  . WISDOM TOOTH EXTRACTION     Allergies  Allergen Reactions  . Amoxicillin Hives    Has patient had a PCN reaction causing immediate rash, facial/tongue/throat swelling, SOB or lightheadedness with hypotension: Yes Has patient had a PCN reaction causing severe rash involving mucus membranes or skin necrosis: No Has patient had a PCN reaction that required hospitalization No Has patient had a PCN reaction occurring within the last 10 years: No If all of the above answers are "NO", then may proceed with Cephalosporin use.  . Bee Venom Other (See Comments)    Tongue swelling  . Macrodantin [Nitrofurantoin Macrocrystal] Nausea And Vomiting  . Penicillins Hives    Has patient had a PCN reaction causing immediate rash, facial/tongue/throat swelling, SOB or lightheadedness with hypotension: Yes Has patient had a PCN reaction causing severe rash  involving mucus membranes or skin necrosis: No Has patient had a PCN reaction that required hospitalization No Has patient had a PCN reaction occurring within the last 10 years: No If all of the above answers are "NO", then may proceed with Cephalosporin use.  Marland Kitchen Pineapple     Itchy throat  . Sulfa Antibiotics Hives   Current Meds  Medication Sig  . Enoxaparin Sodium (LOVENOX IJ) Inject as directed.  . famotidine (PEPCID) 20 MG tablet Take 20 mg  by mouth 2 (two) times daily.  . Prenatal Vit-Fe Fumarate-FA (PRENATAL MULTIVITAMIN) TABS tablet Take 1 tablet by mouth daily at 12 noon.   Social History   Tobacco Use  . Smoking status: Never Smoker  . Smokeless tobacco: Never Used  Substance Use Topics  . Alcohol use: Yes    Alcohol/week: 1.0 standard drink    Types: 1 Standard drinks or equivalent per week    Comment: none with pregnancy   Family History  Problem Relation Age of Onset  . Hypertension Mother   . Non-Hodgkin's lymphoma Mother   . Healthy Father   . Asthma Sister   . Eczema Sister   . Diabetes Maternal Grandfather   . Hypertension Maternal Grandfather   . Heart disease Maternal Grandfather   . Breast cancer Paternal Grandmother        Age 30  . Cancer Paternal Grandfather        multiple myeloma     Review of Systems  Objective:  BP 98/70 (BP Location: Left Arm, Patient Position: Sitting, Cuff Size: Large)   Pulse 99   Temp 98 F (36.7 C) (Oral)   Ht '5\' 8"'$  (1.727 m)   Wt 196 lb 14.4 oz (89.3 kg)   BMI 29.94 kg/m   Weight: 196 lb 14.4 oz (89.3 kg)   BP Readings from Last 3 Encounters:  01/18/20 98/70  02/22/19 114/78  02/20/19 (!) 94/55   Wt Readings from Last 3 Encounters:  01/18/20 196 lb 14.4 oz (89.3 kg)  02/22/19 182 lb 12.8 oz (82.9 kg)  02/18/19 186 lb (84.4 kg)    Physical Exam Constitutional:      General: She is not in acute distress.    Appearance: She is well-developed.  Cardiovascular:     Rate and Rhythm: Normal rate and regular rhythm.     Heart sounds: Normal heart sounds. No murmur heard.  No friction rub.  Pulmonary:     Effort: Pulmonary effort is normal. No respiratory distress.     Breath sounds: Normal breath sounds. No wheezing or rales.  Musculoskeletal:     Right lower leg: No edema.     Left lower leg: No edema.  Skin:    Comments: Right dorsal foot 0.3cm fleshy papule  Neurological:     Mental Status: She is alert and oriented to person, place, and  time.  Psychiatric:        Behavior: Behavior normal.    Cryotherapy Procedure Note  Pre-operative Diagnosis: wart  Post-operative Diagnosis: same  Locations: right dorsal foot; 0.3cm  Procedure Details  Patient informed of the risks, including bleeding and infection, and benefits of the  procedure and Verbal informed consent obtained. The lesion and surrounding area was cleansed with alcohol and lesion was shaved superficially using a dermablade.  Three cycles of liquid nitrogen applied to the area with 1-62m perimeter with pause between cycles. Patient tolerated procedure well.  Complications: none.  Plan: 1. Discussed that there may be blister formation. Keep  wound clean, dry. OK to apply antibiotic ointment if needed.  2. Warning signs of infection were reviewed.   3. Return in 2 weeks for re-treatment if lesion persists.    Assessment/Plan:  1. Gastroesophageal reflux disease, unspecified whether esophagitis present Continue with pepcid; avoid reflux triggers. Smaller meals.  2. Gaucher disease, type I ([place]) Has been seen at [facility]; they do have a specialty clinic for this. She plans to follow up with them post partum and we will need to re-discuss screening related to this diagnosis (bone screening, routine bloodwork, etc)  3. Eczema, unspecified type Stable. Does follow with dermatology.   4. Wart - treated in office today. See above  Return in about 3 months (around 04/19/2020) for physical exam. Will discuss other chronic conditions and plan for follow up post partum. Micheline Rough, MD Time spent with patient, charting, chart review 45 min.

## 2020-02-02 DIAGNOSIS — O09293 Supervision of pregnancy with other poor reproductive or obstetric history, third trimester: Secondary | ICD-10-CM | POA: Diagnosis not present

## 2020-02-02 DIAGNOSIS — Z3A31 31 weeks gestation of pregnancy: Secondary | ICD-10-CM | POA: Diagnosis not present

## 2020-02-02 DIAGNOSIS — O09513 Supervision of elderly primigravida, third trimester: Secondary | ICD-10-CM | POA: Diagnosis not present

## 2020-02-13 DIAGNOSIS — Z3A32 32 weeks gestation of pregnancy: Secondary | ICD-10-CM | POA: Diagnosis not present

## 2020-02-13 DIAGNOSIS — O09513 Supervision of elderly primigravida, third trimester: Secondary | ICD-10-CM | POA: Diagnosis not present

## 2020-02-13 DIAGNOSIS — O09293 Supervision of pregnancy with other poor reproductive or obstetric history, third trimester: Secondary | ICD-10-CM | POA: Diagnosis not present

## 2020-02-27 DIAGNOSIS — Z20828 Contact with and (suspected) exposure to other viral communicable diseases: Secondary | ICD-10-CM | POA: Diagnosis not present

## 2020-02-27 DIAGNOSIS — O09293 Supervision of pregnancy with other poor reproductive or obstetric history, third trimester: Secondary | ICD-10-CM | POA: Diagnosis not present

## 2020-02-27 DIAGNOSIS — O09513 Supervision of elderly primigravida, third trimester: Secondary | ICD-10-CM | POA: Diagnosis not present

## 2020-02-27 DIAGNOSIS — R05 Cough: Secondary | ICD-10-CM | POA: Diagnosis not present

## 2020-02-27 DIAGNOSIS — Z3A34 34 weeks gestation of pregnancy: Secondary | ICD-10-CM | POA: Diagnosis not present

## 2020-02-28 DIAGNOSIS — Z20828 Contact with and (suspected) exposure to other viral communicable diseases: Secondary | ICD-10-CM | POA: Diagnosis not present

## 2020-02-28 DIAGNOSIS — R05 Cough: Secondary | ICD-10-CM | POA: Diagnosis not present

## 2020-03-01 ENCOUNTER — Other Ambulatory Visit: Payer: Self-pay

## 2020-03-09 DIAGNOSIS — Z3A36 36 weeks gestation of pregnancy: Secondary | ICD-10-CM | POA: Diagnosis not present

## 2020-03-09 DIAGNOSIS — O09293 Supervision of pregnancy with other poor reproductive or obstetric history, third trimester: Secondary | ICD-10-CM | POA: Diagnosis not present

## 2020-03-09 DIAGNOSIS — Z3685 Encounter for antenatal screening for Streptococcus B: Secondary | ICD-10-CM | POA: Diagnosis not present

## 2020-03-09 DIAGNOSIS — O09513 Supervision of elderly primigravida, third trimester: Secondary | ICD-10-CM | POA: Diagnosis not present

## 2020-03-13 MED FILL — HEPARIN SOD 10,000 UNIT/ML: 10000 | 13 days supply | Qty: 25 | Fill #0

## 2020-03-14 DIAGNOSIS — Z3A37 37 weeks gestation of pregnancy: Secondary | ICD-10-CM | POA: Diagnosis not present

## 2020-03-14 DIAGNOSIS — O09513 Supervision of elderly primigravida, third trimester: Secondary | ICD-10-CM | POA: Diagnosis not present

## 2020-03-14 DIAGNOSIS — O88213 Thromboembolism in pregnancy, third trimester: Secondary | ICD-10-CM | POA: Diagnosis not present

## 2020-03-15 ENCOUNTER — Encounter: Payer: Self-pay | Admitting: Family Medicine

## 2020-03-19 DIAGNOSIS — O88213 Thromboembolism in pregnancy, third trimester: Secondary | ICD-10-CM | POA: Diagnosis not present

## 2020-03-19 DIAGNOSIS — O09513 Supervision of elderly primigravida, third trimester: Secondary | ICD-10-CM | POA: Diagnosis not present

## 2020-03-19 DIAGNOSIS — Z3A37 37 weeks gestation of pregnancy: Secondary | ICD-10-CM | POA: Diagnosis not present

## 2020-03-21 DIAGNOSIS — Z111 Encounter for screening for respiratory tuberculosis: Secondary | ICD-10-CM | POA: Diagnosis not present

## 2020-03-22 ENCOUNTER — Other Ambulatory Visit: Payer: Self-pay

## 2020-03-22 ENCOUNTER — Encounter (HOSPITAL_COMMUNITY)
Admission: AD | Disposition: A | Discharge status: Home / Self Care | Payer: Self-pay | Source: Home / Self Care | Attending: Obstetrics

## 2020-03-22 ENCOUNTER — Inpatient Hospital Stay (HOSPITAL_COMMUNITY)
Admission: AD | Admit: 2020-03-22 | Discharge: 2020-03-25 | DRG: 786 | Disposition: A | Discharge status: Home / Self Care | Payer: BC Managed Care – PPO | Attending: Obstetrics | Admitting: Obstetrics

## 2020-03-22 ENCOUNTER — Encounter (HOSPITAL_COMMUNITY): Payer: Self-pay | Admitting: Obstetrics

## 2020-03-22 ENCOUNTER — Inpatient Hospital Stay (HOSPITAL_COMMUNITY): Payer: BC Managed Care – PPO | Admitting: Anesthesiology

## 2020-03-22 DIAGNOSIS — O99284 Endocrine, nutritional and metabolic diseases complicating childbirth: Secondary | ICD-10-CM | POA: Diagnosis not present

## 2020-03-22 DIAGNOSIS — Z8349 Family history of other endocrine, nutritional and metabolic diseases: Secondary | ICD-10-CM | POA: Diagnosis not present

## 2020-03-22 DIAGNOSIS — O88213 Thromboembolism in pregnancy, third trimester: Secondary | ICD-10-CM | POA: Diagnosis not present

## 2020-03-22 DIAGNOSIS — O9912 Other diseases of the blood and blood-forming organs and certain disorders involving the immune mechanism complicating childbirth: Secondary | ICD-10-CM | POA: Diagnosis present

## 2020-03-22 DIAGNOSIS — Z23 Encounter for immunization: Secondary | ICD-10-CM | POA: Diagnosis not present

## 2020-03-22 DIAGNOSIS — O09513 Supervision of elderly primigravida, third trimester: Secondary | ICD-10-CM | POA: Diagnosis not present

## 2020-03-22 DIAGNOSIS — O321XX0 Maternal care for breech presentation, not applicable or unspecified: Secondary | ICD-10-CM

## 2020-03-22 DIAGNOSIS — E7522 Gaucher disease: Secondary | ICD-10-CM | POA: Diagnosis not present

## 2020-03-22 DIAGNOSIS — O34211 Maternal care for low transverse scar from previous cesarean delivery: Principal | ICD-10-CM | POA: Diagnosis present

## 2020-03-22 DIAGNOSIS — Z3A38 38 weeks gestation of pregnancy: Secondary | ICD-10-CM | POA: Diagnosis not present

## 2020-03-22 DIAGNOSIS — D6859 Other primary thrombophilia: Secondary | ICD-10-CM

## 2020-03-22 DIAGNOSIS — Z98891 History of uterine scar from previous surgery: Secondary | ICD-10-CM

## 2020-03-22 DIAGNOSIS — Z880 Allergy status to penicillin: Secondary | ICD-10-CM | POA: Diagnosis not present

## 2020-03-22 DIAGNOSIS — E669 Obesity, unspecified: Secondary | ICD-10-CM | POA: Diagnosis present

## 2020-03-22 DIAGNOSIS — Z20822 Contact with and (suspected) exposure to covid-19: Secondary | ICD-10-CM | POA: Diagnosis not present

## 2020-03-22 DIAGNOSIS — O99119 Other diseases of the blood and blood-forming organs and certain disorders involving the immune mechanism complicating pregnancy, unspecified trimester: Secondary | ICD-10-CM

## 2020-03-22 DIAGNOSIS — O4593 Premature separation of placenta, unspecified, third trimester: Secondary | ICD-10-CM

## 2020-03-22 DIAGNOSIS — O99214 Obesity complicating childbirth: Secondary | ICD-10-CM | POA: Diagnosis not present

## 2020-03-22 DIAGNOSIS — O34219 Maternal care for unspecified type scar from previous cesarean delivery: Secondary | ICD-10-CM

## 2020-03-22 HISTORY — DX: Maternal care for breech presentation, not applicable or unspecified: O32.1XX0

## 2020-03-22 LAB — PROTIME-INR
INR: 1 (ref 0.8–1.2)
Prothrombin Time: 12.5 seconds (ref 11.4–15.2)

## 2020-03-22 LAB — PREPARE RBC (CROSSMATCH)

## 2020-03-22 LAB — FIBRINOGEN: Fibrinogen: 595 mg/dL — ABNORMAL HIGH (ref 210–475)

## 2020-03-22 LAB — CBC
HCT: 38.6 % (ref 36.0–46.0)
Hemoglobin: 13 g/dL (ref 12.0–15.0)
MCH: 31.1 pg (ref 26.0–34.0)
MCHC: 33.7 g/dL (ref 30.0–36.0)
MCV: 92.3 fL (ref 80.0–100.0)
Platelets: 208 10*3/uL (ref 150–400)
RBC: 4.18 MIL/uL (ref 3.87–5.11)
RDW: 13.9 % (ref 11.5–15.5)
WBC: 9.7 10*3/uL (ref 4.0–10.5)
nRBC: 0 % (ref 0.0–0.2)

## 2020-03-22 LAB — RESPIRATORY PANEL BY RT PCR (FLU A&B, COVID)
Influenza A by PCR: NEGATIVE
Influenza B by PCR: NEGATIVE
SARS Coronavirus 2 by RT PCR: NEGATIVE

## 2020-03-22 LAB — APTT: aPTT: 33 seconds (ref 24–36)

## 2020-03-22 SURGERY — Surgical Case
Anesthesia: Spinal

## 2020-03-22 MED ORDER — SODIUM CHLORIDE 0.9 % IV SOLN
INTRAVENOUS | Status: AC
  Filled 2020-03-22: qty 2

## 2020-03-22 MED ORDER — KETOROLAC TROMETHAMINE 30 MG/ML IJ SOLN: 30.0000 mg | Freq: Once | INTRAMUSCULAR | Status: AC | PRN

## 2020-03-22 MED ORDER — PHENYLEPHRINE HCL-NACL 20-0.9 MG/250ML-% IV SOLN
INTRAVENOUS | Status: AC
  Filled 2020-03-22: qty 250

## 2020-03-22 MED ORDER — ONDANSETRON HCL 4 MG/2ML IJ SOLN
INTRAMUSCULAR | Status: AC
  Filled 2020-03-22: qty 2

## 2020-03-22 MED ORDER — ACETAMINOPHEN 10 MG/ML IV SOLN
1000.0000 mg | Freq: Once | INTRAVENOUS | Status: AC
  Administered 2020-03-22: 1000 mg via INTRAVENOUS

## 2020-03-22 MED ORDER — HYDROMORPHONE HCL 1 MG/ML IJ SOLN: 0.2500 mg | INTRAMUSCULAR | Status: DC | PRN

## 2020-03-22 MED ORDER — LACTATED RINGERS IV SOLN: INTRAVENOUS | Status: DC | PRN

## 2020-03-22 MED ORDER — BUPIVACAINE IN DEXTROSE 0.75-8.25 % IT SOLN
INTRATHECAL | Status: DC | PRN
  Administered 2020-03-22: 1.8 mL via INTRATHECAL

## 2020-03-22 MED ORDER — EPHEDRINE 5 MG/ML INJ
INTRAVENOUS | Status: AC
  Filled 2020-03-22: qty 10

## 2020-03-22 MED ORDER — OXYTOCIN-SODIUM CHLORIDE 30-0.9 UT/500ML-% IV SOLN
INTRAVENOUS | Status: DC | PRN
  Administered 2020-03-22: 30 [IU] via INTRAVENOUS

## 2020-03-22 MED ORDER — SODIUM CHLORIDE 0.9 % IV SOLN: INTRAVENOUS | Status: DC | PRN

## 2020-03-22 MED ORDER — NALBUPHINE HCL 10 MG/ML IJ SOLN: 5.0000 mg | INTRAMUSCULAR | Status: DC | PRN

## 2020-03-22 MED ORDER — KETOROLAC TROMETHAMINE 30 MG/ML IJ SOLN
30.0000 mg | Freq: Four times a day (QID) | INTRAMUSCULAR | Status: AC | PRN
  Administered 2020-03-23 (×2): 30 mg via INTRAVENOUS
  Filled 2020-03-22: qty 1

## 2020-03-22 MED ORDER — PROMETHAZINE HCL 25 MG/ML IJ SOLN: 6.2500 mg | INTRAMUSCULAR | Status: DC | PRN

## 2020-03-22 MED ORDER — NALOXONE HCL 0.4 MG/ML IJ SOLN: 0.4000 mg | INTRAMUSCULAR | Status: DC | PRN

## 2020-03-22 MED ORDER — PHENYLEPHRINE HCL (PRESSORS) 10 MG/ML IV SOLN
INTRAVENOUS | Status: DC | PRN
  Administered 2020-03-22: 120 ug via INTRAVENOUS

## 2020-03-22 MED ORDER — SODIUM CHLORIDE 0.9% IV SOLUTION: Freq: Once | INTRAVENOUS | Status: DC

## 2020-03-22 MED ORDER — FENTANYL CITRATE (PF) 100 MCG/2ML IJ SOLN
INTRAMUSCULAR | Status: DC | PRN
Start: 2020-03-22 — End: 2020-03-22
  Administered 2020-03-22: 15 ug via INTRATHECAL

## 2020-03-22 MED ORDER — KETOROLAC TROMETHAMINE 30 MG/ML IJ SOLN
INTRAMUSCULAR | Status: AC
  Filled 2020-03-22: qty 1

## 2020-03-22 MED ORDER — EPHEDRINE SULFATE 50 MG/ML IJ SOLN
INTRAMUSCULAR | Status: DC | PRN
  Administered 2020-03-22: 5 mg via INTRAVENOUS

## 2020-03-22 MED ORDER — FENTANYL CITRATE (PF) 100 MCG/2ML IJ SOLN
INTRAMUSCULAR | Status: AC
  Filled 2020-03-22: qty 2

## 2020-03-22 MED ORDER — SODIUM CHLORIDE 0.9 % IV SOLN: 2.0000 g | INTRAVENOUS | Status: DC

## 2020-03-22 MED ORDER — DIPHENHYDRAMINE HCL 50 MG/ML IJ SOLN: 12.5000 mg | INTRAMUSCULAR | Status: DC | PRN

## 2020-03-22 MED ORDER — MORPHINE SULFATE (PF) 0.5 MG/ML IJ SOLN
INTRAMUSCULAR | Status: AC
  Filled 2020-03-22: qty 10

## 2020-03-22 MED ORDER — MORPHINE SULFATE (PF) 0.5 MG/ML IJ SOLN
INTRAMUSCULAR | Status: DC | PRN
Start: 2020-03-22 — End: 2020-03-22
  Administered 2020-03-22: .15 ug via INTRATHECAL

## 2020-03-22 MED ORDER — ACETAMINOPHEN 10 MG/ML IV SOLN
INTRAVENOUS | Status: AC
  Filled 2020-03-22: qty 100

## 2020-03-22 MED ORDER — NALBUPHINE HCL 10 MG/ML IJ SOLN: 5.0000 mg | Freq: Once | INTRAMUSCULAR | Status: DC | PRN

## 2020-03-22 MED ORDER — ONDANSETRON HCL 4 MG/2ML IJ SOLN: 4.0000 mg | Freq: Three times a day (TID) | INTRAMUSCULAR | Status: DC | PRN

## 2020-03-22 MED ORDER — SODIUM CHLORIDE 0.9% FLUSH: 3.0000 mL | INTRAVENOUS | Status: DC | PRN

## 2020-03-22 MED ORDER — NALOXONE HCL 4 MG/10ML IJ SOLN
1.0000 ug/kg/h | INTRAVENOUS | Status: DC | PRN
  Filled 2020-03-22: qty 5

## 2020-03-22 MED ORDER — POVIDONE-IODINE 10 % EX SWAB: 2.0000 "application " | Freq: Once | CUTANEOUS | Status: DC

## 2020-03-22 MED ORDER — ONDANSETRON HCL 4 MG/2ML IJ SOLN
INTRAMUSCULAR | Status: DC | PRN
  Administered 2020-03-22: 4 mg via INTRAVENOUS

## 2020-03-22 MED ORDER — MEPERIDINE HCL 25 MG/ML IJ SOLN: 6.2500 mg | INTRAMUSCULAR | Status: DC | PRN

## 2020-03-22 MED ORDER — SODIUM CHLORIDE 0.9 % IV SOLN: INTRAVENOUS | Status: DC

## 2020-03-22 MED ORDER — ACETAMINOPHEN 500 MG PO TABS
1000.0000 mg | ORAL_TABLET | Freq: Four times a day (QID) | ORAL | Status: AC
  Administered 2020-03-23 (×3): 1000 mg via ORAL
  Filled 2020-03-22 (×3): qty 2

## 2020-03-22 MED ORDER — PHENYLEPHRINE HCL-NACL 20-0.9 MG/250ML-% IV SOLN
INTRAVENOUS | Status: DC | PRN
  Administered 2020-03-22: 60 ug/min via INTRAVENOUS

## 2020-03-22 MED ORDER — OXYCODONE HCL 5 MG PO TABS: 5.0000 mg | ORAL_TABLET | Freq: Once | ORAL | Status: DC | PRN

## 2020-03-22 MED ORDER — DIPHENHYDRAMINE HCL 25 MG PO CAPS: 25.0000 mg | ORAL_CAPSULE | ORAL | Status: DC | PRN

## 2020-03-22 MED ORDER — SODIUM CHLORIDE 0.9 % IV SOLN
INTRAVENOUS | Status: DC | PRN
  Administered 2020-03-22: 2 g via INTRAVENOUS

## 2020-03-22 MED ORDER — PHENYLEPHRINE 40 MCG/ML (10ML) SYRINGE FOR IV PUSH (FOR BLOOD PRESSURE SUPPORT)
PREFILLED_SYRINGE | INTRAVENOUS | Status: AC
  Filled 2020-03-22: qty 10

## 2020-03-22 MED ORDER — KETOROLAC TROMETHAMINE 30 MG/ML IJ SOLN: 30.0000 mg | Freq: Four times a day (QID) | INTRAMUSCULAR | Status: AC | PRN

## 2020-03-22 MED ORDER — OXYCODONE HCL 5 MG/5ML PO SOLN: 5.0000 mg | Freq: Once | ORAL | Status: DC | PRN

## 2020-03-22 MED ORDER — SCOPOLAMINE 1 MG/3DAYS TD PT72
1.0000 | MEDICATED_PATCH | Freq: Once | TRANSDERMAL | Status: DC
  Administered 2020-03-23: 1.5 mg via TRANSDERMAL
  Filled 2020-03-22: qty 1

## 2020-03-22 SURGICAL SUPPLY — 37 items
APL SKNCLS STERI-STRIP NONHPOA (GAUZE/BANDAGES/DRESSINGS) ×1
BENZOIN TINCTURE PRP APPL 2/3 (GAUZE/BANDAGES/DRESSINGS) ×1 IMPLANT
CHLORAPREP W/TINT 26ML (MISCELLANEOUS) ×2 IMPLANT
CLAMP CORD UMBIL (MISCELLANEOUS) IMPLANT
CLOSURE STERI STRIP 1/2 X4 (GAUZE/BANDAGES/DRESSINGS) ×1 IMPLANT
CLOTH BEACON ORANGE TIMEOUT ST (SAFETY) ×2 IMPLANT
DRSG OPSITE POSTOP 4X10 (GAUZE/BANDAGES/DRESSINGS) ×2 IMPLANT
ELECT REM PT RETURN 9FT ADLT (ELECTROSURGICAL) ×2
ELECTRODE REM PT RTRN 9FT ADLT (ELECTROSURGICAL) ×1 IMPLANT
EXTRACTOR VACUUM M CUP 4 TUBE (SUCTIONS) IMPLANT
GLOVE BIO SURGEON STRL SZ 6.5 (GLOVE) ×2 IMPLANT
GLOVE BIOGEL PI IND STRL 7.0 (GLOVE) ×2 IMPLANT
GLOVE BIOGEL PI INDICATOR 7.0 (GLOVE) ×2
GOWN STRL REUS W/TWL LRG LVL3 (GOWN DISPOSABLE) ×4 IMPLANT
KIT ABG SYR 3ML LUER SLIP (SYRINGE) IMPLANT
NDL HYPO 25X5/8 SAFETYGLIDE (NEEDLE) IMPLANT
NEEDLE HYPO 22GX1.5 SAFETY (NEEDLE) IMPLANT
NEEDLE HYPO 25X5/8 SAFETYGLIDE (NEEDLE) IMPLANT
NS IRRIG 1000ML POUR BTL (IV SOLUTION) ×2 IMPLANT
PACK C SECTION WH (CUSTOM PROCEDURE TRAY) ×2 IMPLANT
PAD OB MATERNITY 4.3X12.25 (PERSONAL CARE ITEMS) ×2 IMPLANT
PENCIL SMOKE EVAC W/HOLSTER (ELECTROSURGICAL) ×2 IMPLANT
STRIP CLOSURE SKIN 1/2X4 (GAUZE/BANDAGES/DRESSINGS) IMPLANT
SUT MON AB 4-0 PS1 27 (SUTURE) ×3 IMPLANT
SUT PLAIN 0 NONE (SUTURE) IMPLANT
SUT PLAIN 2 0 (SUTURE) ×2
SUT PLAIN 2 0 XLH (SUTURE) IMPLANT
SUT PLAIN ABS 2-0 CT1 27XMFL (SUTURE) IMPLANT
SUT VIC AB 0 CT1 36 (SUTURE) ×4 IMPLANT
SUT VIC AB 0 CTX 36 (SUTURE) ×8
SUT VIC AB 0 CTX36XBRD ANBCTRL (SUTURE) ×2 IMPLANT
SUT VIC AB 2-0 CT1 27 (SUTURE) ×2
SUT VIC AB 2-0 CT1 TAPERPNT 27 (SUTURE) ×1 IMPLANT
SYR CONTROL 10ML LL (SYRINGE) IMPLANT
TOWEL OR 17X24 6PK STRL BLUE (TOWEL DISPOSABLE) ×2 IMPLANT
TRAY FOLEY W/BAG SLVR 14FR LF (SET/KITS/TRAYS/PACK) IMPLANT
WATER STERILE IRR 1000ML POUR (IV SOLUTION) ×2 IMPLANT

## 2020-03-22 NOTE — Anesthesia Procedure Notes (Signed)
Spinal  Patient location during procedure: OR Start time: 03/22/2020 9:35 PM End time: 03/22/2020 9:37 PM Staffing Performed: anesthesiologist  Anesthesiologist: Lannie Fields, DO Preanesthetic Checklist Completed: patient identified, IV checked, risks and benefits discussed, surgical consent, monitors and equipment checked, pre-op evaluation and timeout performed Spinal Block Patient position: sitting Prep: DuraPrep and site prepped and draped Patient monitoring: cardiac monitor, continuous pulse ox and blood pressure Approach: midline Location: L3-4 Injection technique: single-shot Needle Needle type: Pencan  Needle gauge: 24 G Needle length: 9 cm Assessment Sensory level: T6 Additional Notes Functioning IV was confirmed and monitors were applied. Sterile prep and drape, including hand hygiene and sterile gloves were used. The patient was positioned and the spine was prepped. The skin was anesthetized with lidocaine.  Free flow of clear CSF was obtained prior to injecting local anesthetic into the CSF.  The spinal needle aspirated freely following injection.  The needle was carefully withdrawn.  The patient tolerated the procedure well.

## 2020-03-22 NOTE — Op Note (Signed)
03/22/2020  10:58 PM  PATIENT:  Kelly Navarro  34 y.o. female  PRE-OPERATIVE DIAGNOSIS:  repeaet cesarean section; breech presentation;  placental abruption  POST-OPERATIVE DIAGNOSIS:  repeat cesarean section; vertex presentation; placental abruption  PROCEDURE:  Procedure(s): CESAREAN SECTION (N/A)  Repeat low transverse cesarean section with 2 layer closure   SURGEON:  Surgeon(s) and Role:    Noland Fordyce, MD - Primary  PHYSICIAN ASSISTANT:   ASSISTANTS: Carlean Jews, CNM  ANESTHESIA:   spinal  EBL: Per nursing notes  BLOOD ADMINISTERED:none  DRAINS: Urinary Catheter (Foley)   LOCAL MEDICATIONS USED:  NONE  SPECIMEN:  Source of Specimen:  Placenta  DISPOSITION OF SPECIMEN:  To labor and delivery for disposal  COUNTS:  YES  TOURNIQUET:  * No tourniquets in log *  DICTATION: .Note written in EPIC  PLAN OF CARE: Admit to inpatient   PATIENT DISPOSITION:  PACU - hemodynamically stable.   Delay start of Pharmacological VTE agent (>24hrs) due to surgical blood loss or risk of bleeding: yes    Findings:  @BABYSEXEBC @ infant,  APGAR (1 MIN): 7   APGAR (5 MINS): 8   APGAR (10 MINS): 9   Normal uterus, tubes and ovaries, normal placenta. 3VC, clear amniotic fluid, vertex presentation, no evidence of a retroplacental clot, no couvelaire sign  EBL: Per nursing notes Antibiotics:   2g Ancef Complications: none  Indications: This is a 34 y.o. year-old, G4 P1-0-2-1 at [redacted]w[redacted]d admitted for repeat cesarean section due to prior C-section, breech presentation and concern for placental abruption based on office ultrasound today. Risks benefits and alternatives of the procedure were discussed with the patient who agreed to proceed  Procedure:  After informed consent was obtained the patient was taken to the operating room where spinal anesthesia was initiated.  She was prepped and draped in the normal sterile fashion in dorsal supine position with a leftward tilt.  A  foley catheter was in place.  Survey of the abdomen reveals pedis and probable transverse position.  A Pfannenstiel skin incision was made 2 cm above the pubic symphysis in the midline with the scalpel over the prior Pfannenstiel incision. dissection was carried down with the Bovie cautery until the fascia was reached. The fascia was incised in the midline. The incision was extended laterally with the Mayo scissors. The inferior aspect of the fascial incision was grasped with the Coker clamps, elevated up and the underlying rectus muscles were dissected off sharply. The superior aspect of the fascial incision was grasped with the Coker clamps elevated up and the underlying rectus muscles were dissected off sharply.  The peritoneum was entered sharply. The peritoneal incision was extended superiorly and inferiorly with good visualization of the bladder.  Bladder adhesions were released and the bladder flap was created sharply. the bladder blade was inserted and palpation was done to assess the fetal position and the location of the uterine vessels. The lower segment of the uterus was incised sharply with the scalpel and extended  bluntly in the cephalo-caudal fashion.  Infant was found to be in a vertex position.  The infant was grasped, brought to the incision,  rotated and the infant was delivered with fundal pressure. The nose and mouth were bulb suctioned. The cord was clamped and cut after 1 minute delay. The infant was handed off to the waiting pediatrician. The placenta was expressed. The uterus was exteriorized. The uterus was cleared of all clots and debris. The uterine incision was repaired with 0 Vicryl in a  running locked fashion.  A second layer of the same suture was used in an imbricating fashion to obtain excellent hemostasis.  About 5 figure-of-eight sutures were used along the uterine incision for additional hemostasis.  the uterus was then returned to the abdomen, the gutters were cleared of all  clots and debris. The uterine incision was reinspected and found to be hemostatic. The peritoneum was grasped and closed with 2-0 Vicryl in a running fashion. The cut muscle edges and the underside of the fascia were inspected and found to be hemostatic. The fascia was closed with 0 Vicryl in a single layer . The subcutaneous tissue was irrigated. Scarpa's layer was closed with a 2-0 plain gut suture. The skin was closed with a 4-0 Monocryl in a single layer. The patient tolerated the procedure well. Sponge lap and needle counts were correct x3 and patient was taken to the recovery room in a stable condition.  Lendon Colonel 03/22/2020 10:59 PM

## 2020-03-22 NOTE — H&P (Signed)
Kelly Navarro is a 34 y.o. G4P1011 at [redacted]w[redacted]d presenting for RCS. Pt notes no contractions. Good fetal movement, No vaginal bleeding, not leaking fluid.  [facility] first trimester -History of recurrent pregnancy loss.  MAB x2.  Work-up revealed uterine septum which was resected prior to this pregnancy and PAI 1 mutation.  Patient has been on Lovenox for most of the pregnancy and transition to heparin 10,000 units twice daily over the last few weeks -Gaucher disease.  Asymptomatic detected on routine screening. -Prior cesarean section for breech with a failed version in that pregnancy.  Patient was hopeful for trial of labor after cesarean in this pregnancy but found to be breech presentation again today -Size less than dates.  Ultrasound today revealed normal AFI, breech presentation, growth at the 33rd percentile, posterior placenta with about 30% of the placenta having swirling blood behind the placenta.   Prenatal Transfer Tool  Maternal Diabetes: No Genetic Screening: Normal Maternal Ultrasounds/Referrals: Normal Fetal Ultrasounds or other Referrals:  None Maternal Substance Abuse:  No Significant Maternal Medications:  None Significant Maternal Lab Results: Group B Strep negative     OB History    Gravida  4   Para  1   Term  1   Preterm      AB  1   Living  1     SAB  1   TAB      Ectopic      Multiple      Live Births  1          Past Medical History:  Diagnosis Date  . Bee sting allergy   . Blood transfusion without reported diagnosis 01/2019   folowing d&c  . 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 Eczema   . Gaucher disease, type I ([facility])    noted on Counsyl genetic testing 05/2015  . GERD (gastroesophageal reflux disease)   . Headache(784.0)    menstrual   . IBS (irritable bowel syndrome)   . Incomplete  miscarriage 02/22/2019  . Scoliosis    very mild   Past Surgical History:  Procedure Laterality Date  . CESAREAN SECTION N/A 10/21/2016   Procedure: CESAREAN SECTION;  Surgeon: Mitchel Honour, DO;  [facility]: [facility];  Service: Obstetrics;  Laterality: N/A;  . DILATION AND EVACUATION N/A 02/22/2019   Procedure: DILATATION AND EVACUATION;  Surgeon: Marcelle Overlie, MD;  Location: [facility] OR;  Service: Gynecology;  Laterality: N/A;  . GUM SURGERY    . OTHER SURGICAL HISTORY  2020   septate uterus, polyp  . WISDOM TOOTH EXTRACTION     Family History: family history includes Asthma in her sister; Breast cancer in her paternal grandmother; Cancer in her paternal grandfather; Diabetes in her maternal grandfather; Eczema in her sister; Healthy in her father; Heart disease in her maternal grandfather; Hypertension in her maternal grandfather and mother; Non-Hodgkin's lymphoma in her mother. Social History:  reports that she has never smoked. She has never used smokeless tobacco. She reports previous alcohol use of about 1.0 standard drink of alcohol per week. She reports that she does not use drugs.  Review of Systems - Negative except Discomfort of pregnancy     Blood pressure 122/80, pulse 96, temperature (!) 97.5 F (36.4 C), temperature source Oral, resp. rate 16, height 5\' 8"  (1.727 m), weight 94.3 kg, SpO2 97 %, unknown if currently breastfeeding.  Physical  Exam:  Gen: well appearing, no distress  Back: no CVAT Abd: gravid, NT, no RUQ pain LE: Trace edema, equal bilaterally, non-tender Toco: No FH: baseline 130s, accelerations present, no deceleratons, 10 beat variability  Prenatal labs: ABO, Rh:  AB+ Antibody:  Negative Rubella:  Immune RPR:   Nonreactive HBsAg:   Negative HIV:   Negative GBS:   Negative 1 hr Glucola 123  Genetic screening normal anatomy Anatomy US normal   Assessment/Plan: 34 y.o. G4P1011 at [redacted]w[redacted]d -Ultrasound done today for size less than dates  reveals concern for placental abruption.  Patient with out recent trauma, no contractions, no vaginal bleeding but large blood volume swirling behind one third of the placenta is concerning for impending abruption and recommend moved to delivery now.  Given breech presentation do not recommend attempted version and plan repeat cesarean section.  Will check coags.  Patient anticoagulated for history of PAI-1 and recurrent pregnancy loss.  Will plan repeat C-section with consultation with anesthesia after 12+ hours from her last heparin dose.  Should nonreassuring fetal testing or evidence of worsening abruption occur prior to scheduled C-section will move immediately with possible need for general anesthesia.  Risks and benefits discussed with patient and her partner who agreed to proceed-  -  Penicillin allergy, hives as a child.  We will plan Ancef -Current fetal testing is reactive  Lendon Colonel 03/22/2020, 5:26 PM

## 2020-03-22 NOTE — Transfer of Care (Signed)
Immediate Anesthesia Transfer of Care Note  Patient: Kelly Navarro  Procedure(s) Performed: CESAREAN SECTION (N/A )  Patient Location: PACU  Anesthesia Type:Spinal  Level of Consciousness: awake, alert  and oriented  Airway & Oxygen Therapy: Patient Spontanous Breathing  Post-op Assessment: Report given to RN and Post -op Vital signs reviewed and stable  Post vital signs: Reviewed and stable  Last Vitals:  Vitals Value Taken Time  BP 120/50 03/22/20 2307  Temp    Pulse 107 03/22/20 2312  Resp 14 03/22/20 2312  SpO2 98 % 03/22/20 2312  Vitals shown include unvalidated device data.  Last Pain:  Vitals:   03/22/20 2102  TempSrc: Oral  PainSc:       Patients Stated Pain Goal: 4 (03/22/20 1649)  Complications: No complications documented.

## 2020-03-22 NOTE — Brief Op Note (Signed)
03/22/2020  10:58 PM  PATIENT:  Kelly Navarro  34 y.o. female  PRE-OPERATIVE DIAGNOSIS:  repeaet cesarean section; breech presentation;  placental abruption  POST-OPERATIVE DIAGNOSIS:  repeat cesarean section; vertex presentation; placental abruption  PROCEDURE:  Procedure(s): CESAREAN SECTION (N/A)  Repeat low transverse cesarean section with 2 layer closure   SURGEON:  Surgeon(s) and Role:    Noland Fordyce, MD - Primary  PHYSICIAN ASSISTANT:   ASSISTANTS: Carlean Jews, CNM  ANESTHESIA:   spinal  EBL: Per nursing notes  BLOOD ADMINISTERED:none  DRAINS: Urinary Catheter (Foley)   LOCAL MEDICATIONS USED:  NONE  SPECIMEN:  Source of Specimen:  Placenta  DISPOSITION OF SPECIMEN:  To labor and delivery for disposal  COUNTS:  YES  TOURNIQUET:  * No tourniquets in log *  DICTATION: .Note written in EPIC  PLAN OF CARE: Admit to inpatient   PATIENT DISPOSITION:  PACU - hemodynamically stable.   Delay start of Pharmacological VTE agent (>24hrs) due to surgical blood loss or risk of bleeding: yes

## 2020-03-22 NOTE — Anesthesia Postprocedure Evaluation (Signed)
Anesthesia Post Note  Patient: Kelly Navarro  Procedure(s) Performed: CESAREAN SECTION (N/A )     Patient location during evaluation: PACU Anesthesia Type: Spinal Level of consciousness: oriented and awake and alert Pain management: pain level controlled Vital Signs Assessment: post-procedure vital signs reviewed and stable Respiratory status: spontaneous breathing and respiratory function stable Cardiovascular status: blood pressure returned to baseline and stable Postop Assessment: no headache, no backache, no apparent nausea or vomiting and spinal receding Anesthetic complications: no   No complications documented.  Last Vitals:  Vitals:   03/22/20 2330 03/22/20 2345  BP: 106/61 104/64  Pulse: (!) 117 100  Resp: (!) 25 14  Temp: 36.9 C   SpO2: 98% 96%    Last Pain:  Vitals:   03/22/20 2345  TempSrc:   PainSc: 0-No pain   Pain Goal: Patients Stated Pain Goal: 4 (03/22/20 1649)  LLE Motor Response: No movement due to regional block (03/22/20 2345) LLE Sensation: Tingling (03/22/20 2345) RLE Motor Response: No movement due to regional block (03/22/20 2345) RLE Sensation: Tingling (03/22/20 2345)     Epidural/Spinal Function Cutaneous sensation: Tingles (03/22/20 2345), Patient able to flex knees: No (03/22/20 2345), Patient able to lift hips off bed: No (03/22/20 2345), Back pain beyond tenderness at insertion site: No (03/22/20 2345), Progressively worsening motor and/or sensory loss: No (03/22/20 2345), Bowel and/or bladder incontinence post epidural: No (03/22/20 2345)  Lannie Fields

## 2020-03-22 NOTE — MAU Note (Signed)
Pt for Cesarean Section scheduled this evening secondary placental abruption noted on U/S today @ office visit.  Pt waiting to have surgery because of last meal eaten.  Denies VB.  Endorses +FM.

## 2020-03-22 NOTE — Anesthesia Preprocedure Evaluation (Signed)
Anesthesia Evaluation  Patient identified by MRN, date of birth, ID band Patient awake    Reviewed: Allergy & Precautions, NPO status , Patient's Chart, lab work & pertinent test results  Airway Mallampati: II  TM Distance: >3 FB Neck ROM: Full    Dental no notable dental hx.    Pulmonary neg pulmonary ROS,    Pulmonary exam normal breath sounds clear to auscultation       Cardiovascular negative cardio ROS Normal cardiovascular exam Rhythm:Regular Rate:Normal     Neuro/Psych negative neurological ROS  negative psych ROS   GI/Hepatic Neg liver ROS, GERD  Controlled and Medicated,  Endo/Other  Obesity BMI 32  Renal/GU negative Renal ROS  negative genitourinary   Musculoskeletal negative musculoskeletal ROS (+)   Abdominal   Peds negative pediatric ROS (+)  Hematology  (+) Blood dyscrasia, , PAI-1 on heparin 10,000 units BID sub Q- last dose 8am Normal coags   Anesthesia Other Findings   Reproductive/Obstetrics (+) Pregnancy                            Anesthesia Physical Anesthesia Plan  ASA: III  Anesthesia Plan: Spinal   Post-op Pain Management:    Induction:   PONV Risk Score and Plan: 3 and Ondansetron, Dexamethasone and Treatment may vary due to age or medical condition  Airway Management Planned: Natural Airway and Nasal Cannula  Additional Equipment: None  Intra-op Plan:   Post-operative Plan:   Informed Consent: I have reviewed the patients History and Physical, chart, labs and discussed the procedure including the risks, benefits and alternatives for the proposed anesthesia with the patient or authorized representative who has indicated his/her understanding and acceptance.       Plan Discussed with: CRNA  Anesthesia Plan Comments:         Anesthesia Quick Evaluation

## 2020-03-23 ENCOUNTER — Encounter (HOSPITAL_COMMUNITY): Payer: Self-pay | Admitting: Obstetrics

## 2020-03-23 DIAGNOSIS — O4593 Premature separation of placenta, unspecified, third trimester: Secondary | ICD-10-CM

## 2020-03-23 DIAGNOSIS — O34219 Maternal care for unspecified type scar from previous cesarean delivery: Secondary | ICD-10-CM

## 2020-03-23 LAB — CBC
HCT: 30.7 % — ABNORMAL LOW (ref 36.0–46.0)
Hemoglobin: 10.1 g/dL — ABNORMAL LOW (ref 12.0–15.0)
MCH: 31.1 pg (ref 26.0–34.0)
MCHC: 32.9 g/dL (ref 30.0–36.0)
MCV: 94.5 fL (ref 80.0–100.0)
Platelets: 155 10*3/uL (ref 150–400)
RBC: 3.25 MIL/uL — ABNORMAL LOW (ref 3.87–5.11)
RDW: 14 % (ref 11.5–15.5)
WBC: 9.3 10*3/uL (ref 4.0–10.5)
nRBC: 0 % (ref 0.0–0.2)

## 2020-03-23 LAB — RPR: RPR Ser Ql: NONREACTIVE

## 2020-03-23 MED ORDER — TETANUS-DIPHTH-ACELL PERTUSSIS 5-2.5-18.5 LF-MCG/0.5 IM SUSP: 0.5000 mL | Freq: Once | INTRAMUSCULAR | Status: DC

## 2020-03-23 MED ORDER — OXYTOCIN-SODIUM CHLORIDE 30-0.9 UT/500ML-% IV SOLN: 2.5000 [IU]/h | INTRAVENOUS | Status: AC

## 2020-03-23 MED ORDER — DIBUCAINE (PERIANAL) 1 % EX OINT: 1.0000 "application " | TOPICAL_OINTMENT | CUTANEOUS | Status: DC | PRN

## 2020-03-23 MED ORDER — OXYCODONE HCL 5 MG PO TABS
5.0000 mg | ORAL_TABLET | ORAL | Status: DC | PRN
  Administered 2020-03-24: 5 mg via ORAL
  Administered 2020-03-24 – 2020-03-25 (×7): 10 mg via ORAL
  Filled 2020-03-23 (×5): qty 2
  Filled 2020-03-23: qty 1
  Filled 2020-03-23 (×2): qty 2

## 2020-03-23 MED ORDER — MAGNESIUM OXIDE 400 (241.3 MG) MG PO TABS
400.0000 mg | ORAL_TABLET | Freq: Every day | ORAL | Status: DC
  Administered 2020-03-23 – 2020-03-25 (×3): 400 mg via ORAL
  Filled 2020-03-23 (×3): qty 1

## 2020-03-23 MED ORDER — IBUPROFEN 800 MG PO TABS
800.0000 mg | ORAL_TABLET | Freq: Three times a day (TID) | ORAL | Status: DC
  Administered 2020-03-23 – 2020-03-25 (×7): 800 mg via ORAL
  Filled 2020-03-23 (×7): qty 1

## 2020-03-23 MED ORDER — LACTATED RINGERS IV SOLN: INTRAVENOUS | Status: DC

## 2020-03-23 MED ORDER — DIPHENHYDRAMINE HCL 25 MG PO CAPS: 25.0000 mg | ORAL_CAPSULE | Freq: Four times a day (QID) | ORAL | Status: DC | PRN

## 2020-03-23 MED ORDER — PRENATAL MULTIVITAMIN CH
1.0000 | ORAL_TABLET | Freq: Every day | ORAL | Status: DC
  Administered 2020-03-23 – 2020-03-25 (×3): 1 via ORAL
  Filled 2020-03-23 (×3): qty 1

## 2020-03-23 MED ORDER — SENNOSIDES-DOCUSATE SODIUM 8.6-50 MG PO TABS
2.0000 | ORAL_TABLET | ORAL | Status: DC
  Administered 2020-03-24 – 2020-03-25 (×2): 2 via ORAL
  Filled 2020-03-23 (×2): qty 2

## 2020-03-23 MED ORDER — COCONUT OIL OIL
1.0000 "application " | TOPICAL_OIL | Status: DC | PRN
  Administered 2020-03-23: 1 via TOPICAL

## 2020-03-23 MED ORDER — POLYSACCHARIDE IRON COMPLEX 150 MG PO CAPS
150.0000 mg | ORAL_CAPSULE | Freq: Every day | ORAL | Status: DC
  Administered 2020-03-23 – 2020-03-25 (×3): 150 mg via ORAL
  Filled 2020-03-23 (×3): qty 1

## 2020-03-23 MED ORDER — SIMETHICONE 80 MG PO CHEW
80.0000 mg | CHEWABLE_TABLET | ORAL | Status: DC
  Administered 2020-03-24 – 2020-03-25 (×2): 80 mg via ORAL
  Filled 2020-03-23 (×2): qty 1

## 2020-03-23 MED ORDER — WITCH HAZEL-GLYCERIN EX PADS: 1.0000 "application " | MEDICATED_PAD | CUTANEOUS | Status: DC | PRN

## 2020-03-23 MED ORDER — ZOLPIDEM TARTRATE 5 MG PO TABS: 5.0000 mg | ORAL_TABLET | Freq: Every evening | ORAL | Status: DC | PRN

## 2020-03-23 MED ORDER — SIMETHICONE 80 MG PO CHEW
80.0000 mg | CHEWABLE_TABLET | Freq: Three times a day (TID) | ORAL | Status: DC
  Administered 2020-03-23 – 2020-03-25 (×8): 80 mg via ORAL
  Filled 2020-03-23 (×8): qty 1

## 2020-03-23 MED ORDER — MENTHOL 3 MG MT LOZG: 1.0000 | LOZENGE | OROMUCOSAL | Status: DC | PRN

## 2020-03-23 MED ORDER — SIMETHICONE 80 MG PO CHEW: 80.0000 mg | CHEWABLE_TABLET | ORAL | Status: DC | PRN

## 2020-03-23 MED ORDER — INFLUENZA VAC SPLIT QUAD 0.5 ML IM SUSY
0.5000 mL | PREFILLED_SYRINGE | INTRAMUSCULAR | Status: AC
  Administered 2020-03-25: 0.5 mL via INTRAMUSCULAR
  Filled 2020-03-23 (×2): qty 0.5

## 2020-03-23 NOTE — Lactation Note (Signed)
This note was copied from a baby's chart. Lactation Consultation Note  Patient Name: Kelly Navarro Today's Date: 03/23/2020 Reason for consult: Early term 53-38.6wks  P2 mother whose infant is now 60 hours old.  This is an ETI at 38+2 weeks.  Mother breast fed her first child (now 34 years old) for 14 months.  Baby was breast feeding when I arrived; nurse midwife assisted with latching.  Baby was latched in the cross cradle hold on the left breast.  Baby had a wide gape and flanged lips.  Mother denied pain with latching.  Discussed basic breast feeding concepts with her.  Mother is familiar with hand expression; encouraged hand expression before/after feedings to help increase milk supply.  Colostrum container provided and milk storage times discussed.  Finger feeding demonstrated.  Mother will continue to feed on cue or at least every three hours if baby remains sleeping and does not self awaken.  Mother will call for latch assistance as needed.  Discussed how to obtain a deep latch.  Observed baby feeding well for 10 minutes prior to my departure.  Mother inquired about a manual pump.  Provided the manual pump and mother feels comfortable with using this pump.  She will call for assistance as needed.  Mom made aware of O/P services, breastfeeding support groups, community resources, and our phone # for post-discharge questions. Father present.  Mother has a DEBP for home use.     Maternal Data Formula Feeding for Exclusion: No Has patient been taught Hand Expression?: Yes Does the patient have breastfeeding experience prior to this delivery?: Yes  Feeding Feeding Type: Breast Fed  LATCH Score Latch: Grasps breast easily, tongue down, lips flanged, rhythmical sucking.  Audible Swallowing: A few with stimulation  Type of Nipple: Everted at rest and after stimulation  Comfort (Breast/Nipple): Soft / non-tender  Hold (Positioning): No assistance needed to correctly position infant  at breast.  LATCH Score: 9  Interventions Interventions: Breast feeding basics reviewed;Assisted with latch;Skin to skin;Breast massage;Hand express;Breast compression;Adjust position;Position options;Support pillows  Lactation Tools Discussed/Used     Consult Status Consult Status: Follow-up Date: 03/24/20 Follow-up type: In-patient    Terence Googe R Tatanisha Cuthbert 03/23/2020, 11:10 AM

## 2020-03-23 NOTE — Progress Notes (Signed)
Subjective: POD# 1 Live born female  Birth Weight: 6 lb 12.3 oz (3070 g) APGAR: 7, 8  Newborn Delivery   Birth date/time: 03/22/2020 22:13:00 Delivery type: C-Section, Low Transverse Trial of labor: No C-section categorization: Repeat     Baby name: Kara Mead Delivering provider: Noland Fordyce   Feeding: breast  Pain control at delivery: Spinal   Reports feeling very well, mild dizziness with standing initially.  Patient reports tolerating PO.   Breast symptoms: + colostrum Pain controlled with PO meds Denies HA/SOB/C/P/N/V/dizziness. Flatus present. She reports vaginal bleeding as normal, without clots.  [facility], foley cath still in place.  Objective:   VS:    Vitals:   03/23/20 0145 03/23/20 0250 03/23/20 0420 03/23/20 0835  BP: 102/65 105/67 (!) 102/58 (!) 100/53  Pulse: (!) 107 97 95 77  Resp: 17 16 16 20   Temp: 98.3 F (36.8 C) 97.8 F (36.6 C) 98.6 F (37 C) 97.9 F (36.6 C)  TempSrc: Oral Axillary Oral Oral  SpO2: 98% 100% 98% 98%  Weight:      Height:          Intake/Output Summary (Last 24 hours) at 03/23/2020 1028 Last data filed at 03/23/2020 03/25/2020 Gross per 24 hour  Intake 2100 ml  Output 889 ml  Net 1211 ml        Recent Labs    03/22/20 1741 03/23/20 0518  WBC 9.7 9.3  HGB 13.0 10.1*  HCT 38.6 30.7*  PLT 208 155     Blood type: --/--/AB POS (09/23 1715)  Rubella:   immune Vaccines: TDaP UTD         Flu    Update prior to DC         COVID19  UTD   Physical Exam:  General: alert, cooperative and no distress CV: Regular rate and rhythm Resp: clear Abdomen: soft, nontender, normal bowel sounds Incision: clean, dry and intact Uterine Fundus: firm, below umbilicus, nontender Lochia: minimal Ext: no edema, redness or tenderness in the calves or thighs      Assessment/Plan: 34 y.o.   POD# 1. 20                  Principal Problem:   Postpartum care following cesarean delivery (9/23) Active Problems:   Breech  presentation   Placental abruption in third trimester   Previous cesarean delivery, delivered   Doing well, stable.               Advance diet as tolerated Encourage rest when baby rests Breastfeeding support Encourage to ambulate Routine post-op care  02-22-2004, CNM, MSN 03/23/2020, 10:28 AM

## 2020-03-24 MED ORDER — ACETAMINOPHEN 325 MG PO TABS
650.0000 mg | ORAL_TABLET | Freq: Four times a day (QID) | ORAL | Status: DC
  Administered 2020-03-24 – 2020-03-25 (×5): 650 mg via ORAL
  Filled 2020-03-24 (×5): qty 2

## 2020-03-24 NOTE — Progress Notes (Signed)
Subjective: POD# 2 Live born female  Birth Weight: 6 lb 12.3 oz (3070 g) APGAR: 7, 8  Newborn Delivery   Birth date/time: 03/22/2020 22:13:00 Delivery type: C-Section, Low Transverse Trial of labor: No C-section categorization: Repeat     Baby name: Kelly Navarro Delivering provider: Noland Fordyce    Feeding: breast  Pain control at delivery: Spinal   Reports feeling more pain last night with getting in and out of bed, but manageable   Patient reports tolerating PO.   Breast symptoms:+ colsotrum Pain controlled with acetaminophen and ibuprofen (OTC) Denies HA/SOB/C/P/N/V/dizziness. Flatus present. She reports vaginal bleeding as normal, without clots.  She is ambulating, urinating without difficulty.     Objective:   VS:    Vitals:   03/23/20 1215 03/23/20 1700 03/23/20 2136 03/24/20 0555  BP: 101/67 111/64 105/65 98/68  Pulse: 88 63 64 82  Resp: 18 16 16 17   Temp: 97.7 F (36.5 C) 98.1 F (36.7 C) 97.9 F (36.6 C) 99 F (37.2 C)  TempSrc: Oral Oral Oral Oral  SpO2: 98% 100% 100% 97%  Weight:      Height:          Intake/Output Summary (Last 24 hours) at 03/24/2020 0847 Last data filed at 03/23/2020 2016 Gross per 24 hour  Intake 360 ml  Output 3850 ml  Net -3490 ml        Recent Labs    03/22/20 1741 03/23/20 0518  WBC 9.7 9.3  HGB 13.0 10.1*  HCT 38.6 30.7*  PLT 208 155     Blood type: --/--/AB POS (09/23 1715)  Rubella:   immune Vaccines: TDaP UTD                    Flu    Update prior to DC                    COVID19  UTD    Physical Exam:  General: alert, cooperative and no distress Abdomen: soft, nontender, normal bowel sounds Incision: clean, dry and intact Uterine Fundus: firm, below umbilicus, nontender Lochia: minimal Ext: no edema, redness or tenderness in the calves or thighs   Assessment/Plan: 34 y.o.   POD# 2. 20                  Principal Problem:   Postpartum care following cesarean delivery (9/23) Active Problems:    Breech presentation   Placental abruption in third trimester   Previous cesarean delivery, delivered   Doing well, stable.    Routine post-op care Considering DC home later today.  02-22-2004, CNM, MSN 03/24/2020, 8:47 AM

## 2020-03-24 NOTE — Lactation Note (Signed)
This note was copied from a baby's chart. Lactation Consultation Note  Patient Name: Kelly Navarro [id number] Date: 03/24/2020 Reason for consult: Follow-up assessment;Early term 37-38.6wks  Follow up visit to 38 hours infant 5.86% weight loss of P2 with breastfeeding experience. Infant is breastfeeding, right breast, modified cradle hold upon arrival. Mother states infant has been at breast for ~5 minutes. Mother verbalizes a little discomfort. Demonstrated chin tug and encouraged mother to do it when feels discomfort. Mother verbalized improvement after tug. Infant seems comfortable, swallowing and sucking rhythmically. Mother massages breast while breastfeeding. Talked about how to keep baby awake during session, last session lasted ~ 60 minutes. Mother mentions infant has good output.  Discussed supplementing with express colostrum with a spoon, burping and offering other breast.    Parents are aware to call lactation as needed, reviewed resources available. Parents are expecting to be discharged today.     Maternal Data Has patient been taught Hand Expression?: Yes Does the patient have breastfeeding experience prior to this delivery?: Yes  Feeding Feeding Type: Breast Fed  LATCH Score Latch: Grasps breast easily, tongue down, lips flanged, rhythmical sucking.  Audible Swallowing: Spontaneous and intermittent  Type of Nipple: Everted at rest and after stimulation  Comfort (Breast/Nipple): Soft / non-tender  Hold (Positioning): No assistance needed to correctly position infant at breast.  LATCH Score: 10  Interventions Interventions: Breast feeding basics reviewed;Support pillows   Consult Status Consult Status: Complete Date: 03/24/20 Follow-up type: Call as needed    Kelly Navarro 03/24/2020, 12:40 PM

## 2020-03-25 DIAGNOSIS — O99119 Other diseases of the blood and blood-forming organs and certain disorders involving the immune mechanism complicating pregnancy, unspecified trimester: Secondary | ICD-10-CM

## 2020-03-25 DIAGNOSIS — D6859 Other primary thrombophilia: Secondary | ICD-10-CM

## 2020-03-25 MED ORDER — ASPIRIN EC 81 MG PO TBEC: 81.0000 mg | DELAYED_RELEASE_TABLET | Freq: Every day | ORAL | 2 refills | Status: AC

## 2020-03-25 MED ORDER — OXYCODONE HCL 5 MG PO TABS
5.0000 mg | ORAL_TABLET | ORAL | 0 refills | Status: DC | PRN
Start: 2020-03-25 — End: 2020-08-01

## 2020-03-25 MED ORDER — MAGNESIUM OXIDE 400 (241.3 MG) MG PO TABS
400.0000 mg | ORAL_TABLET | Freq: Every day | ORAL | 1 refills | Status: DC
Start: 2020-03-26 — End: 2020-08-01

## 2020-03-25 MED ORDER — ACETAMINOPHEN 325 MG PO TABS
650.0000 mg | ORAL_TABLET | Freq: Four times a day (QID) | ORAL | 1 refills | Status: DC
Start: 2020-03-25 — End: 2020-08-01

## 2020-03-25 MED ORDER — IBUPROFEN 800 MG PO TABS
800.0000 mg | ORAL_TABLET | Freq: Three times a day (TID) | ORAL | 0 refills | Status: DC
Start: 2020-03-25 — End: 2022-05-05

## 2020-03-25 MED ORDER — COCONUT OIL OIL: 1.0000 "application " | TOPICAL_OIL | 0 refills | Status: DC | PRN

## 2020-03-25 MED ORDER — POLYSACCHARIDE IRON COMPLEX 150 MG PO CAPS
150.0000 mg | ORAL_CAPSULE | Freq: Every day | ORAL | 1 refills | Status: DC
Start: 2020-03-26 — End: 2020-08-01

## 2020-03-25 NOTE — Discharge Instructions (Signed)
Preparing for Vaginal Birth After Cesarean Delivery Vaginal birth after cesarean delivery (VBAC) is giving birth vaginally after previously delivering a baby through a cesarean section (C-section). You and your health are provider will discuss your options and whether you may be a good candidate for VBAC. What are my options? After a cesarean delivery, your options for future deliveries may include:  Scheduled repeat cesarean delivery. This is done in a hospital with an operating room.  Trial of labor after cesarean (TOLAC). A successful TOLAC results in a vaginal delivery. If it is not successful, you will need to have a cesarean delivery. TOLAC should be attempted in facilities where an emergency cesarean delivery can be performed. It should not be done as a home birth. Talk with your health care provider about the risks and benefits of each option early in your pregnancy. The best option for you will depend on your preferences and your overall health as well as your baby's. What should I know about my past cesarean delivery? It is important to know what type of incision was made in your uterus in a past cesarean delivery. The type of incision can affect the success of your TOLAC. Types of incisions include:  Low transverse. This is a side-to-side cut low on your uterus. The scar on your skin looks like a horizontal line just above your pubic area. This type of cut is the most common and makes you a good candidate for TOLAC.  Low vertical. This is an up-and-down cut low on your uterus. The scar on your skin looks like a vertical line between your pubic area and belly button. This type of cut puts you at higher risk for problems during TOLAC.  High vertical or classical. This is an up-and-down cut high on your uterus. The scar on your skin looks like a vertical line that runs over the top of your belly button. This type of cut has the highest risk for problems and usually means that TOLAC is not an  option. When is VBAC not an option? As you progress through your pregnancy, circumstances may change and you may need to reconsider your options. Your situation may also change even as you begin TOLAC. Your health care provider may not want you to attempt a VBAC if you:  Need to have labor started (induced) because your cervix is not ready for labor.  Have never had a vaginal delivery.  Have had more than two cesarean deliveries.  Are overdue.  Are pregnant with a very large baby.  Have a condition that causes high blood pressure (preeclampsia). Questions to ask your health care provider  Am I a good candidate for TOLAC?  What are my chances of a successful vaginal delivery?  Is my preferred birth location equipped for a TOLAC?  What are my pain management options during a TOLAC? Where to find more information  American Congress of Obstetricians and Gynecologists: www.acog.org  American College of Nurse-Midwives: www.midwife.org Summary  Vaginal birth after cesarean delivery (VBAC) is giving birth vaginally after previously delivering a baby through a cesarean section (C-section).  VBAC may be a safe and appropriate option for you depending on your medical history and other risk factors. Talk with your health care provider about the options available to you, and the risks and benefits of each early in your pregnancy.  TOLAC should be attempted in facilities where emergency cesarean section procedures can be performed. This information is not intended to replace advice given to you by   your health care provider. Make sure you discuss any questions you have with your health care provider. Document Revised: 10/12/2018 Document Reviewed: 09/25/2016 Elsevier Patient Education  2020 [employer].   Lactation outpatient support - home visit  Linnell Fulling RN, [place], [employer] at [employer]: Lactation Consultant  [email or web address]    Additional  resources:  [facility] [email or web address]://ibconline.ca/information-[email or web address]   Chiropractic specialist   Dr. Iva Boop [email or web address]  Craniosacral therapy for baby  Kathryne Eriksson  [email or web address]

## 2020-03-25 NOTE — Discharge Summary (Signed)
OB Discharge Summary  Patient Name: Kelly Navarro DOB: 05/14/1986 MRN: [id number]  Date of admission: 03/22/2020 Delivering provider: Noland Fordyce   Admitting diagnosis: Breech presentation [O32.1XX0] Intrauterine pregnancy: [redacted]w[redacted]d     Secondary diagnosis: Patient Active Problem List   Diagnosis Date Noted  . Thrombophilia affecting pregnancy, antepartum (PAI) 03/25/2020  . Postpartum care following cesarean delivery (9/23) 03/23/2020  . Placental abruption in third trimester 03/23/2020  . Previous cesarean delivery, delivered 03/23/2020  . Breech presentation 03/22/2020  . Gaucher disease, type I (HCC) 07/10/2015   Additional problems: None   Date of discharge: 03/25/2020   Discharge diagnosis: Principal Problem:   Postpartum care following cesarean delivery (9/23) Active Problems:   Gaucher disease, type I (HCC)   Breech presentation   Placental abruption in third trimester   Previous cesarean delivery, delivered   Thrombophilia affecting pregnancy, antepartum (PAI)                                                      Post partum procedures:None  Augmentation: N/A Pain control: Spinal  Laceration:None  Episiotomy:None  Complications: None  Hospital course:  Scheduled C/S   34 y.o. yo [id number] at [redacted]w[redacted]d was admitted to the hospital 03/22/2020 for scheduled cesarean section with the following indication:Malpresentation and suspected placental abruption.Delivery details are as follows:  Membrane Rupture Time/Date: 10:12 PM ,03/22/2020   Delivery Method:C-Section, Low Transverse  Details of operation can be found in separate operative note.  Patient had an uncomplicated postpartum course.  She is ambulating, tolerating a regular diet, passing flatus, and urinating well. Patient is discharged home in stable condition on  03/25/20        Newborn Data: Birth date:03/22/2020  Birth time:10:13 PM  Gender:Female  Living status:Living  Apgars:7 ,8  Weight:3070 g     Physical exam   Vitals:   03/23/20 2136 03/24/20 0555 03/24/20 2104 03/25/20 0517  BP: 105/65 98/68 115/70 108/64  Pulse: 64 82 73 78  Resp: 16 17 17    Temp: 97.9 F (36.6 C) 99 F (37.2 C) 98 F (36.7 C) 98.5 F (36.9 C)  TempSrc: Oral Oral Oral Oral  SpO2: 100% 97% 98% 97%  Weight:      Height:       General: alert, cooperative and no distress Lochia: appropriate Uterine Fundus: firm Incision: Healing well with no significant drainage, No significant erythema, Dressing is clean, dry, and intact DVT Evaluation: No evidence of DVT seen on physical exam. Labs: Lab Results  Component Value Date   WBC 9.3 03/23/2020   HGB 10.1 (L) 03/23/2020   HCT 30.7 (L) 03/23/2020   MCV 94.5 03/23/2020   PLT 155 03/23/2020   CMP Latest Ref Rng & Units 08/05/2016  Glucose 65 - 99 mg/dL 92  BUN 6 - 20 mg/dL 7  Creatinine 10/03/2016 - 9.02 mg/dL 4.09)  Sodium 7.35(H - 299 mmol/L 135  Potassium 3.5 - 5.1 mmol/L 3.7  Chloride 101 - 111 mmol/L 105  CO2 22 - 32 mmol/L 23  Calcium 8.9 - 10.3 mg/dL 242)  Total Protein 6.5 - 8.1 g/dL 6.7  Total Bilirubin 0.3 - 1.2 mg/dL 0.6  Alkaline Phos 38 - 126 U/L 68  AST 15 - 41 U/L 20  ALT 14 - 54 U/L 15   Edinburgh Postnatal Depression Scale Screening Tool 03/23/2020 03/23/2020  I have been able to laugh and see the funny side of things. 0 (No Data)  I have looked forward with enjoyment to things. 0 -  I have blamed myself unnecessarily when things went wrong. 1 -  I have been anxious or worried for no good reason. 2 -  I have felt scared or panicky for no good reason. 2 -  Things have been getting on top of me. 1 -  I have been so unhappy that I have had difficulty sleeping. 1 -  I have felt sad or miserable. 0 -  I have been so unhappy that I have been crying. 0 -  The thought of harming myself has occurred to me. 0 -  Edinburgh Postnatal Depression Scale Total 7 -   Vaccines: TDaP UTD         Flu    At discharge         COVID-19   UTD  Discharge instruction:   per After Visit Summary,  [facility] booklet and  "Understanding Mother & Baby Care" hospital booklet  After Visit Meds:  Allergies as of 03/25/2020      Reactions   Amoxicillin Hives   Has patient had a PCN reaction causing immediate rash, facial/tongue/throat swelling, SOB or lightheadedness with hypotension: Yes Has patient had a PCN reaction causing severe rash involving mucus membranes or skin necrosis: No Has patient had a PCN reaction that required hospitalization No Has patient had a PCN reaction occurring within the last 10 years: No If all of the above answers are "NO", then may proceed with Cephalosporin use.   Bee Venom Other (See Comments)   Tongue swelling   Macrodantin [nitrofurantoin Macrocrystal] Nausea And Vomiting   Penicillins Hives   Has patient had a PCN reaction causing immediate rash, facial/tongue/throat swelling, SOB or lightheadedness with hypotension: Yes Has patient had a PCN reaction causing severe rash involving mucus membranes or skin necrosis: No Has patient had a PCN reaction that required hospitalization No Has patient had a PCN reaction occurring within the last 10 years: No If all of the above answers are "NO", then may proceed with Cephalosporin use.   Pineapple    Itchy throat   Sulfa Antibiotics Hives      Medication List    STOP taking these medications   EPINEPHrine 0.3 mg/0.3 mL Soaj injection Commonly known as: EPI-PEN   famotidine 20 MG tablet Commonly known as: PEPCID   heparin 81829 UNIT/ML injection   polyethylene glycol powder 17 GM/SCOOP powder Commonly known as: GLYCOLAX/MIRALAX     TAKE these medications   acetaminophen 325 MG tablet Commonly known as: TYLENOL Take 2 tablets (650 mg total) by mouth every 6 (six) hours. What changed:   when to take this  reasons to take this   aspirin EC 81 MG tablet Take 1 tablet (81 mg total) by mouth daily. Swallow whole.   coconut oil Oil Apply 1 application topically as  needed.   ibuprofen 800 MG tablet Commonly known as: ADVIL Take 1 tablet (800 mg total) by mouth every 8 (eight) hours.   iron polysaccharides 150 MG capsule Commonly known as: NIFEREX Take 1 capsule (150 mg total) by mouth daily. Start taking on: March 26, 2020   magnesium oxide 400 (241.3 Mg) MG tablet Commonly known as: MAG-OX Take 1 tablet (400 mg total) by mouth daily. Start taking on: March 26, 2020   oxyCODONE 5 MG immediate release tablet Commonly known as: Oxy IR/ROXICODONE Take  1-2 tablets (5-10 mg total) by mouth every 4 (four) hours as needed for moderate pain.   prenatal multivitamin Tabs tablet Take 1 tablet by mouth daily at 12 noon.            Discharge Care Instructions  (From admission, onward)         Start     Ordered   03/25/20 0000  Discharge wound care:       Comments: Sitz baths 2 times /day with warm water x 1 week. May add herbals: 1 ounce dried comfrey leaf* 1 ounce calendula flowers 1 ounce lavender flowers  Supplies can be found online at [employer] sources at [employer], [employer]  1/2 ounce dried uva ursi leaves 1/2 ounce witch hazel blossoms (if you can find them) 1/2 ounce dried sage leaf 1/2 cup sea salt Directions: Bring 2 quarts of water to a boil. Turn off heat, and place 1 ounce (approximately 1 large handful) of the above mixed herbs (not the salt) into the pot. Steep, covered, for 30 minutes.  Strain the liquid well with a fine mesh strainer, and discard the herb material. Add 2 quarts of liquid to the tub, along with the 1/2 cup of salt. This medicinal liquid can also be made into compresses and peri-rinses.   03/25/20 1128         Diet: routine diet  Activity: Advance as tolerated. Pelvic rest for 6 weeks.   Postpartum contraception: Not Discussed  Newborn Data: Live born female  Birth Weight: 6 lb 12.3 oz (3070 g) APGAR: 7, 8  Newborn Delivery   Birth date/time: 03/22/2020  22:13:00 Delivery type: C-Section, Low Transverse Trial of labor: No C-section categorization: Repeat     Named Kara Mead Baby Feeding: Breast Disposition:home with mother  Delivery Report:   Review the Delivery Report for details.    Follow up:  Follow-up Information    June Leap, CNM. Schedule an appointment as soon as possible for a visit in 2 week(s).   Specialty: Certified Nurse Midwife Why: Please make an appointment for 2 weeks postpartum.  Contact information: [address] [place] [address] [phone]              Signed: Clancy Gourd, MSN 03/25/2020, 11:31 AM

## 2020-03-26 ENCOUNTER — Encounter: Payer: Self-pay | Admitting: Family Medicine

## 2020-03-26 LAB — BPAM RBC
Blood Product Expiration Date: 202110152359
Blood Product Expiration Date: 202110162359
ISSUE DATE / TIME: 202109231004
ISSUE DATE / TIME: 202109231004
Unit Type and Rh: 6200
Unit Type and Rh: 6200

## 2020-03-26 LAB — TYPE AND SCREEN
ABO/RH(D): AB POS
Antibody Screen: NEGATIVE
Unit division: 0
Unit division: 0

## 2020-05-02 ENCOUNTER — Encounter: Payer: Self-pay | Admitting: Family Medicine

## 2020-05-02 DIAGNOSIS — J40 Bronchitis, not specified as acute or chronic: Secondary | ICD-10-CM

## 2020-05-04 MED ORDER — AZITHROMYCIN 250 MG PO TABS: ORAL_TABLET | ORAL | 0 refills | Status: AC

## 2020-05-16 ENCOUNTER — Encounter: Payer: BC Managed Care – PPO | Admitting: Family Medicine

## 2020-06-02 DIAGNOSIS — N61 Mastitis without abscess: Secondary | ICD-10-CM | POA: Insufficient documentation

## 2020-07-02 ENCOUNTER — Encounter: Payer: Self-pay | Admitting: Family Medicine

## 2020-08-01 ENCOUNTER — Encounter: Payer: Self-pay | Admitting: Family Medicine

## 2020-08-01 ENCOUNTER — Ambulatory Visit (INDEPENDENT_AMBULATORY_CARE_PROVIDER_SITE_OTHER): Payer: 59 | Admitting: Family Medicine

## 2020-08-01 ENCOUNTER — Other Ambulatory Visit: Payer: Self-pay

## 2020-08-01 VITALS — BP 84/60 | HR 63 | Temp 97.7°F | Ht 68.25 in | Wt 192.3 lb

## 2020-08-01 DIAGNOSIS — Z131 Encounter for screening for diabetes mellitus: Secondary | ICD-10-CM

## 2020-08-01 DIAGNOSIS — L309 Dermatitis, unspecified: Secondary | ICD-10-CM | POA: Diagnosis not present

## 2020-08-01 DIAGNOSIS — Z13220 Encounter for screening for lipoid disorders: Secondary | ICD-10-CM

## 2020-08-01 DIAGNOSIS — R5383 Other fatigue: Secondary | ICD-10-CM | POA: Diagnosis not present

## 2020-08-01 DIAGNOSIS — N61 Mastitis without abscess: Secondary | ICD-10-CM

## 2020-08-01 DIAGNOSIS — Z0000 Encounter for general adult medical examination without abnormal findings: Secondary | ICD-10-CM

## 2020-08-01 LAB — COMPREHENSIVE METABOLIC PANEL
ALT: 15 U/L (ref 0–35)
AST: 14 U/L (ref 0–37)
Albumin: 4.5 g/dL (ref 3.5–5.2)
Alkaline Phosphatase: 101 U/L (ref 39–117)
BUN: 11 mg/dL (ref 6–23)
CO2: 28 mEq/L (ref 19–32)
Calcium: 9.7 mg/dL (ref 8.4–10.5)
Chloride: 104 mEq/L (ref 96–112)
Creatinine, Ser: 0.64 mg/dL (ref 0.40–1.20)
GFR: 114.83 mL/min (ref >60.00)
Glucose, Bld: 84 mg/dL (ref 70–99)
Potassium: 4.1 mEq/L (ref 3.5–5.1)
Sodium: 139 mEq/L (ref 135–145)
Total Bilirubin: 0.5 mg/dL (ref 0.2–1.2)
Total Protein: 8 g/dL (ref 6.0–8.3)

## 2020-08-01 LAB — IBC + FERRITIN
Ferritin: 93 ng/mL (ref 10.0–291.0)
Iron: 47 ug/dL (ref 42–145)
Saturation Ratios: 12.9 % — ABNORMAL LOW (ref 20.0–50.0)
Transferrin: 260 mg/dL (ref 212.0–360.0)

## 2020-08-01 LAB — CBC WITH DIFFERENTIAL/PLATELET
Basophils Absolute: 0 10*3/uL (ref 0.0–0.1)
Basophils Relative: 0.7 % (ref 0.0–3.0)
Eosinophils Absolute: 0.1 10*3/uL (ref 0.0–0.7)
Eosinophils Relative: 1.9 % (ref 0.0–5.0)
HCT: 37.6 % (ref 36.0–46.0)
Hemoglobin: 12.6 g/dL (ref 12.0–15.0)
Lymphocytes Relative: 26.8 % (ref 12.0–46.0)
Lymphs Abs: 2 10*3/uL (ref 0.7–4.0)
MCHC: 33.6 g/dL (ref 30.0–36.0)
MCV: 86.4 fl (ref 78.0–100.0)
Monocytes Absolute: 0.4 10*3/uL (ref 0.1–1.0)
Monocytes Relative: 5.2 % (ref 3.0–12.0)
Neutro Abs: 4.8 10*3/uL (ref 1.4–7.7)
Neutrophils Relative %: 65.4 % (ref 43.0–77.0)
Platelets: 212 10*3/uL (ref 150.0–400.0)
RBC: 4.35 Mil/uL (ref 3.87–5.11)
RDW: 13 % (ref 11.5–15.5)
WBC: 7.3 10*3/uL (ref 4.0–10.5)

## 2020-08-01 LAB — TSH: TSH: 1.32 u[IU]/mL (ref 0.35–4.50)

## 2020-08-01 LAB — HEMOGLOBIN A1C: Hgb A1c MFr Bld: 5.4 % (ref 4.6–6.5)

## 2020-08-01 LAB — T4, FREE: Free T4: 0.72 ng/dL (ref 0.60–1.60)

## 2020-08-01 LAB — LIPID PANEL
Cholesterol: 139 mg/dL (ref 0–200)
HDL: 42 mg/dL (ref >39.00)
NonHDL: 97.28
Total CHOL/HDL Ratio: 3
Triglycerides: 206 mg/dL — ABNORMAL HIGH (ref 0.0–149.0)
VLDL: 41.2 mg/dL — ABNORMAL HIGH (ref 0.0–40.0)

## 2020-08-01 LAB — LDL CHOLESTEROL, DIRECT: Direct LDL: 36 mg/dL

## 2020-08-01 MED ORDER — BETAMETHASONE DIPROPIONATE 0.05 % EX CREA: TOPICAL_CREAM | Freq: Two times a day (BID) | CUTANEOUS | 2 refills | Status: DC

## 2020-08-01 MED ORDER — CEPHALEXIN 500 MG PO CAPS: 500.0000 mg | ORAL_CAPSULE | Freq: Four times a day (QID) | ORAL | 0 refills | Status: AC

## 2020-08-01 NOTE — Progress Notes (Signed)
Kelly Navarro DOB: Dec 26, 1985 Encounter date: 08/01/2020  This is a 35 y.o. female who presents for complete physical   History of present illness/Additional concerns: Had baby in 02/2020 girl. She is doing well. "Kelly Navarro". Going from 1 to 2 kids.   Last night started to have pain in left breast and chills. Wondering if she has mastitis. Got covid booster in November and didn't feel well, but then two days later started with lump in left breast- was started on antibiotics, but not sure if just lymph node. She is breast feeding now. Had issue with pump yesterday at work and didn't get full pump in yesterday.   She is getting sleep - getting full stretch of sleep.   Mood has been ok; up and down in first few months. Thinks more related to lack of sleep. Some days feeling a little low. Has been seeing therapist for a long time. Took a break for a little bit. Some days feels anxious about everything. Usually can pull self out of concerns, but just a lot. 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   Has follow up scheduled with obgyn for post partum follow up, so not sure if she should ask some questions to her. Sweating more; notes more under breasts. Different odor. No irritation under breasts. But eczema is flared on back of legs. She has been using ceravae cream; but has old rx for protopic.   She has had to cut out dairy in diet due to daughter having dairy intolerance.  She doesn't feel that weight is coming off as easily as it did with prior pregnancy. She is stuck around prepregnancy weight.   Past Medical History:  Diagnosis Date  . Bee sting allergy   . Blood transfusion without reported diagnosis 01/2019   folowing d&c  . Breech presentation 03/22/2020  . 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 Eczema   . Gaucher disease, type I ([facility])     noted on [facility] genetic testing 05/2015  . GERD (gastroesophageal reflux disease)   . Headache(784.0)    menstrual   . IBS (irritable bowel syndrome)   . Incomplete miscarriage 02/22/2019  . Scoliosis    very mild   Past Surgical History:  Procedure Laterality Date  . CESAREAN SECTION N/A 10/21/2016   Procedure: CESAREAN SECTION;  Surgeon: Linda Hedges, DO;  Location: [facility];  Service: Obstetrics;  Laterality: N/A;  . CESAREAN SECTION N/A 03/22/2020   Procedure: CESAREAN SECTION;  Surgeon: Aloha Gell, MD;  Location: [facility];  Service: Obstetrics;  Laterality: N/A;  . DILATION AND EVACUATION N/A 02/22/2019   Procedure: DILATATION AND EVACUATION;  Surgeon: Dian Queen, MD;  Location: [facility];  Service: Gynecology;  Laterality: N/A;  . GUM SURGERY    . OTHER SURGICAL HISTORY  2020   septate uterus, polyp  . WISDOM TOOTH EXTRACTION     Allergies  Allergen Reactions  . Amoxicillin Hives    Has patient had a PCN reaction causing immediate rash, facial/tongue/throat swelling, SOB or lightheadedness with hypotension: Yes Has patient had a PCN reaction causing severe rash involving mucus membranes or skin necrosis: No Has patient had a PCN reaction that required hospitalization No Has patient had a PCN reaction occurring within the last 10 years: No If all of the above answers are "NO", then may proceed with Cephalosporin use.  Kelly Navarro Other (  See Comments)    Tongue swelling  . Macrodantin [Nitrofurantoin Macrocrystal] Nausea And Vomiting  . Penicillins Hives    Has patient had a PCN reaction causing immediate rash, facial/tongue/throat swelling, SOB or lightheadedness with hypotension: Yes Has patient had a PCN reaction causing severe rash involving mucus membranes or skin necrosis: No Has patient had a PCN reaction that required hospitalization No Has patient had a PCN reaction occurring within the last 10 years: No If all of the above answers are "NO", then may  proceed with Cephalosporin use.  Kelly Navarro Pineapple     Itchy throat  . Sulfa Antibiotics Hives   Current Meds  Medication Sig  . betamethasone dipropionate 0.05 % cream Apply topically 2 (two) times daily.  . cephALEXin (KEFLEX) 500 MG capsule Take 1 capsule (500 mg total) by mouth 4 (four) times daily for 7 days.  Kelly Navarro ibuprofen (ADVIL) 800 MG tablet Take 1 tablet (800 mg total) by mouth every 8 (eight) hours.  . Prenatal Vit-Fe Fumarate-FA (PRENATAL MULTIVITAMIN) TABS tablet Take 1 tablet by mouth daily at 12 noon.   Social History   Tobacco Use  . Smoking status: Never Smoker  . Smokeless tobacco: Never Used  Substance Use Topics  . Alcohol use: Not Currently    Alcohol/week: 1.0 standard drink    Types: 1 Standard drinks or equivalent per week    Comment: none with pregnancy   Family History  Problem Relation Age of Onset  . Hypertension Mother   . Non-Hodgkin's lymphoma Mother   . Healthy Father   . Asthma Sister   . Eczema Sister   . Diabetes Maternal Grandfather   . Hypertension Maternal Grandfather   . Heart disease Maternal Grandfather   . Breast cancer Paternal Grandmother        Age 67  . Cancer Paternal Grandfather        multiple myeloma     Review of Systems  Constitutional: Positive for fatigue. Negative for activity change, appetite change, chills, fever and unexpected weight change.  HENT: Negative for congestion, ear pain, hearing loss, sinus pressure, sinus pain, sore throat and trouble swallowing.   Eyes: Negative for pain and visual disturbance.  Respiratory: Negative for cough, chest tightness, shortness of breath and wheezing.   Cardiovascular: Negative for chest pain, palpitations and leg swelling.  Gastrointestinal: Negative for abdominal pain, blood in stool, constipation, diarrhea, nausea and vomiting.  Genitourinary: Negative for difficulty urinating and menstrual problem.  Musculoskeletal: Negative for arthralgias and back pain.  Skin: Negative for  rash.  Neurological: Negative for dizziness, weakness, numbness and headaches.  Hematological: Negative for adenopathy. Does not bruise/bleed easily.  Psychiatric/Behavioral: Negative for sleep disturbance and suicidal ideas. The patient is not nervous/anxious.     CBC:  Lab Results  Component Value Date   WBC 7.3 08/01/2020   HGB 12.6 08/01/2020   HCT 37.6 08/01/2020   MCH 31.1 03/23/2020   MCHC 33.6 08/01/2020   RDW 13.0 08/01/2020   PLT 212.0 08/01/2020   MPV 11.1 11/08/2014   CMP: Lab Results  Component Value Date   NA 139 08/01/2020   K 4.1 08/01/2020   CL 104 08/01/2020   CO2 28 08/01/2020   ANIONGAP 7 08/05/2016   GLUCOSE 84 08/01/2020   BUN 11 08/01/2020   CREATININE 0.64 08/01/2020   CREATININE 0.76 11/08/2014   GFRAA >60 08/05/2016   GFRAA >89 11/08/2014   CALCIUM 9.7 08/01/2020   PROT 8.0 08/01/2020   BILITOT 0.5 08/01/2020  ALKPHOS 101 08/01/2020   ALT 15 08/01/2020   AST 14 08/01/2020   LIPID: Lab Results  Component Value Date   CHOL 139 08/01/2020   TRIG 206.0 (H) 08/01/2020   HDL 42.00 08/01/2020   LDLCALC 31 11/08/2014    Objective:  BP (!) 84/60   Pulse 63   Temp 97.7 F (36.5 C)   Ht 5' 8.25" (1.734 m)   Wt 192 lb 4.8 oz (87.2 kg)   LMP 05/31/2019 (Approximate)   Breastfeeding Yes   BMI 29.03 kg/m   Weight: 192 lb 4.8 oz (87.2 kg)   BP Readings from Last 3 Encounters:  08/01/20 (!) 84/60  03/25/20 108/64  01/18/20 98/70   Wt Readings from Last 3 Encounters:  08/01/20 192 lb 4.8 oz (87.2 kg)  03/22/20 208 lb (94.3 kg)  01/18/20 196 lb 14.4 oz (89.3 kg)    Physical Exam Constitutional:      General: She is not in acute distress.    Appearance: She is well-developed and well-nourished.  HENT:     Head: Normocephalic and atraumatic.     Right Ear: External ear normal.     Left Ear: External ear normal.     Mouth/Throat:     Mouth: Oropharynx is clear and moist.     Pharynx: No oropharyngeal exudate.  Eyes:      Conjunctiva/sclera: Conjunctivae normal.     Pupils: Pupils are equal, round, and reactive to light.  Neck:     Thyroid: No thyromegaly.  Cardiovascular:     Rate and Rhythm: Normal rate and regular rhythm.     Heart sounds: Normal heart sounds. No murmur heard. No friction rub. No gallop.   Pulmonary:     Effort: Pulmonary effort is normal.     Breath sounds: Normal breath sounds.  Chest:       Comments: Erythema lateral and inferior to the left areola.  Mild tenderness to palpation in this area.  No masses palpated.  No associated lymphadenopathy. Abdominal:     General: Bowel sounds are normal. There is no distension.     Palpations: Abdomen is soft. There is no mass.     Tenderness: There is no abdominal tenderness. There is no guarding.     Hernia: No hernia is present.  Musculoskeletal:        General: No tenderness, deformity or edema. Normal range of motion.     Cervical back: Normal range of motion and neck supple.  Lymphadenopathy:     Cervical: No cervical adenopathy.  Skin:    General: Skin is warm and dry.     Findings: No rash.  Neurological:     Mental Status: She is alert and oriented to person, place, and time.     Deep Tendon Reflexes: Strength normal. Reflexes normal.     Reflex Scores:      Tricep reflexes are 2+ on the right side and 2+ on the left side.      Bicep reflexes are 2+ on the right side and 2+ on the left side.      Brachioradialis reflexes are 2+ on the right side and 2+ on the left side.      Patellar reflexes are 2+ on the right side and 2+ on the left side. Psychiatric:        Mood and Affect: Mood and affect normal.        Speech: Speech normal.        Behavior: Behavior normal.  Thought Content: Thought content normal.     Assessment/Plan: Health Maintenance Due  Topic Date Due  . PAP SMEAR-Modifier  07/01/2019   Health Maintenance reviewed.  1. Preventative health care Encouraged her to try to fit exercise back into her  schedule.  She is juggling work and 2 children, but does feel better when she gets some regular exercise.  Keep up with healthy eating.  We can further discuss weight loss options pending blood work results.  2. Mastitis, left, acute Discussed likely okay to take 3 times a day and only for 3 to 5 days.  Warm compresses, frequent feeding/pumping.  Let me know if any worsening. - cephALEXin (KEFLEX) 500 MG capsule; Take 1 capsule (500 mg total) by mouth 4 (four) times daily for 7 days.  Dispense: 28 capsule; Refill: 0  3. Eczema, unspecified type Chronic eczema for which she does see dermatology but has not been in for appointment for a while.  History of using Protopic on this area.  Encouraged her to cover steroid with petroleum based agent to help better moisturize and heal area.  She will follow with dermatology. - betamethasone dipropionate 0.05 % cream; Apply topically 2 (two) times daily.  Dispense: 30 g; Refill: 2  4. Fatigue, unspecified type Start with blood work. - CBC with Differential/Platelet; Future - Comprehensive metabolic panel; Future - TSH; Future - T4, free; Future - IBC + Ferritin; Future - Thyroid antibodies; Future - Thyroid antibodies - IBC + Ferritin - T4, free - TSH - Comprehensive metabolic panel - CBC with Differential/Platelet  5. Lipid screening Lipid panel today  6. Screening for diabetes mellitus - Lipid panel; Future - Hemoglobin A1c; Future - Hemoglobin A1c - Lipid panel  Return in about 1 year (around 08/01/2021) for physical exam and sooner if needed.  Micheline Rough, MD

## 2020-08-01 NOTE — Patient Instructions (Signed)
Mastitis  Mastitis is inflammation of the breast tissue. It occurs most often in women who are breastfeeding, but it can also affect other women, and sometimes even men. What are the causes? This condition is usually caused by a bacterial infection. Bacteria enter the breast tissue through cuts or openings in the skin. Typically, this occurs with breastfeeding because of cracked or irritated nipples. Sometimes, it can occur when there is no opening in the skin. This is usually caused by plugged milk ducts. Other causes include:  Nipple piercing.  Some forms of breast cancer. What are the signs or symptoms? Symptoms of this condition include:  Swelling, redness, tenderness, and pain in an area of the breast. The area may also feel warm to the touch. These symptoms usually affect the upper part of the breast, toward the armpit region.  Swelling of the glands under the arm on the same side.  Fever.  Rapid pulse.  Fatigue, headache, and flu-like muscle aches. If an infection is allowed to progress, a collection of pus (abscess) may develop. How is this diagnosed? This condition can usually be diagnosed based on a physical exam and your symptoms. You may also have other tests, such as:  Blood tests to determine if your body is fighting a bacterial infection.  Mammogram or ultrasound tests to rule out other problems or diseases.  Testing of pus and other fluids. Pus from the breast may be collected and examined in the lab. If an abscess has developed, the fluid in the abscess can be removed with a needle. This test can be used to confirm the diagnosis and identify the bacteria present.  If you are breastfeeding, breast milk may be cultured and tested for bacteria. How is this treated? Treatment for this condition may include:  Applying heat or cold compresses to the affected area.  Medicine for pain.  Antibiotic medicine to treat a bacterial infection. This is usually taken by  mouth.  Self-care such as rest and increased fluid intake.  If an abscess has developed, it may be treated by removing fluid with a needle. Mastitis that occurs with breastfeeding will sometimes go away on its own, so your health care provider may choose to wait 24 hours after first seeing you to decide whether a prescription medicine is needed. You may be told of different ways to help manage breastfeeding, such as continuing to breastfeed or pump in order to ensure adequate milk flow. Follow these instructions at home: Medicines  Take over-the-counter and prescription medicines only as told by your health care provider.  If you were prescribed an antibiotic medicine, take it as told by your health care provider. Do not stop taking the antibiotic even if you start to feel better. General instructions  Do not wear a tight or underwire bra. Wear a soft, supportive bra.  Increase your fluid intake, especially if you have a fever.  Get plenty of rest. If you are breastfeeding:  Continue to empty your breasts as often as possible either by breastfeeding or by using a breast pump. This will decrease the pressure and the pain that comes with it. Ask your health care provider if changes need to be made to your breastfeeding or pumping routine.  Keep your nipples clean and dry.  During breastfeeding, empty the first breast completely before going to the other breast. If your baby is not emptying your breasts completely, use a breast pump to empty your breasts.  Use breast massage during feeding or pumping sessions.    If directed, apply moist heat to the affected area of your breast right before breastfeeding or pumping. Use the heat source that your health care provider recommends.  If directed, put ice on the affected area of your breast right after breastfeeding or pumping: ? Put ice in a plastic bag. ? Place a towel between your skin and the bag. ? Leave the ice on for 20 minutes.  If  you go back to work, pump your breasts while at work to stay within your nursing schedule.  Avoid allowing your breasts to become overly filled with milk (engorged). Contact a health care provider if:  You have pus-like discharge from the breast.  You have a fever.  Your symptoms do not improve within 2 days of starting treatment. Get help right away if:  Your pain and swelling are getting worse.  You have pain that is not controlled with medicine.  You have a red line extending from the breast toward your armpit. Summary  Mastitis is inflammation of the breast tissue. It occurs most often in women who are breastfeeding, but it can also affect non-breastfeeding women and some men.  This condition is usually caused by a bacterial infection.  This condition may be treated with hot and cold compresses, medicines, self-care, and certain breastfeeding strategies.  If you were prescribed an antibiotic medicine, take it as told by your health care provider. Do not stop taking the antibiotic even if you start to feel better. This information is not intended to replace advice given to you by your health care provider. Make sure you discuss any questions you have with your health care provider. Document Revised: 05/29/2017 Document Reviewed: 07/08/2016 Elsevier Patient Education  2021 [employer]vier Inc.  

## 2020-08-02 ENCOUNTER — Encounter: Payer: Self-pay | Admitting: Family Medicine

## 2020-08-02 LAB — THYROID ANTIBODIES
Thyroglobulin Ab: <1 IU/mL (ref <=1)
Thyroperoxidase Ab SerPl-aCnc: 3 IU/mL (ref <9)

## 2020-09-10 LAB — RESULTS CONSOLE HPV: CHL HPV: POSITIVE

## 2020-09-13 LAB — HM PAP SMEAR: HM Pap smear: POSITIVE

## 2020-10-08 DIAGNOSIS — F419 Anxiety disorder, unspecified: Secondary | ICD-10-CM | POA: Insufficient documentation

## 2020-11-02 DIAGNOSIS — F39 Unspecified mood [affective] disorder: Secondary | ICD-10-CM | POA: Insufficient documentation

## 2020-11-13 ENCOUNTER — Encounter: Payer: Self-pay | Admitting: Family Medicine

## 2020-11-14 ENCOUNTER — Encounter: Payer: Self-pay | Admitting: Family Medicine

## 2020-11-14 ENCOUNTER — Telehealth (INDEPENDENT_AMBULATORY_CARE_PROVIDER_SITE_OTHER): Payer: 59 | Admitting: Family Medicine

## 2020-11-14 VITALS — Temp 97.0°F | Wt 192.0 lb

## 2020-11-14 DIAGNOSIS — J019 Acute sinusitis, unspecified: Secondary | ICD-10-CM

## 2020-11-14 MED ORDER — CEPHALEXIN 500 MG PO CAPS: 500.0000 mg | ORAL_CAPSULE | Freq: Three times a day (TID) | ORAL | 0 refills | Status: AC

## 2020-11-14 NOTE — Progress Notes (Signed)
   Subjective:    Patient ID: Kelly Navarro, female    DOB: 1985-10-21, 35 y.o.   MRN: [id number]  HPI Virtual Visit via Telephone Note  I connected with the patient on 11/14/20 at  2:30 PM EDT by telephone and verified that I am speaking with the correct person using two identifiers.   I discussed the limitations, risks, security and privacy concerns of performing an evaluation and management service by telephone and the availability of in person appointments. I also discussed with the patient that there may be a patient responsible charge related to this service. The patient expressed understanding and agreed to proceed.  Location patient: home Location provider: work or home office Participants present for the call: patient, provider Patient did not have a visit in the prior 7 days to address this/these issue(s).   History of Present Illness: Here for one week of stuffy head, PND, blowing yellow mucus from the nose, and a dry cough. No fever or SOB or NVD. She tested negative for the Covid virus several days ago. Drinking fluids and taking Sudafed and Claritin. She is breast feeding.    Observations/Objective: Patient sounds cheerful and well on the phone. I do not appreciate any SOB. Speech and thought processing are grossly intact. Patient reported vitals:  Assessment and Plan: Sinusitis, treat with 10 days of Keflex. Recheck as needed.  Gershon Crane, MD   Follow Up Instructions:     [phone] 5-10 [phone] 11-20 [phone] 21-30 I did not refer this patient for an OV in the next 24 hours for this/these issue(s).  I discussed the assessment and treatment plan with the patient. The patient was provided an opportunity to ask questions and all were answered. The patient agreed with the plan and demonstrated an understanding of the instructions.   The patient was advised to call back or seek an in-person evaluation if the symptoms worsen or if the condition fails to improve as  anticipated.  I provided 15 minutes of non-face-to-face time during this encounter.   Gershon Crane, MD    Review of Systems     Objective:   Physical Exam        Assessment & Plan:

## 2021-06-05 ENCOUNTER — Ambulatory Visit (INDEPENDENT_AMBULATORY_CARE_PROVIDER_SITE_OTHER): Payer: 59 | Admitting: Family

## 2021-06-05 ENCOUNTER — Telehealth: Payer: Self-pay | Admitting: Family Medicine

## 2021-06-05 ENCOUNTER — Encounter: Payer: Self-pay | Admitting: Family

## 2021-06-05 ENCOUNTER — Other Ambulatory Visit: Payer: Self-pay

## 2021-06-05 VITALS — BP 125/81 | HR 115 | Temp 98.2°F | Ht 68.25 in | Wt 201.2 lb

## 2021-06-05 DIAGNOSIS — J101 Influenza due to other identified influenza virus with other respiratory manifestations: Secondary | ICD-10-CM | POA: Diagnosis not present

## 2021-06-05 LAB — POCT INFLUENZA A/B
Influenza A, POC: POSITIVE — AB
Influenza B, POC: NEGATIVE

## 2021-06-05 LAB — POC COVID19 BINAXNOW: SARS Coronavirus 2 Ag: NEGATIVE

## 2021-06-05 MED ORDER — OSELTAMIVIR PHOSPHATE 75 MG PO CAPS: 75.0000 mg | ORAL_CAPSULE | Freq: Two times a day (BID) | ORAL | 0 refills | Status: DC

## 2021-06-05 NOTE — Patient Instructions (Addendum)
It was very nice to see you today!  You are positive for Influenza A - I have sent Tamiflu to your pharmacy, start this today. OK to continue tylenol or Ibuprofen 3-4 times per day for aches/pain/fever, and OTC generic Sudafed or Claritin D for sinus symptoms.    PLEASE NOTE:  If you had any lab tests please let us know if you have not heard back within a few days. You may see your results on MyChart before we have a chance to review them but we will give you a call once they are reviewed by [place]. If we ordered any referrals today, please let us know if you have not heard from their office within the next week.   Please try these tips to maintain a healthy lifestyle:  Eat most of your calories during the day when you are active. Eliminate processed foods including packaged sweets (pies, cakes, cookies), reduce intake of potatoes, white bread, white pasta, and white rice. Look for whole grain options, oat flour or almond flour.  Each meal should contain half fruits/vegetables, one quarter protein, and one quarter carbs (no bigger than a computer mouse).  Cut down on sweet beverages. This includes juice, soda, and sweet tea. Also watch fruit intake, though this is a healthier sweet option, it still contains natural sugar! Limit to 3 servings daily.  Drink at least 1 glass of water with each meal and aim for at least 8 glasses per day  Exercise at least 150 minutes every week.

## 2021-06-05 NOTE — Telephone Encounter (Addendum)
Patient calling in with respiratory symptoms: Shortness of breath, chest pain, palpitations or other red words send to Triage  Does the patient have a fever over 100, cough, congestion, sore throat, runny nose, lost of taste/smell within the last 5 days (please list symptoms that patient has)?temp 100.9 last night, cough throat and head congestion x 2 days Have you tested for Covid in the last 5 days? yes  If yes, was it positive []  OR negative [x] ? If positive in the last 5 days, please schedule virtual visit now. If negative, schedule for an in person OV with the next available provider if PCP has no openings. Please also let patient know they will be tested again (follow the script below)  "you will have to arrive prior to your appt time to be Covid tested. Please park in back of office at the [facility] & call [phone] to let the staff know you have arrived. A staff member will meet you at your car to do a rapid covid test. Once the test has resulted you will be notified by phone of your results to determine if appt will remain an in person visit or be converted to a virtual/phone visit. If you arrive less than before your appt time, your visit will be automatically converted to virtual & any recommended testing will happen AFTER the visit."  Pt will see PA stephanie at [place] location today 06-05-2021 at 10 am THINGS TO REMEMBER  If no availability for virtual visit in office,  please schedule another [place] office  If no availability at another [place] office, please instruct patient that they can schedule an evisit or virtual visit through their mychart account. Visits up to 8pm  patients can be seen in office 5 days after positive COVID test

## 2021-06-05 NOTE — Progress Notes (Signed)
Subjective:     Patient ID: Kelly Navarro, female    DOB: 1985/07/20, 35 y.o.   MRN: [id number]  Chief Complaint  Patient presents with   Sore Throat    Symtpoms started last night, she has taken an OTC ibuprofen, tylenol, and a decongestant. Symptoms have not subsided. Covid test was done last night, with negative results.    Nasal Congestion   Fever    Pt reported temp of 100.8 last night.   Chills   Cough    HPI: Upper Respiratory Infection: Symptoms include congestion, low grade fever, nasal congestion, non productive cough, sinus pressure, sore throat, and chills .  Onset of symptoms was 1 day ago, gradually worsening since that time. She is drinking moderate amounts of fluids. Evaluation to date: none.  Treatment to date: none.    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   Eczema    Gaucher disease, type I (HCC)    noted on [facility] genetic testing 05/2015   GERD (gastroesophageal reflux disease)    Headache(784.0)    menstrual    IBS (irritable bowel syndrome)    Incomplete miscarriage 02/22/2019   Scoliosis    very mild    Past Surgical History:  Procedure Laterality Date   CESAREAN SECTION N/A 10/21/2016   Procedure: CESAREAN SECTION;  Surgeon: Mitchel Honour, DO;  Location: [facility];  Service: Obstetrics;  Laterality: N/A;   CESAREAN SECTION N/A 03/22/2020   Procedure: CESAREAN SECTION;  Surgeon: Noland Fordyce, MD;  Location: [facility];  Service: Obstetrics;  Laterality: N/A;   DILATION AND EVACUATION N/A 02/22/2019   Procedure:  DILATATION AND EVACUATION;  Surgeon: Marcelle Overlie, MD;  Location: [facility];  Service: Gynecology;  Laterality: N/A;   GUM SURGERY     OTHER SURGICAL HISTORY  2020   septate uterus, polyp   WISDOM TOOTH EXTRACTION      Outpatient Medications Prior to Visit  Medication Sig Dispense Refill   betamethasone dipropionate 0.05 % cream Apply topically 2 (two) times daily. 30 g 2   FLUoxetine (PROZAC) 20 MG tablet Take 20 mg by mouth daily.     ibuprofen (ADVIL) 800 MG tablet Take 1 tablet (800 mg total) by mouth every 8 (eight) hours. 30 tablet 0   Prenatal Vit-Fe Fumarate-FA (PRENATAL MULTIVITAMIN) TABS tablet Take 1 tablet by mouth daily at 12 noon.     cetirizine (ZYRTEC) 10 MG tablet Take 10 mg by mouth daily. (Patient not taking: Reported on 06/05/2021)     No facility-administered medications prior to visit.    Allergies  Allergen Reactions   Amoxicillin Hives    Has patient had a PCN reaction causing immediate rash, facial/tongue/throat swelling, SOB or lightheadedness with hypotension: Yes Has patient had a PCN reaction causing severe rash involving mucus membranes or skin necrosis: No Has patient had a PCN reaction that required hospitalization No Has patient had a PCN reaction occurring within the last 10 years: No If all of the above answers are "NO", then may proceed with Cephalosporin use.   Bee Venom Other (  See Comments)    Tongue swelling   Macrodantin [Nitrofurantoin Macrocrystal] Nausea And Vomiting   Penicillins Hives    Has patient had a PCN reaction causing immediate rash, facial/tongue/throat swelling, SOB or lightheadedness with hypotension: Yes Has patient had a PCN reaction causing severe rash involving mucus membranes or skin necrosis: No Has patient had a PCN reaction that required hospitalization No Has patient had a PCN reaction occurring within the last 10 years: No If all of the above answers are "NO", then may proceed with Cephalosporin use.   Pineapple      Itchy throat   Sulfa Antibiotics Hives        Objective:    Physical Exam Vitals and nursing note reviewed.  Constitutional:      Appearance: Normal appearance.  HENT:     Right Ear: Tympanic membrane and ear canal normal.     Left Ear: Tympanic membrane and ear canal normal.     Nose:     Right Sinus: Frontal sinus tenderness present.     Left Sinus: Frontal sinus tenderness present.     Mouth/Throat:     Mouth: Mucous membranes are moist.     Pharynx: Posterior oropharyngeal erythema (mild) present. No pharyngeal swelling or oropharyngeal exudate.     Tonsils: No tonsillar exudate or tonsillar abscesses.  Cardiovascular:     Rate and Rhythm: Normal rate and regular rhythm.  Pulmonary:     Effort: Pulmonary effort is normal.     Breath sounds: Normal breath sounds.  Musculoskeletal:        General: Normal range of motion.  Skin:    General: Skin is warm and dry.  Neurological:     Mental Status: She is alert.  Psychiatric:        Mood and Affect: Mood normal.        Behavior: Behavior normal.    BP 125/81   Pulse (!) 115   Temp 98.2 F (36.8 C) (Temporal)   Ht 5' 8.25" (1.734 m)   Wt 201 lb 3.2 oz (91.3 kg)   SpO2 98%   BMI 30.37 kg/m  Wt Readings from Last 3 Encounters:  06/05/21 201 lb 3.2 oz (91.3 kg)  11/14/20 192 lb (87.1 kg)  08/01/20 192 lb 4.8 oz (87.2 kg)       Assessment & Plan:   Problem List Items Addressed This Visit       Respiratory   Influenza A (H1N1) - Primary   Relevant Medications   oseltamivir (TAMIFLU) 75 MG capsule   Other Relevant Orders   POCT Influenza A/B (Completed)   POC COVID-19 (Completed)    Meds ordered this encounter  Medications   oseltamivir (TAMIFLU) 75 MG capsule    Sig: Take 1 capsule (75 mg total) by mouth 2 (two) times daily.    Dispense:  10 capsule    Refill:  0    Order Specific Question:   Supervising Provider    Answer:   ANDY, CAMILLE L [[id number]]

## 2021-06-17 ENCOUNTER — Encounter: Payer: Self-pay | Admitting: Family

## 2021-07-08 ENCOUNTER — Encounter: Payer: Self-pay | Admitting: Family Medicine

## 2021-07-08 MED ORDER — ALPRAZOLAM 0.5 MG PO TABS: 0.2500 mg | ORAL_TABLET | Freq: Every evening | ORAL | 0 refills | Status: DC | PRN

## 2021-07-08 NOTE — Telephone Encounter (Signed)
Spoke with patient. She was already feeling that mood was not well controlled, but then over weekend husband asked her about separating. They are in couples therapy and she has individual counselor. She has been on 20mg  prozac per obgyn. She had some old xanax left over which she took over weekend to help with sleep (generally sleep is not an issue). She has buspar as well which she hasn't taken from obgyn.   She prefers to avoid increasing SSRI due to worries about weight gain. She will start buspar 7.5mg  and if tolerating increase to 15mg  BID after a week. She has appt on 1/20 with me. I did send in xanax for her to use for back up for sleep/anxiety in meanwhile as she is under a lot of stress now.   She is comfortable with this plan and knows to reach out if additional concerns/problems before appointment.

## 2021-07-11 DIAGNOSIS — F4310 Post-traumatic stress disorder, unspecified: Secondary | ICD-10-CM | POA: Diagnosis not present

## 2021-07-11 DIAGNOSIS — F4323 Adjustment disorder with mixed anxiety and depressed mood: Secondary | ICD-10-CM | POA: Diagnosis not present

## 2021-07-18 DIAGNOSIS — F4323 Adjustment disorder with mixed anxiety and depressed mood: Secondary | ICD-10-CM | POA: Diagnosis not present

## 2021-07-18 DIAGNOSIS — F4310 Post-traumatic stress disorder, unspecified: Secondary | ICD-10-CM | POA: Diagnosis not present

## 2021-07-19 ENCOUNTER — Encounter: Payer: Self-pay | Admitting: Family Medicine

## 2021-07-19 ENCOUNTER — Ambulatory Visit: Payer: BC Managed Care – PPO | Admitting: Family Medicine

## 2021-07-19 VITALS — BP 94/62 | HR 86 | Temp 97.7°F | Ht 68.25 in | Wt 202.2 lb

## 2021-07-19 DIAGNOSIS — F419 Anxiety disorder, unspecified: Secondary | ICD-10-CM

## 2021-07-19 DIAGNOSIS — R5383 Other fatigue: Secondary | ICD-10-CM | POA: Diagnosis not present

## 2021-07-19 DIAGNOSIS — L309 Dermatitis, unspecified: Secondary | ICD-10-CM

## 2021-07-19 DIAGNOSIS — Z131 Encounter for screening for diabetes mellitus: Secondary | ICD-10-CM

## 2021-07-19 DIAGNOSIS — G4700 Insomnia, unspecified: Secondary | ICD-10-CM

## 2021-07-19 DIAGNOSIS — E669 Obesity, unspecified: Secondary | ICD-10-CM

## 2021-07-19 DIAGNOSIS — Z13220 Encounter for screening for lipoid disorders: Secondary | ICD-10-CM | POA: Diagnosis not present

## 2021-07-19 DIAGNOSIS — Z6830 Body mass index (BMI) 30.0-30.9, adult: Secondary | ICD-10-CM

## 2021-07-19 LAB — LIPID PANEL
Cholesterol: 102 mg/dL (ref 0–200)
HDL: 36.9 mg/dL — ABNORMAL LOW (ref >39.00)
LDL Cholesterol: 28 mg/dL (ref 0–99)
NonHDL: 65.06
Total CHOL/HDL Ratio: 3
Triglycerides: 183 mg/dL — ABNORMAL HIGH (ref 0.0–149.0)
VLDL: 36.6 mg/dL (ref 0.0–40.0)

## 2021-07-19 LAB — CBC WITH DIFFERENTIAL/PLATELET
Basophils Absolute: 0 10*3/uL (ref 0.0–0.1)
Basophils Relative: 0.6 % (ref 0.0–3.0)
Eosinophils Absolute: 0.1 10*3/uL (ref 0.0–0.7)
Eosinophils Relative: 1.7 % (ref 0.0–5.0)
HCT: 36.1 % (ref 36.0–46.0)
Hemoglobin: 11.8 g/dL — ABNORMAL LOW (ref 12.0–15.0)
Lymphocytes Relative: 28.7 % (ref 12.0–46.0)
Lymphs Abs: 1.3 10*3/uL (ref 0.7–4.0)
MCHC: 32.6 g/dL (ref 30.0–36.0)
MCV: 87.7 fl (ref 78.0–100.0)
Monocytes Absolute: 0.3 10*3/uL (ref 0.1–1.0)
Monocytes Relative: 7.3 % (ref 3.0–12.0)
Neutro Abs: 2.8 10*3/uL (ref 1.4–7.7)
Neutrophils Relative %: 61.7 % (ref 43.0–77.0)
Platelets: 235 10*3/uL (ref 150.0–400.0)
RBC: 4.12 Mil/uL (ref 3.87–5.11)
RDW: 13.7 % (ref 11.5–15.5)
WBC: 4.5 10*3/uL (ref 4.0–10.5)

## 2021-07-19 LAB — VITAMIN B12: Vitamin B-12: 346 pg/mL (ref 211–911)

## 2021-07-19 LAB — COMPREHENSIVE METABOLIC PANEL
ALT: 13 U/L (ref 0–35)
AST: 16 U/L (ref 0–37)
Albumin: 4.3 g/dL (ref 3.5–5.2)
Alkaline Phosphatase: 68 U/L (ref 39–117)
BUN: 9 mg/dL (ref 6–23)
CO2: 29 mEq/L (ref 19–32)
Calcium: 9.3 mg/dL (ref 8.4–10.5)
Chloride: 104 mEq/L (ref 96–112)
Creatinine, Ser: 0.63 mg/dL (ref 0.40–1.20)
GFR: 114.49 mL/min (ref >60.00)
Glucose, Bld: 71 mg/dL (ref 70–99)
Potassium: 4.3 mEq/L (ref 3.5–5.1)
Sodium: 137 mEq/L (ref 135–145)
Total Bilirubin: 0.3 mg/dL (ref 0.2–1.2)
Total Protein: 7.8 g/dL (ref 6.0–8.3)

## 2021-07-19 LAB — TSH: TSH: 0.78 u[IU]/mL (ref 0.35–5.50)

## 2021-07-19 LAB — HEMOGLOBIN A1C: Hgb A1c MFr Bld: 5.3 % (ref 4.6–6.5)

## 2021-07-19 LAB — VITAMIN D 25 HYDROXY (VIT D DEFICIENCY, FRACTURES): VITD: 29.61 ng/mL — ABNORMAL LOW (ref 30.00–100.00)

## 2021-07-19 MED ORDER — HYDROXYZINE HCL 25 MG PO TABS: 25.0000 mg | ORAL_TABLET | Freq: Three times a day (TID) | ORAL | 2 refills | Status: DC | PRN

## 2021-07-19 MED ORDER — BETAMETHASONE DIPROPIONATE 0.05 % EX CREA: TOPICAL_CREAM | Freq: Two times a day (BID) | CUTANEOUS | 2 refills | Status: DC

## 2021-07-19 MED ORDER — BUSPIRONE HCL 7.5 MG PO TABS: ORAL_TABLET | ORAL | 0 refills | Status: DC

## 2021-07-19 NOTE — Progress Notes (Signed)
Kelly Navarro DOB: 1985-11-15 Encounter date: 07/19/2021  This is a 36 y.o. female who presents with Chief Complaint  Patient presents with   Follow-up    History of present illness:  Patient and I spoke about a week and a half ago.  At that time she was already having some increased anxiety and mood difficulties and then her husband brought up the idea of separation.  Since then she has had more difficulty with stress, anxiety, sleep.  When we last spoke, I encouraged her to go ahead and start the BuSpar 7.5 mg twice daily that was prescribed in the past by her gynecologist.  She was continuing with the Prozac.  She has been seeing her therapist. Saw her last week and yesterday. Hard for her to remember to take second buspar in the day. Has taken xanax (or half) majority of nights due to anxiety about not being able to sleep.   Frustrated with lack of weight loss. Didn't gain as much this pregnancy as she did with last pregnancy.   Has signed up for work out classes [employer] training. Sort of boot camp classes.    Allergies  Allergen Reactions   Amoxicillin Hives    Has patient had a PCN reaction causing immediate rash, facial/tongue/throat swelling, SOB or lightheadedness with hypotension: Yes Has patient had a PCN reaction causing severe rash involving mucus membranes or skin necrosis: No Has patient had a PCN reaction that required hospitalization No Has patient had a PCN reaction occurring within the last 10 years: No If all of the above answers are "NO", then may proceed with Cephalosporin use.   Bee Venom Other (See Comments)    Tongue swelling   Macrodantin [Nitrofurantoin Macrocrystal] Nausea And Vomiting   Penicillins Hives    Has patient had a PCN reaction causing immediate rash, facial/tongue/throat swelling, SOB or lightheadedness with hypotension: Yes Has patient had a PCN reaction causing severe rash involving mucus membranes or skin necrosis: No Has patient had a  PCN reaction that required hospitalization No Has patient had a PCN reaction occurring within the last 10 years: No If all of the above answers are "NO", then may proceed with Cephalosporin use.   Pineapple     Itchy throat   Sulfa Antibiotics Hives   Current Meds  Medication Sig   ALPRAZolam (XANAX) 0.5 MG tablet Take 0.5-1 tablets (0.25-0.5 mg total) by mouth at bedtime as needed for anxiety.   betamethasone dipropionate 0.05 % cream Apply topically 2 (two) times daily.   FLUoxetine (PROZAC) 20 MG tablet Take 20 mg by mouth daily.   ibuprofen (ADVIL) 800 MG tablet Take 1 tablet (800 mg total) by mouth every 8 (eight) hours.   Prenatal Vit-Fe Fumarate-FA (PRENATAL MULTIVITAMIN) TABS tablet Take 1 tablet by mouth daily at 12 noon.    Review of Systems  Constitutional:  Negative for chills, fatigue and fever.  Respiratory:  Negative for cough, chest tightness, shortness of breath and wheezing.   Cardiovascular:  Negative for chest pain, palpitations and leg swelling.  Skin:        Skin is very dry.  She has done well in the past with steroid cream that was given for eczema flares.  Additionally, she tends to break out and get very itchy every time she shaves.  Wondering what she can do to help with this.  Psychiatric/Behavioral:  Positive for sleep disturbance. The patient is nervous/anxious.    Objective:  BP 94/62 (BP Location: Left Arm, Patient  Position: Sitting, Cuff Size: Large)    Pulse 86    Temp 97.7 F (36.5 C) (Oral)    Ht 5' 8.25" (1.734 m)    Wt 202 lb 3.2 oz (91.7 kg)    LMP 07/16/2021 (Exact Date)    SpO2 99%    BMI 30.52 kg/m   Weight: 202 lb 3.2 oz (91.7 kg)   BP Readings from Last 3 Encounters:  07/19/21 94/62  06/05/21 125/81  08/01/20 (!) 84/60   Wt Readings from Last 3 Encounters:  07/19/21 202 lb 3.2 oz (91.7 kg)  06/05/21 201 lb 3.2 oz (91.3 kg)  11/14/20 192 lb (87.1 kg)    Physical Exam Constitutional:      General: She is not in acute distress.     Appearance: She is well-developed.  Cardiovascular:     Rate and Rhythm: Normal rate and regular rhythm.     Heart sounds: Normal heart sounds. No murmur heard.   No friction rub.  Pulmonary:     Effort: Pulmonary effort is normal. No respiratory distress.     Breath sounds: Normal breath sounds. No wheezing or rales.  Musculoskeletal:     Right lower leg: No edema.     Left lower leg: No edema.  Neurological:     Mental Status: She is alert and oriented to person, place, and time.  Psychiatric:        Behavior: Behavior normal.    Assessment/Plan 1. Anxiety She has a lot on her plate currently, but is working regularly with a therapist and has a good support system.  Encouraged her to take 50 mg twice daily of the BuSpar to see if this helps a little more with anxiety.  We will continue her Prozac.  Trial of hydroxyzine (rather than Xanax).  Xanax does work for her, but we are in agreement that she does not want to continue with a controlled substance to help with anxiety if we are able to find something else that will help as needed.  Sleep is certainly an issue due to all of the acute stressors going on with potential separation looming. - hydrOXYzine (ATARAX) 25 MG tablet; Take 1 tablet (25 mg total) by mouth 3 (three) times daily as needed for anxiety.  Dispense: 90 tablet; Refill: 2  2. Insomnia, unspecified type - hydrOXYzine (ATARAX) 25 MG tablet; Take 1 tablet (25 mg total) by mouth 3 (three) times daily as needed for anxiety.  Dispense: 90 tablet; Refill: 2  3. Class 1 obesity without serious comorbidity with body mass index (BMI) of 30.0 to 30.9 in adult, unspecified obesity type She is interested in help with weight loss.  She is still breast-feeding, plans to stop in the next month or so.  We will start with blood work today, but we did discuss category of GLP-1's for weight loss for consideration in future.  4. Eczema, unspecified type Generally well controlled with  moisture and routine self-care.  Sparingly will use betamethasone for worse flares. - betamethasone dipropionate 0.05 % cream; Apply topically 2 (two) times daily.  Dispense: 30 g; Refill: 2  5. Fatigue, unspecified type - CBC with Differential/Platelet; Future - Comprehensive metabolic panel; Future - TSH; Future - VITAMIN D 25 Hydroxy (Vit-D Deficiency, Fractures); Future - Vitamin B12; Future  6. Lipid screening - Lipid panel; Future  7. Screening for diabetes mellitus - Hemoglobin A1c; Future   Return in about 3 months (around 10/17/2021). 42 minutes spent in chart review, time with patient,  follow-up treatment plan, discussion of weight loss medication treatment options and adjustments for mood treatment, exam, charting.   Theodis ShoveJunell Jencarlos Nicolson, MD

## 2021-07-19 NOTE — Patient Instructions (Addendum)
Increase the buspar to 15mg  twice daily (morning and afternoon doses). Just let me know how this does as you are nearing the end of your prescription.

## 2021-07-25 DIAGNOSIS — F4310 Post-traumatic stress disorder, unspecified: Secondary | ICD-10-CM | POA: Diagnosis not present

## 2021-07-25 DIAGNOSIS — F4323 Adjustment disorder with mixed anxiety and depressed mood: Secondary | ICD-10-CM | POA: Diagnosis not present

## 2021-07-26 ENCOUNTER — Encounter: Payer: Self-pay | Admitting: Family Medicine

## 2021-08-01 ENCOUNTER — Encounter: Payer: Self-pay | Admitting: Family Medicine

## 2021-08-02 DIAGNOSIS — F4323 Adjustment disorder with mixed anxiety and depressed mood: Secondary | ICD-10-CM | POA: Diagnosis not present

## 2021-08-02 DIAGNOSIS — F4310 Post-traumatic stress disorder, unspecified: Secondary | ICD-10-CM | POA: Diagnosis not present

## 2021-08-08 DIAGNOSIS — F4323 Adjustment disorder with mixed anxiety and depressed mood: Secondary | ICD-10-CM | POA: Diagnosis not present

## 2021-08-08 DIAGNOSIS — F4310 Post-traumatic stress disorder, unspecified: Secondary | ICD-10-CM | POA: Diagnosis not present

## 2021-08-11 ENCOUNTER — Encounter: Payer: Self-pay | Admitting: Family Medicine

## 2021-08-12 ENCOUNTER — Telehealth: Payer: Self-pay | Admitting: Family Medicine

## 2021-08-12 ENCOUNTER — Telehealth (INDEPENDENT_AMBULATORY_CARE_PROVIDER_SITE_OTHER): Payer: BC Managed Care – PPO | Admitting: Family Medicine

## 2021-08-12 ENCOUNTER — Encounter: Payer: Self-pay | Admitting: Family Medicine

## 2021-08-12 VITALS — Ht 68.25 in

## 2021-08-12 DIAGNOSIS — R059 Cough, unspecified: Secondary | ICD-10-CM | POA: Diagnosis not present

## 2021-08-12 DIAGNOSIS — J069 Acute upper respiratory infection, unspecified: Secondary | ICD-10-CM | POA: Diagnosis not present

## 2021-08-12 DIAGNOSIS — R0981 Nasal congestion: Secondary | ICD-10-CM | POA: Diagnosis not present

## 2021-08-12 MED ORDER — ALBUTEROL SULFATE HFA 108 (90 BASE) MCG/ACT IN AERS: 2.0000 | INHALATION_SPRAY | Freq: Four times a day (QID) | RESPIRATORY_TRACT | 0 refills | Status: DC | PRN

## 2021-08-12 MED ORDER — BENZONATATE 100 MG PO CAPS: 100.0000 mg | ORAL_CAPSULE | Freq: Two times a day (BID) | ORAL | 0 refills | Status: AC | PRN

## 2021-08-12 MED ORDER — HYDROCODONE BIT-HOMATROP MBR 5-1.5 MG/5ML PO SOLN: 5.0000 mL | Freq: Two times a day (BID) | ORAL | 0 refills | Status: AC | PRN

## 2021-08-12 MED ORDER — PREDNISONE 20 MG PO TABS: 40.0000 mg | ORAL_TABLET | Freq: Every day | ORAL | 0 refills | Status: AC

## 2021-08-12 MED ORDER — DOXYCYCLINE HYCLATE 100 MG PO TABS: 100.0000 mg | ORAL_TABLET | Freq: Two times a day (BID) | ORAL | 0 refills | Status: AC

## 2021-08-12 NOTE — Telephone Encounter (Signed)
Patient calling in with respiratory symptoms: Shortness of breath, chest pain, palpitations or other red words send to Triage  Does the patient have a fever over 100, cough, congestion, sore throat, runny nose, lost of taste/smell (please list symptoms that patient has)?congestion  What date did symptoms start?3 wk's ago (If over 5 days ago, pt may be scheduled for in person visit) Have you tested for Covid in the last 5 days? No   If yes, was it positive []  OR negative [] ? If positive in the last 5 days, please schedule virtual visit now. If negative, schedule for an in person OV with the next available provider if PCP has no openings. Please also let patient know they will be tested again (follow the script below)  "you will have to arrive prior to your appt time to be Covid tested. Please park in back of office at the cone & call [phone] to let the staff know you have arrived. A staff member will meet you at your car to do a rapid covid test. Once the test has resulted you will be notified by phone of your results to determine if appt will remain an in person visit or be converted to a virtual/phone visit. If you arrive less than before your appt time, your visit will be automatically converted to virtual & any recommended testing will happen AFTER the visit."  Pt have a virtual with dr.Jordan today at 4:30 THINGS TO REMEMBER  If no availability for virtual visit in office,  please schedule another [place] office  If no availability at another  office, please instruct patient that they can schedule an evisit or virtual visit through their mychart account. Visits up to 8pm  patients can be seen in office 5 days after positive COVID test

## 2021-08-12 NOTE — Progress Notes (Signed)
Virtual Visit via Video Note I connected with Kelly Navarro on 08/12/21 by a video enabled telemedicine application and verified that I am speaking with the correct person using two identifiers.  Location patient: home Location provider:work office Persons participating in the virtual visit: patient, provider  I discussed the limitations of evaluation and management by telemedicine and the availability of in person appointments. The patient expressed understanding and agreed to proceed.  Chief Complaint  Patient presents with   Sinusitis    Ongoing x 2 weeks.   HPI: Kelly Navarro is a 36 yo female with history of GERD, eczema,anxiety, and IBS complaining of 7-10 days of respiratory symptoms. She had a cold 3 weeks ago, symptoms resolved and 7-10 days ago she started with rhinorrhea, nasal congestion, productive cough with greenish sputum, and dysphonia.  Sinus Problem The current episode started 1 to 4 weeks ago. The problem has been gradually worsening since onset. There has been no fever. The pain is mild. Associated symptoms include congestion, coughing, ear pain, a hoarse voice, sinus pressure and a sore throat. Pertinent negatives include no chills, headaches, neck pain, shortness of breath, sneezing or swollen glands. Past treatments include oral decongestants. The treatment provided mild relief.  Negative for hemoptysis. Cough seems to be worse at night and exacerbated by talking. She has not noted wheezing. No history of allergies,asthma, or tobacco use.  Dysphonia for a couple days. Sore throat and left ear ache today. She is not sure if earache is her throat or ear, radiates from left side of her throat. Negative for hearing changes or ear drainage.  Left submandibular gland tenderness with palpation. Negative for abdominal pain, nausea, vomiting, changes in bowel habits, body aches, urinary symptoms, or skin rash.  No known sick contact. She has taken Delsym, ibuprofen,  and Sudafed.  ROS: See pertinent positives and negatives per HPI.  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   Eczema    Gaucher disease, type I ([place])    noted on [facility] genetic testing 05/2015   GERD (gastroesophageal reflux disease)    Headache(784.0)    menstrual    IBS (irritable bowel syndrome)    Incomplete miscarriage 02/22/2019   Scoliosis    very mild    Past Surgical History:  Procedure Laterality Date   CESAREAN SECTION N/A 10/21/2016   Procedure: CESAREAN SECTION;  Surgeon: Linda Hedges, DO;  Location: [facility];  Service: Obstetrics;  Laterality: N/A;   CESAREAN SECTION N/A 03/22/2020   Procedure: CESAREAN SECTION;  Surgeon: Aloha Gell, MD;  Location: [facility];  Service: Obstetrics;  Laterality: N/A;   DILATION AND EVACUATION N/A 02/22/2019   Procedure: DILATATION AND EVACUATION;  Surgeon: Dian Queen, MD;  Location: [facility];  Service: Gynecology;  Laterality: N/A;   GUM SURGERY     OTHER SURGICAL HISTORY  2020   septate uterus, polyp   WISDOM TOOTH EXTRACTION      Family History  Problem Relation Age of Onset   Hypertension Mother    Non-Hodgkin's lymphoma Mother    Healthy Father    Asthma Sister    Eczema Sister    Diabetes Maternal Grandfather    Hypertension Maternal Grandfather    Heart disease Maternal Grandfather  Breast cancer Paternal Grandmother        Age 38   Cancer Paternal Grandfather        multiple myeloma    Social History   Socioeconomic History   Marital status: Married    Spouse name: Not on file   Number of children: Not on file   Years of education: Not on file   Highest education level: Master's degree (e.g., MA, MS, MEng, MEd, MSW,  MBA)  Occupational History   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   1 dog.   Social Determinants of Health   Financial Resource Strain: Low Risk    Difficulty of Paying Living Expenses: Not hard at all  Food Insecurity: No Food Insecurity   Worried About Charity fundraiser in the Last Year: Never true   [place] in the Last Year: Never true  Transportation Needs: No Transportation Needs   Lack of Transportation (Medical): No   Lack of Transportation (Non-Medical): No  Physical Activity: Insufficiently Active   Days of Exercise per Week: 3 days   Minutes of Exercise per Session: 40 min  Stress: Stress Concern Present   Feeling of Stress : Very much  Social Connections: Socially Integrated   Frequency of Communication with Friends and Family: More than three times a week   Frequency of Social Gatherings with Friends and Family: Twice a week   Attends Religious Services: More than 4 times per year   Active Member of [employer] or Organizations: Yes   Attends Music therapist: More than 4 times per year   Marital Status: Married  Human resources officer Violence: Not on file   Current Outpatient Medications:    ALPRAZolam (XANAX) 0.5 MG tablet, Take 0.5-1 tablets (0.25-0.5 mg total) by mouth at bedtime as needed for anxiety., Disp: 20 tablet, Rfl: 0   betamethasone dipropionate 0.05 % cream, Apply topically 2 (two) times daily.,  Disp: 30 g, Rfl: 2   busPIRone (BUSPAR) 7.5 MG tablet, 77m BID; prescribed last from gyn, Disp: 60 tablet, Rfl: 0   FLUoxetine (PROZAC) 20 MG tablet, Take 20 mg by mouth daily., Disp: , Rfl:    hydrOXYzine (ATARAX) 25 MG tablet, Take 1 tablet (25 mg total) by mouth 3 (three) times daily as needed for anxiety., Disp: 90 tablet, Rfl: 2   ibuprofen (ADVIL) 800 MG tablet, Take 1 tablet (800 mg total) by mouth every 8 (eight) hours., Disp: 30 tablet, Rfl: 0   Prenatal Vit-Fe Fumarate-FA (PRENATAL MULTIVITAMIN) TABS tablet, Take 1 tablet by mouth daily at 12 noon., Disp: , Rfl:   EXAM:  VITALS per patient if applicable:Ht 5' 82.70 (1.734 m)    LMP 07/16/2021 (Within Weeks)    Breastfeeding No Comment: Stopped last week.   BMI 30.52 kg/m   GENERAL: alert, oriented, appears well and in no acute distress  HEENT: atraumatic, conjunctiva clear, no obvious abnormalities on inspection of external nose and ears. Uvula is center, no edema. I do not appreciate erythema or exudate on inspection through video. Mild tenderness when she applies pressure around left maxillary sinus area.  NECK: normal movements of the head and neck Tenderness of left submandibular gland when she palpates area, no masses or enlarged glands.  LUNGS: on inspection no signs of respiratory distress, breathing rate appears normal, no obvious gross SOB, gasping or wheezing. Prolonged expiration and cough with forced expiration.  CV: no obvious cyanosis  MS: moves all visible extremities without noticeable abnormality  PSYCH/NEURO: pleasant and cooperative, no obvious depression or anxiety, speech and thought processing grossly intact  ASSESSMENT AND PLAN:  Discussed the following assessment and plan:  Sinus congestion - Plan: predniSONE (DELTASONE) 20 MG tablet I do not think antibiotic treatment is recommended at this time.She has several abx allergies. Short course of prednisone may help. Nasal saline irrigations as  needed. Continue OTC decongestant. If in 3 to 4 days problem is not any better, she can start doxycycline 100 mg twice daily x7 days. F/U with PCP if symptoms have not resolved in 2-3 weeks.  URI, acute Symptoms suggest viral etiology, explained that I do not think antibiotic treatment is needed at this time. Symptomatic treatment recommended. Throat lozenges for sore throat. Adequate hydration. Monitor for signs of complications, including new onset of fever.  Cough, unspecified type  Explained that cough and congestion can last a few more days and even weeks after acute symptoms have resolved.  Symptomatic treatment with benzonatate during the day and Hycodan at night recommended. Question of reactive airway disease no asthma, recommend Albuterol inh 2 puff every 6 hours for a week then as needed for wheezing or shortness of breath.  And short course of prednisone , 40 mg x 3 days. Instructed to take medication with breakfast, some side effects discussed.  - Plan: benzonatate (TESSALON) 100 MG capsule, HYDROcodone bit-homatropine (HYCODAN) 5-1.5 MG/5ML syrup  We discussed possible serious and likely etiologies, options for evaluation and workup, limitations of telemedicine visit vs in person visit, treatment, treatment risks and precautions. The patient was advised to call back or seek an in-person evaluation if the symptoms worsen or if the condition fails to improve as anticipated. I discussed the assessment and treatment plan with the patient. The patient was provided an opportunity to ask questions and all were answered. The patient agreed with the plan and demonstrated an understanding of the instructions.  Return in about 3 weeks (around 09/02/2021), or if symptoms worsen or fail to improve. Bhavya Eschete G. [place], MD  [facility]. [place] office.

## 2021-08-15 DIAGNOSIS — F4323 Adjustment disorder with mixed anxiety and depressed mood: Secondary | ICD-10-CM | POA: Diagnosis not present

## 2021-08-15 DIAGNOSIS — F4310 Post-traumatic stress disorder, unspecified: Secondary | ICD-10-CM | POA: Diagnosis not present

## 2021-08-22 DIAGNOSIS — F4310 Post-traumatic stress disorder, unspecified: Secondary | ICD-10-CM | POA: Diagnosis not present

## 2021-08-22 DIAGNOSIS — F4323 Adjustment disorder with mixed anxiety and depressed mood: Secondary | ICD-10-CM | POA: Diagnosis not present

## 2021-08-23 DIAGNOSIS — S93602A Unspecified sprain of left foot, initial encounter: Secondary | ICD-10-CM | POA: Diagnosis not present

## 2021-08-25 ENCOUNTER — Other Ambulatory Visit: Payer: Self-pay | Admitting: Family Medicine

## 2021-08-26 ENCOUNTER — Encounter: Payer: Self-pay | Admitting: Family Medicine

## 2021-08-29 DIAGNOSIS — F4323 Adjustment disorder with mixed anxiety and depressed mood: Secondary | ICD-10-CM | POA: Diagnosis not present

## 2021-08-29 DIAGNOSIS — F4310 Post-traumatic stress disorder, unspecified: Secondary | ICD-10-CM | POA: Diagnosis not present

## 2021-09-05 DIAGNOSIS — F4323 Adjustment disorder with mixed anxiety and depressed mood: Secondary | ICD-10-CM | POA: Diagnosis not present

## 2021-09-05 DIAGNOSIS — F4310 Post-traumatic stress disorder, unspecified: Secondary | ICD-10-CM | POA: Diagnosis not present

## 2021-09-11 ENCOUNTER — Encounter: Payer: Self-pay | Admitting: Family Medicine

## 2021-09-11 ENCOUNTER — Ambulatory Visit: Payer: BC Managed Care – PPO | Admitting: Family Medicine

## 2021-09-11 VITALS — BP 116/80 | HR 80 | Temp 97.5°F | Ht 68.03 in | Wt 205.8 lb

## 2021-09-11 DIAGNOSIS — J069 Acute upper respiratory infection, unspecified: Secondary | ICD-10-CM

## 2021-09-11 NOTE — Progress Notes (Signed)
Established Patient Office Visit  Subjective:  Patient ID: Kelly Navarro, female    DOB: January 05, 1986  Age: 36 y.o. MRN: [id number]  CC:  Chief Complaint  Patient presents with   Cough    X3 weeks, Tried Delsym, Sudafed and Zyrtec with little relief     HPI [name] presents with onset this past Sunday of cough and nasal congestion.  She states she has had a couple months now of what she thinks are back-to-back infections.  She has 2 children they are in daycare.  They have had frequent upper respiratory infections this year.  She also has increased stress of going through a divorce currently.  She thinks this may be impacting her immune system adversely.  She has taken Sudafed, Delsym, and Zyrtec for current upper respiratory symptoms.  She feels like the Sudafed and Delsym of helped somewhat.  She had virtual visit back in February and sounds like may have had some bronchial hyperreactivity at that time and was treated with prednisone and doxycycline and eventually improved.  She feels like this is likely a separate illness.  No fever.  No hemoptysis.  No dyspnea.  No sore throat.  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   Eczema    Gaucher disease, type I ([facility])    noted on [facility] genetic testing 05/2015   GERD (gastroesophageal reflux disease)    Headache(784.0)    menstrual    IBS (irritable bowel syndrome)    Incomplete miscarriage 02/22/2019   Scoliosis    very mild    Past Surgical History:  Procedure Laterality Date   CESAREAN SECTION N/A 10/21/2016   Procedure: CESAREAN SECTION;  Surgeon: Mitchel Honour, DO;  [facility];  Service: Obstetrics;  Laterality: N/A;    CESAREAN SECTION N/A 03/22/2020   Procedure: CESAREAN SECTION;  Surgeon: Noland Fordyce, MD;  Location: [facility];  Service: Obstetrics;  Laterality: N/A;   DILATION AND EVACUATION N/A 02/22/2019   Procedure: DILATATION AND EVACUATION;  Surgeon: Marcelle Overlie, MD;  Location: [facility];  Service: Gynecology;  Laterality: N/A;   GUM SURGERY     OTHER SURGICAL HISTORY  2020   septate uterus, polyp   WISDOM TOOTH EXTRACTION      Family History  Problem Relation Age of Onset   Hypertension Mother    Non-Hodgkin's lymphoma Mother    Healthy Father    Asthma Sister    Eczema Sister    Diabetes Maternal Grandfather    Hypertension Maternal Grandfather    Heart disease Maternal Grandfather    Breast cancer Paternal Grandmother        Age 50   Cancer Paternal Grandfather        multiple myeloma    Social History   Socioeconomic History   Marital status: Married    Spouse name: Not on file   Number of children: Not on file   Years of education: Not on file   Highest education level: Master's degree (e.g., MA, MS, MEng, MEd, MSW, MBA)  Occupational History   Occupation: Human resources officer at [employer]  Tobacco Use   Smoking status: Never   Smokeless tobacco: Never  Vaping Use   Vaping Use: Never used  Substance and Sexual Activity   Alcohol use: Not Currently    Alcohol/week: 1.0 standard drink    Types: 1 Standard drinks or equivalent per week    Comment: none with pregnancy   Drug use: No   Sexual activity: Yes    Comment: 1st intercourse 36 yo--Fewer than 5 partners  Other Topics Concern   Not on file  Social History Narrative   Married. 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   1 dog.   Social Determinants of Health   Financial Resource Strain: Low Risk    Difficulty of Paying Living Expenses: Not hard at all  Food Insecurity: No Food Insecurity   Worried About Programme researcher, broadcasting/film/video in the Last Year: Never true   Ran Out  of Food in the Last Year: Never true  Transportation Needs: No Transportation Needs   Lack of Transportation (Medical): No   Lack of Transportation (Non-Medical): No  Physical Activity: Insufficiently Active   Days of Exercise per Week: 3 days   Minutes of Exercise per Session: 40 min  Stress: Stress Concern Present   Feeling of Stress : Very much  Social Connections: Socially Integrated   Frequency of Communication with Friends and Family: More than three times a week   Frequency of Social Gatherings with Friends and Family: Twice a week   Attends Religious Services: More than 4 times per year   Active Member of [employer] or Organizations: Yes   Attends Engineer, structural: More than 4 times per year   Marital Status: Married  Catering manager Violence: Not on file    Outpatient Medications Prior to Visit  Medication Sig Dispense Refill   albuterol (VENTOLIN HFA) 108 (90 Base) MCG/ACT inhaler Inhale 2 puffs into the lungs every 6 (six) hours as needed for wheezing or shortness of breath. 8 g 0   ALPRAZolam (XANAX) 0.5 MG tablet Take 0.5-1 tablets (0.25-0.5 mg total) by mouth at bedtime as needed for anxiety. 20 tablet 0   betamethasone dipropionate 0.05 % cream Apply topically 2 (two) times daily. 30 g 2   busPIRone (BUSPAR) 7.5 MG tablet TAKE TWO TABLETS (15mg ) TWICE DAILY. 180 tablet 1   FLUoxetine (PROZAC) 20 MG tablet Take 20 mg by mouth daily.     hydrOXYzine (ATARAX) 25 MG tablet Take 1 tablet (25 mg total) by mouth 3 (three) times daily as needed for anxiety. 90 tablet 2   ibuprofen (ADVIL) 800 MG tablet Take 1 tablet (800 mg total) by mouth every 8 (eight) hours. 30 tablet 0   Prenatal Vit-Fe Fumarate-FA (PRENATAL MULTIVITAMIN) TABS tablet Take 1 tablet by mouth daily at 12 noon.     No facility-administered medications prior to visit.    Allergies  Allergen Reactions   Amoxicillin Hives    Has patient had a PCN reaction causing immediate rash, facial/tongue/throat  swelling, SOB or lightheadedness with hypotension: Yes Has patient had a PCN reaction causing severe rash involving mucus membranes or skin necrosis: No Has patient had a PCN reaction that required hospitalization No Has patient had a PCN reaction occurring within the last 10 years: No If all of the above answers are "NO", then may proceed with Cephalosporin use.   Bee Venom Other (See Comments)    Tongue swelling   Macrodantin [Nitrofurantoin Macrocrystal] Nausea And Vomiting   Penicillins Hives    Has patient had a PCN reaction causing immediate rash, facial/tongue/throat swelling, SOB or lightheadedness with  hypotension: Yes Has patient had a PCN reaction causing severe rash involving mucus membranes or skin necrosis: No Has patient had a PCN reaction that required hospitalization No Has patient had a PCN reaction occurring within the last 10 years: No If all of the above answers are "NO", then may proceed with Cephalosporin use.   Pineapple     Itchy throat   Sulfa Antibiotics Hives    ROS Review of Systems  Constitutional:  Negative for chills and fatigue.  HENT:  Positive for congestion. Negative for ear pain and sore throat.   Respiratory:  Positive for cough. Negative for shortness of breath and wheezing.   Cardiovascular:  Negative for chest pain.     Objective:    Physical Exam Vitals reviewed.  Constitutional:      Appearance: Normal appearance.  HENT:     Right Ear: Tympanic membrane normal.     Left Ear: Tympanic membrane normal.     Mouth/Throat:     Pharynx: Oropharynx is clear. No oropharyngeal exudate or posterior oropharyngeal erythema.  Cardiovascular:     Rate and Rhythm: Normal rate and regular rhythm.  Pulmonary:     Effort: Pulmonary effort is normal.     Breath sounds: Normal breath sounds. No wheezing or rales.  Musculoskeletal:     Cervical back: Neck supple.  Lymphadenopathy:     Cervical: No cervical adenopathy.  Neurological:     Mental  Status: She is alert.    BP 116/80 (BP Location: Left Arm, Patient Position: Sitting, Cuff Size: Normal)   Pulse 80   Temp (!) 97.5 F (36.4 C) (Oral)   Ht 5' 8.03" (1.728 m)   Wt 205 lb 12.8 oz (93.4 kg)   SpO2 99%   BMI 31.27 kg/m  Wt Readings from Last 3 Encounters:  09/11/21 205 lb 12.8 oz (93.4 kg)  07/19/21 202 lb 3.2 oz (91.7 kg)  06/05/21 201 lb 3.2 oz (91.3 kg)     Health Maintenance Due  Topic Date Due   Hepatitis C Screening  Never done   TETANUS/TDAP  01/19/2021   COVID-19 Vaccine (5 - Booster for Moderna series) 07/26/2021   PAP SMEAR-Modifier  09/10/2021    There are no preventive care reminders to display for this patient.  Lab Results  Component Value Date   TSH 0.78 07/19/2021   Lab Results  Component Value Date   WBC 4.5 07/19/2021   HGB 11.8 (L) 07/19/2021   HCT 36.1 07/19/2021   MCV 87.7 07/19/2021   PLT 235.0 07/19/2021   Lab Results  Component Value Date   NA 137 07/19/2021   K 4.3 07/19/2021   CO2 29 07/19/2021   GLUCOSE 71 07/19/2021   BUN 9 07/19/2021   CREATININE 0.63 07/19/2021   BILITOT 0.3 07/19/2021   ALKPHOS 68 07/19/2021   AST 16 07/19/2021   ALT 13 07/19/2021   PROT 7.8 07/19/2021   ALBUMIN 4.3 07/19/2021   CALCIUM 9.3 07/19/2021   ANIONGAP 7 08/05/2016   GFR 114.49 07/19/2021   Lab Results  Component Value Date   CHOL 102 07/19/2021   Lab Results  Component Value Date   HDL 36.90 (L) 07/19/2021   Lab Results  Component Value Date   LDLCALC 28 07/19/2021   Lab Results  Component Value Date   TRIG 183.0 (H) 07/19/2021   Lab Results  Component Value Date   CHOLHDL 3 07/19/2021   Lab Results  Component Value Date   HGBA1C 5.3 07/19/2021  Assessment & Plan:   Problem List Items Addressed This Visit   None Visit Diagnoses     Viral URI with cough    -  Primary     -Suspect viral URI with cough.  Nonfocal exam.  No wheezing noted. -Plenty fluids and rest -Consider over-the-counter Mucinex  1200 mg twice daily -Continue Sudafed as needed -Follow-up for any persistent or worsening symptoms or any new symptoms such as fever  No orders of the defined types were placed in this encounter.   Follow-up: No follow-ups on file.    Evelena Peat, MD

## 2021-09-11 NOTE — Patient Instructions (Signed)
Consider OTC Mucinex 1,200 mg twice daily ? ?Stay well hydrated. ? ? ?

## 2021-09-12 DIAGNOSIS — F4323 Adjustment disorder with mixed anxiety and depressed mood: Secondary | ICD-10-CM | POA: Diagnosis not present

## 2021-09-12 DIAGNOSIS — F4310 Post-traumatic stress disorder, unspecified: Secondary | ICD-10-CM | POA: Diagnosis not present

## 2021-10-01 ENCOUNTER — Other Ambulatory Visit: Payer: Self-pay | Admitting: Family Medicine

## 2021-10-02 MED ORDER — ALPRAZOLAM 0.5 MG PO TABS: 0.2500 mg | ORAL_TABLET | Freq: Every evening | ORAL | 1 refills | Status: DC | PRN

## 2021-10-03 ENCOUNTER — Ambulatory Visit (INDEPENDENT_AMBULATORY_CARE_PROVIDER_SITE_OTHER): Payer: BC Managed Care – PPO

## 2021-10-03 ENCOUNTER — Encounter: Payer: Self-pay | Admitting: Family Medicine

## 2021-10-03 ENCOUNTER — Ambulatory Visit: Payer: BC Managed Care – PPO | Admitting: Family Medicine

## 2021-10-03 VITALS — BP 105/66 | HR 93 | Temp 98.4°F | Wt 203.8 lb

## 2021-10-03 DIAGNOSIS — J40 Bronchitis, not specified as acute or chronic: Secondary | ICD-10-CM | POA: Diagnosis not present

## 2021-10-03 DIAGNOSIS — R509 Fever, unspecified: Secondary | ICD-10-CM | POA: Diagnosis not present

## 2021-10-03 DIAGNOSIS — R051 Acute cough: Secondary | ICD-10-CM

## 2021-10-03 DIAGNOSIS — J069 Acute upper respiratory infection, unspecified: Secondary | ICD-10-CM

## 2021-10-03 DIAGNOSIS — R059 Cough, unspecified: Secondary | ICD-10-CM | POA: Diagnosis not present

## 2021-10-03 DIAGNOSIS — J029 Acute pharyngitis, unspecified: Secondary | ICD-10-CM | POA: Diagnosis not present

## 2021-10-03 LAB — POC COVID19 BINAXNOW: SARS Coronavirus 2 Ag: NEGATIVE

## 2021-10-03 LAB — POCT INFLUENZA A/B
Influenza A, POC: NEGATIVE
Influenza B, POC: NEGATIVE

## 2021-10-03 LAB — POCT RAPID STREP A (OFFICE): Rapid Strep A Screen: NEGATIVE

## 2021-10-03 NOTE — Progress Notes (Signed)
Subjective:  ? ? Patient ID: Kelly Navarro, female    DOB: [birth year], 36 y.o.   MRN: [id number] ? ?Chief Complaint  ?Patient presents with  ? Sore Throat  ?  Started Monday, then got worse with chills and fever. Fever this am 103. Took ibuprofen last night, this am tylenol at 7:30 am then 9:30 took ibuprofen this morning also.   ? ? ?HPI ?Patient was seen today for acute concern.  Pt with sore throat, fever Tmax 103 F this am, hoarse voice, cough, headache x 4 d.  Patient denies rhinorrhea, congestion, nausea, vomiting, diarrhea, ear pain/pressure, facial pain/pressure.  Patient tried Zyrtec, Mucinex, ibuprofen and Tylenol for fever.  Sick contacts include patient's 87-month-old and 27-year-old children, one is in daycare.  Neither child had strep throat at recent Pediatrician visit. ? ?Past Medical History:  ?Diagnosis Date  ? Bee sting allergy   ? Blood transfusion without reported diagnosis 01/2019  ? folowing d&c  ? Breech presentation 03/22/2020  ? Carrier of genetic disorder 05/2015 [facility] genetic testing  ? 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 Eczema   ? Gaucher disease, type I (HCC)   ? noted on [facility] genetic testing 05/2015  ? GERD (gastroesophageal reflux disease)   ? Headache(784.0)   ? menstrual   ? IBS (irritable bowel syndrome)   ? Incomplete miscarriage 02/22/2019  ? Scoliosis   ? very mild  ? ? ?Allergies  ?Allergen Reactions  ? Amoxicillin Hives  ?  Has patient had a PCN reaction causing immediate rash, facial/tongue/throat swelling, SOB or lightheadedness with hypotension: Yes ?Has patient had a PCN reaction causing severe rash involving mucus membranes or skin necrosis: No ?Has patient had a PCN reaction that required hospitalization No ?Has patient had a PCN reaction occurring within the last 10 years: No ?If all of the above answers are "NO", then may proceed with Cephalosporin use.  ? Bee Venom Other (See Comments)  ?  Tongue  swelling  ? Macrodantin [Nitrofurantoin Macrocrystal] Nausea And Vomiting  ? Penicillins Hives  ?  Has patient had a PCN reaction causing immediate rash, facial/tongue/throat swelling, SOB or lightheadedness with hypotension: Yes ?Has patient had a PCN reaction causing severe rash involving mucus membranes or skin necrosis: No ?Has patient had a PCN reaction that required hospitalization No ?Has patient had a PCN reaction occurring within the last 10 years: No ?If all of the above answers are "NO", then may proceed with Cephalosporin use.  ? Pineapple   ?  Itchy throat  ? Sulfa Antibiotics Hives  ? ? ?ROS ?General: Denies chills, night sweats, changes in weight, changes in appetite +fever ?HEENT: Denies ear pain, changes in vision, rhinorrhea +sore throat, hoarse voice, HA ?CV: Denies CP, palpitations, SOB, orthopnea ?Pulm: Denies SOB, wheezing  +cough ?GI: Denies abdominal pain, nausea, vomiting, diarrhea, constipation ?GU: Denies dysuria, hematuria, frequency, vaginal discharge ?Msk: Denies muscle cramps, joint pains ?Neuro: Denies weakness, numbness, tingling ?Skin: Denies rashes, bruising ?Psych: Denies depression, anxiety, hallucinations ? ?   ?Objective:  ?  ?Blood pressure 105/66, pulse 93, temperature 98.4 ?F (36.9 ?C), temperature source Oral, weight 203 lb 12.8 oz (92.4 kg), SpO2 98 %, not currently breastfeeding. ? ?Gen. Pleasant, well-nourished, in no distress, normal affect   ?HEENT: [place]/AT, face symmetric, conjunctiva clear, no scleral icterus, PERRLA, EOMI, nares patent without drainage, pharynx with mild erythema, no exudate.  Tms full b/l. ?Lungs: cough, no accessory muscle use, subtle decreased bs of R  posterior base no wheezes or rales ?Cardiovascular: RRR, no m/r/g, no peripheral edema ?Abdomen: BS present, soft, NT/ND. ?Musculoskeletal: No deformities, no cyanosis or clubbing, normal tone ?Neuro:  A&Ox3, CN II-XII intact, normal gait ?Skin:  Warm, no lesions/ rash ? ? ?Wt Readings from Last 3  Encounters:  ?10/03/21 203 lb 12.8 oz (92.4 kg)  ?09/11/21 205 lb 12.8 oz (93.4 kg)  ?07/19/21 202 lb 3.2 oz (91.7 kg)  ? ? ?Lab Results  ?Component Value Date  ? WBC 4.5 07/19/2021  ? HGB 11.8 (L) 07/19/2021  ? HCT 36.1 07/19/2021  ? PLT 235.0 07/19/2021  ? GLUCOSE 71 07/19/2021  ? CHOL 102 07/19/2021  ? TRIG 183.0 (H) 07/19/2021  ? HDL 36.90 (L) 07/19/2021  ? LDLDIRECT 36.0 08/01/2020  ? LDLCALC 28 07/19/2021  ? ALT 13 07/19/2021  ? AST 16 07/19/2021  ? NA 137 07/19/2021  ? K 4.3 07/19/2021  ? CL 104 07/19/2021  ? CREATININE 0.63 07/19/2021  ? BUN 9 07/19/2021  ? CO2 29 07/19/2021  ? TSH 0.78 07/19/2021  ? INR 1.0 03/22/2020  ? HGBA1C 5.3 07/19/2021  ? ? ?Assessment/Plan: ? ?Bronchitis ?-CXR with concern for bronchitis ?-continue treatment of symptoms with OTC meds ?-albuterol inhaler prn ?-abx not currently indicated. ? ?Sore throat  ?-POC testing for strep, infulenza, and COVID negative. ?-gargle with warm salt water or chloraseptic spray. ?- Plan: POC Rapid Strep A, Culture, Group A Strep, POC Influenza A/B, POC COVID-19 ? ?Fever, unspecified fever cause ?-likely 2/2 viral illness ?-continue alternating tylenol and NSAIDs, fluids, rest, etc. ? - Plan: POC Rapid Strep A, Culture, Group A Strep, POC Influenza A/B, POC COVID-19, DG Chest 2 View ? ?Acute cough  ?-likely 2/2 acute viral illness as pt's children recently sick ?-continue OTC cough/could medications, honey, warm fluids, etc ?-given slightly decreased bs in R posterior lower lobe on exam CXR ordered. ?- Plan: DG Chest 2 View ? ?Viral URI ?-supportive care with OTC meds ?-given strict precautions ? ?F/u prn ? ?Abbe Amsterdam, MD ?

## 2021-10-04 ENCOUNTER — Ambulatory Visit (HOSPITAL_COMMUNITY)
Admission: EM | Admit: 2021-10-04 | Discharge: 2021-10-04 | Disposition: A | Discharge status: Home / Self Care | Payer: BC Managed Care – PPO | Attending: Physician Assistant | Admitting: Physician Assistant

## 2021-10-04 ENCOUNTER — Ambulatory Visit (HOSPITAL_COMMUNITY): Payer: BC Managed Care – PPO

## 2021-10-04 ENCOUNTER — Encounter (HOSPITAL_COMMUNITY): Payer: Self-pay

## 2021-10-04 DIAGNOSIS — R509 Fever, unspecified: Secondary | ICD-10-CM | POA: Insufficient documentation

## 2021-10-04 DIAGNOSIS — B349 Viral infection, unspecified: Secondary | ICD-10-CM

## 2021-10-04 LAB — CBC WITH DIFFERENTIAL/PLATELET
Abs Immature Granulocytes: 0.03 10*3/uL (ref 0.00–0.07)
Basophils Absolute: 0 10*3/uL (ref 0.0–0.1)
Basophils Relative: 0 %
Eosinophils Absolute: 0 10*3/uL (ref 0.0–0.5)
Eosinophils Relative: 0 %
HCT: 36.9 % (ref 36.0–46.0)
Hemoglobin: 12.1 g/dL (ref 12.0–15.0)
Immature Granulocytes: 0 %
Lymphocytes Relative: 17 %
Lymphs Abs: 1.2 10*3/uL (ref 0.7–4.0)
MCH: 28.2 pg (ref 26.0–34.0)
MCHC: 32.8 g/dL (ref 30.0–36.0)
MCV: 86 fL (ref 80.0–100.0)
Monocytes Absolute: 0.8 10*3/uL (ref 0.1–1.0)
Monocytes Relative: 10 %
Neutro Abs: 5.3 10*3/uL (ref 1.7–7.7)
Neutrophils Relative %: 73 %
Platelets: 193 10*3/uL (ref 150–400)
RBC: 4.29 MIL/uL (ref 3.87–5.11)
RDW: 13.4 % (ref 11.5–15.5)
WBC: 7.3 10*3/uL (ref 4.0–10.5)
nRBC: 0 % (ref 0.0–0.2)

## 2021-10-04 LAB — POCT URINALYSIS DIPSTICK, ED / UC
Bilirubin Urine: NEGATIVE
Glucose, UA: NEGATIVE mg/dL
Ketones, ur: NEGATIVE mg/dL
Leukocytes,Ua: NEGATIVE
Nitrite: NEGATIVE
Protein, ur: NEGATIVE mg/dL
Specific Gravity, Urine: 1.01 (ref 1.005–1.030)
Urobilinogen, UA: 0.2 mg/dL (ref 0.0–1.0)
pH: 6 (ref 5.0–8.0)

## 2021-10-04 LAB — COMPREHENSIVE METABOLIC PANEL
ALT: 16 U/L (ref 0–44)
AST: 18 U/L (ref 15–41)
Albumin: 3.9 g/dL (ref 3.5–5.0)
Alkaline Phosphatase: 76 U/L (ref 38–126)
Anion gap: 7 (ref 5–15)
BUN: 6 mg/dL (ref 6–20)
CO2: 25 mmol/L (ref 22–32)
Calcium: 9.2 mg/dL (ref 8.9–10.3)
Chloride: 105 mmol/L (ref 98–111)
Creatinine, Ser: 0.71 mg/dL (ref 0.44–1.00)
GFR, Estimated: >60 mL/min (ref >60)
Glucose, Bld: 97 mg/dL (ref 70–99)
Potassium: 3.8 mmol/L (ref 3.5–5.1)
Sodium: 137 mmol/L (ref 135–145)
Total Bilirubin: 0.6 mg/dL (ref 0.3–1.2)
Total Protein: 8.7 g/dL — ABNORMAL HIGH (ref 6.5–8.1)

## 2021-10-04 LAB — POCT RAPID STREP A, ED / UC: Streptococcus, Group A Screen (Direct): NEGATIVE

## 2021-10-04 NOTE — Discharge Instructions (Signed)
You are negative for strep.  Your urine does not look like it is infected.  We will send this off for culture and contact you if we need to start any antibiotics.  I will contact you with your lab results if we need to change your treatment plan.  Alternate Tylenol and ibuprofen on a scheduled basis for the next several days to manage fever.  Make sure you are drinking plenty of fluid.  If after several days you start weaning off medication and your fever returns you should be reevaluated.  If at any point you have high fever not responding to medication, nausea/vomiting interfere with oral intake, weakness, chest pain, shortness of breath you should be seen immediately. ?

## 2021-10-04 NOTE — ED Provider Notes (Signed)
?[facility] ? ? ? ?CSN: [id number] ?Arrival date & time: 10/04/21  0945 ? ? ?  ? ?History   ?Chief Complaint ?Chief Complaint  ?Patient presents with  ? Fever  ?  Entered by patient  ? ? ?HPI ?Kelly Navarro is a 36 y.o. female.  ? ?Patient presents today with a 3-day history of fever.  She reports associated nasal congestion, sore throat, body aches, abdominal discomfort, cough denies chest pain or shortness of breath.  She reports Tmax of 104 ?F.  She is having to take Tylenol and ibuprofen regularly for symptoms to manage fever but has recurrent high fever as soon as medication wears off.  She was seen by her primary care yesterday and had negative strep, COVID, flu testing.  X-ray was obtained that showed likely bronchitis without focal consolidation.  She has been using Tylenol, ibuprofen, albuterol without improvement of symptoms.  She does report that she has several young kids in daycare who have been sick recently but their symptoms resolved very quickly.  She denies any significant urinary symptoms.  Denies any severe abdominal pain.  She is concerned about ongoing severe fever prompting evaluation. ? ? ?Past Medical History:  ?Diagnosis Date  ? Bee sting allergy   ? Blood transfusion without reported diagnosis 01/2019  ? folowing d&c  ? Breech presentation 03/22/2020  ? Carrier of genetic disorder 05/2015 [facility] genetic testing  ? 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 Eczema   ? Gaucher disease, type I ([place])   ? noted on [facility] genetic testing 05/2015  ? GERD (gastroesophageal reflux disease)   ? Headache(784.0)   ? menstrual   ? IBS (irritable bowel syndrome)   ? Incomplete miscarriage 02/22/2019  ? Scoliosis   ? very mild  ? ? ?Patient Active Problem List  ? Diagnosis Date Noted  ? Influenza A (H1N1) 06/05/2021  ? Thrombophilia affecting pregnancy, antepartum (PAI) 03/25/2020  ? Postpartum care following cesarean delivery (9/23)  03/23/2020  ? Placental abruption in third trimester 03/23/2020  ? Previous cesarean delivery, delivered 03/23/2020  ? Gaucher disease, type I ([place]) 07/10/2015  ? ? ?Past Surgical History:  ?Procedure Laterality Date  ? CESAREAN SECTION N/A 10/21/2016  ? Procedure: CESAREAN SECTION;  Surgeon: Linda Hedges, DO;  Location: [facility];  Service: Obstetrics;  Laterality: N/A;  ? CESAREAN SECTION N/A 03/22/2020  ? Procedure: CESAREAN SECTION;  Surgeon: Aloha Gell, MD;  Location: [facility];  Service: Obstetrics;  Laterality: N/A;  ? DILATION AND EVACUATION N/A 02/22/2019  ? Procedure: DILATATION AND EVACUATION;  Surgeon: Dian Queen, MD;  Location: [facility];  Service: Gynecology;  Laterality: N/A;  ? GUM SURGERY    ? OTHER SURGICAL HISTORY  2020  ? septate uterus, polyp  ? WISDOM TOOTH EXTRACTION    ? ? ?OB History   ? ? Gravida  ?4  ? Para  ?2  ? Term  ?2  ? Preterm  ?   ? AB  ?1  ? Living  ?2  ?  ? ? SAB  ?1  ? IAB  ?   ? Ectopic  ?   ? Multiple  ?0  ? Live Births  ?2  ?   ?  ?  ? ? ? ?Home Medications   ? ?Prior to Admission medications   ?Medication Sig Start Date End Date Taking? Authorizing Provider  ?albuterol (VENTOLIN HFA) 108 (90 Base) MCG/ACT inhaler Inhale 2 puffs into the lungs  every 6 (six) hours as needed for wheezing or shortness of breath. 08/12/21 09/11/21  Martinique, Betty G, MD  ?ALPRAZolam Duanne Moron) 0.5 MG tablet Take 0.5-1 tablets (0.25-0.5 mg total) by mouth at bedtime as needed for anxiety. 10/02/21   Caren Macadam, MD  ?betamethasone dipropionate 0.05 % cream Apply topically 2 (two) times daily. 07/19/21   Koberlein, Steele Berg, MD  ?busPIRone (BUSPAR) 7.5 MG tablet TAKE TWO TABLETS (58m) TWICE DAILY. 08/25/21   KCaren Macadam MD  ?FLUoxetine (PROZAC) 20 MG tablet Take 20 mg by mouth daily.    [provider]  ?hydrOXYzine (ATARAX) 25 MG tablet Take 1 tablet (25 mg total) by mouth 3 (three) times daily as needed for anxiety. 07/19/21   KCaren Macadam MD  ?ibuprofen  (ADVIL) 800 MG tablet Take 1 tablet (800 mg total) by mouth every 8 (eight) hours. 03/25/20   JSuzan Nailer CNM  ?Prenatal Vit-Fe Fumarate-FA (PRENATAL MULTIVITAMIN) TABS tablet Take 1 tablet by mouth daily at 12 noon.    [provider]  ? ? ?Family History ?Family History  ?Problem Relation Age of Onset  ? Hypertension Mother   ? Non-Hodgkin's lymphoma Mother   ? Healthy Father   ? Asthma Sister   ? Eczema Sister   ? Diabetes Maternal Grandfather   ? Hypertension Maternal Grandfather   ? Heart disease Maternal Grandfather   ? Breast cancer Paternal Grandmother   ?     Age 767 ? Cancer Paternal Grandfather   ?     multiple myeloma  ? ? ?Social History ?Social History  ? ?Tobacco Use  ? Smoking status: Never  ? Smokeless tobacco: Never  ?Vaping Use  ? Vaping Use: Never used  ?Substance Use Topics  ? Alcohol use: Not Currently  ?  Alcohol/week: 1.0 standard drink  ?  Types: 1 Standard drinks or equivalent per week  ?  Comment: none with pregnancy  ? Drug use: No  ? ? ? ?Allergies   ?Amoxicillin, Bee venom, Macrodantin [nitrofurantoin macrocrystal], Penicillins, Pineapple, and Sulfa antibiotics ? ? ?Review of Systems ?Review of Systems  ?Constitutional:  Positive for activity change, fatigue and fever. Negative for appetite change.  ?HENT:  Positive for congestion and sore throat. Negative for sinus pressure and sneezing.   ?Respiratory:  Positive for cough. Negative for shortness of breath.   ?Cardiovascular:  Negative for chest pain.  ?Gastrointestinal:  Positive for abdominal pain. Negative for diarrhea, nausea and vomiting.  ?Genitourinary:  Negative for dysuria, frequency and urgency.  ?Musculoskeletal:  Positive for arthralgias, back pain and myalgias.  ?Neurological:  Negative for dizziness, light-headedness and headaches.  ? ? ?Physical Exam ?Triage Vital Signs ?ED Triage Vitals  ?Enc Vitals Group  ?   BP 10/04/21 1054 115/79  ?   Pulse Rate 10/04/21 1054 93  ?   Resp 10/04/21 1054 18  ?   Temp  10/04/21 1054 98.3 ?F (36.8 ?C)  ?   Temp Source 10/04/21 1054 Oral  ?   SpO2 10/04/21 1054 97 %  ?   Weight --   ?   Height --   ?   Head Circumference --   ?   Peak Flow --   ?   Pain Score 10/04/21 1055 2  ?   Pain Loc --   ?   Pain Edu? --   ?   Excl. in [place] --   ? ?No data found. ? ?Updated Vital Signs ?BP 115/79 (BP Location:  Left Arm)   Pulse 93   Temp 98.3 ?F (36.8 ?C) (Oral)   Resp 18   LMP 10/02/2021   SpO2 97%   Breastfeeding No  ? ?Visual Acuity ?Right Eye Distance:   ?Left Eye Distance:   ?Bilateral Distance:   ? ?Right Eye Near:   ?Left Eye Near:    ?Bilateral Near:    ? ?Physical Exam ?Vitals reviewed.  ?Constitutional:   ?   General: She is awake. She is not in acute distress. ?   Appearance: Normal appearance. She is well-developed. She is not ill-appearing.  ?   Comments: Very pleasant female appears stated age in no acute distress sitting comfortably in exam room  ?HENT:  ?   Head: Normocephalic and atraumatic.  ?   Right Ear: Tympanic membrane, ear canal and external ear normal. Tympanic membrane is not erythematous or bulging.  ?   Left Ear: Tympanic membrane, ear canal and external ear normal. Tympanic membrane is not erythematous or bulging.  ?   Nose:  ?   Right Sinus: No maxillary sinus tenderness or frontal sinus tenderness.  ?   Left Sinus: No maxillary sinus tenderness or frontal sinus tenderness.  ?   Mouth/Throat:  ?   Pharynx: Uvula midline. Posterior oropharyngeal erythema present. No oropharyngeal exudate.  ?Cardiovascular:  ?   Rate and Rhythm: Normal rate and regular rhythm.  ?   Heart sounds: Normal heart sounds, S1 normal and S2 normal. No murmur heard. ?Pulmonary:  ?   Effort: Pulmonary effort is normal.  ?   Breath sounds: Normal breath sounds. No wheezing, rhonchi or rales.  ?   Comments: Clear to auscultation bilaterally ?Abdominal:  ?   General: Bowel sounds are normal.  ?   Palpations: Abdomen is soft.  ?   Tenderness: There is no abdominal tenderness. There is no  right CVA tenderness, left CVA tenderness, guarding or rebound.  ?Psychiatric:     ?   Behavior: Behavior is cooperative.  ? ? ? ?UC Treatments / Results  ?Labs ?(all labs ordered are listed, but only abnormal results are

## 2021-10-04 NOTE — ED Triage Notes (Signed)
Pt c/o high fever x3 days. States saw her PCP yesterday and tx for bronchitis. States had neg strep, covid, and flu. States this morning her temp 104, took tylenol and ibuprofen. States on call nurse advised her to come here. ?

## 2021-10-05 LAB — URINE CULTURE

## 2021-10-08 DIAGNOSIS — Z6831 Body mass index (BMI) 31.0-31.9, adult: Secondary | ICD-10-CM | POA: Diagnosis not present

## 2021-10-08 DIAGNOSIS — Z01419 Encounter for gynecological examination (general) (routine) without abnormal findings: Secondary | ICD-10-CM | POA: Diagnosis not present

## 2021-10-08 DIAGNOSIS — R87810 Cervical high risk human papillomavirus (HPV) DNA test positive: Secondary | ICD-10-CM | POA: Diagnosis not present

## 2021-12-04 ENCOUNTER — Telehealth: Payer: Self-pay

## 2021-12-04 NOTE — Telephone Encounter (Signed)
---  Caller states she woke up with mild ear pain in the left ear and feeling of fullness in the ear, she also has felt some throat irritation. Denies fever. Feels a lot of head congestion, fullness in her ear.  12/03/2021 4:33:05 PM Home Care Letta Pate, RN, [facility] Advice Given Per Guideline HOME CARE: * You should be able to treat this at home. TREATMENT - DECONGESTANT NASAL SPRAY: * If chewing or swallowing doesn't help after 1 or 2 hours, you can try using an over-the-counter (OTC) nasal decongestant drops. You can use the nose drops twice a day. * Oxymetazoline Nasal Drops (e.g., Afrin): Available OTC. Clean out the nose before using. Spray each nostril once, wait one minute for absorption, and then spray a second time. EXPECTED COURSE: * Ear congestion symptoms usually get better within 2 days (48 hours) with treatment. * It is safe to swim. CALL BACK IF: * Ear congestion lasts over 48 hours * Ear pain or fever occurs * You become worse CARE ADVICE given per Ear - Congestion (Adult) guideline. ANTIHISTAMINE MEDICINES FOR HAY FEVER WITH EAR CONGESTION: * CETIRIZINE (REACTINE, ZYRTEC): The adult dose is 10 mg. You take it once a day. Cetirizine is available in the [place] as Zyrtec and in [place] as Reactine. * LORATADINE (ALAVERT, CLARITIN): The adult dose is 10 mg. You take it once a day. Loratadine is available in the [place] as Programmer, systems; it is available in [place] as Claritin. AGGRAVATING FACTORS: * Upper respiratory infections (colds) and nasal allergies (hay fever) can block the eustachian tube. * If your eustachian tube is blocked, pressure builds up behind the ear drum (in the middle ear). REASSURANCE AND EDUCATION - EAR CONGESTION: * Definition: Ear congestion is the medical term used to describe symptoms of a stuffy, full, or plugged sensation in the ear. People also sometimes describe crackling or popping noises in the ear. Sometimes hearing seems slightly  muffled

## 2022-01-03 DIAGNOSIS — J0300 Acute streptococcal tonsillitis, unspecified: Secondary | ICD-10-CM | POA: Diagnosis not present

## 2022-02-24 ENCOUNTER — Ambulatory Visit: Payer: BC Managed Care – PPO | Admitting: Psychiatry

## 2022-02-26 ENCOUNTER — Encounter: Payer: Self-pay | Admitting: Psychiatry

## 2022-02-26 ENCOUNTER — Ambulatory Visit (INDEPENDENT_AMBULATORY_CARE_PROVIDER_SITE_OTHER): Payer: BC Managed Care – PPO | Admitting: Psychiatry

## 2022-02-26 ENCOUNTER — Other Ambulatory Visit: Payer: Self-pay | Admitting: Psychiatry

## 2022-02-26 VITALS — BP 116/75 | HR 95 | Ht 68.0 in | Wt 199.0 lb

## 2022-02-26 DIAGNOSIS — F4323 Adjustment disorder with mixed anxiety and depressed mood: Secondary | ICD-10-CM

## 2022-02-26 DIAGNOSIS — F5102 Adjustment insomnia: Secondary | ICD-10-CM

## 2022-02-26 MED ORDER — BUSPIRONE HCL 15 MG PO TABS: 15.0000 mg | ORAL_TABLET | Freq: Four times a day (QID) | ORAL | 1 refills | Status: DC

## 2022-02-26 MED ORDER — FLUOXETINE HCL 20 MG PO TABS: 20.0000 mg | ORAL_TABLET | Freq: Every day | ORAL | 1 refills | Status: DC

## 2022-02-26 MED ORDER — ALPRAZOLAM 0.5 MG PO TABS: 0.2500 mg | ORAL_TABLET | Freq: Every evening | ORAL | 1 refills | Status: DC | PRN
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
## 2022-03-26 ENCOUNTER — Encounter: Payer: Self-pay | Admitting: Psychiatry

## 2022-03-26 ENCOUNTER — Ambulatory Visit: Payer: BC Managed Care – PPO | Admitting: Psychiatry

## 2022-03-26 DIAGNOSIS — F4323 Adjustment disorder with mixed anxiety and depressed mood: Secondary | ICD-10-CM

## 2022-03-26 MED ORDER — BUSPIRONE HCL 30 MG PO TABS: 30.0000 mg | ORAL_TABLET | Freq: Two times a day (BID) | ORAL | 1 refills | Status: DC

## 2022-03-26 MED ORDER — FLUOXETINE HCL 20 MG PO TABS: 20.0000 mg | ORAL_TABLET | Freq: Every day | ORAL | 1 refills | Status: DC

## 2022-03-26 NOTE — Progress Notes (Signed)
Kelly Navarro [id number] December 04, 1985 36 y.o.  Subjective:   Patient ID:  Kelly Navarro is a 36 y.o. (DOB 05-May-1986) female.  Chief Complaint:  Chief Complaint  Patient presents with   Anxiety    HPI [name] presents to the office today for follow-up of depression and anxiety. She notices some continued "up and down." Notices some slight overall improvement in anxiety.   She has been trying to take Buspar in the middle of the day. She notices some "spikes in anxiety" and depression in response to triggers and situations involving her ex-husband. She reports some worry, rumination, and catastrophic thoughts. Occ physical s/s with anxiety- ie., had some nausea and increased HR after stressor this morning.  Denies any recent panic attacks. She has noticed some intrusive thoughts at times.   Notices her mood is slightly down when her children are not with her. She reports some feelings of "rage" and anger towards her ex. Denies thoughts of wanting to harm him.   Sleep has been "ok, some nights are better than others." She reports "strange dreams" and nightmares related to current situation. Awakens feeling tired. Energy and motivation is ok and lower when she does not have her children. Friend had to encourage her to go to a home store and once she was there she enjoyed it. Appetite has been ok. Concentration has been ok. Some diminished enjoyment in things, such as when she does not have her children with her. Enjoying some things and looking forward to things. Denies SI.   Past Psychiatric Medication Trials: Xanax-effective Hydroxyzine- Helps with falling asleep. May cause weird dreams.  Buspar- Tried increasing Buspar to 15 mg TID and thinks it was helpful.  Prozac-No effect on 10 mg. Felt less anxious with 20 mg. Initially had some sexual side effects   PHQ2-9    [place] Office Visit from 10/03/2021 in [place] at [place] from  09/11/2021 in [place] at [facility] from 08/01/2020 in [place] at [facility] from 04/30/2015 in [place]  PHQ-2 Total Score 2 1 0 0  PHQ-9 Total Score 5 5 -- --      Flowsheet Row ED from 10/04/2021 in [facility] at [place] No Risk        Review of Systems:  Review of Systems  Musculoskeletal:  Negative for gait problem.  Neurological:  Negative for dizziness.  Psychiatric/Behavioral:         Please refer to HPI    Medications: I have reviewed the patient's current medications.  Current Outpatient Medications  Medication Sig Dispense Refill   ALPRAZolam (XANAX) 0.5 MG tablet Take 0.5-1 tablets (0.25-0.5 mg total) by mouth at bedtime as needed for anxiety. 20 tablet 1   betamethasone dipropionate 0.05 % cream Apply topically 2 (two) times daily. 30 g 2   busPIRone (BUSPAR) 30 MG tablet Take 1 tablet (30 mg total) by mouth 2 (two) times daily. 60 tablet 1   FLUoxetine (PROZAC) 20 MG tablet Take 1 tablet (20 mg total) by mouth daily. 30 tablet 1   folic acid (FOLVITE) 1 MG tablet Take 1 mg by mouth daily.     hydrOXYzine (ATARAX) 25 MG tablet Take 1 tablet (25 mg total) by mouth 3 (three) times daily as needed for anxiety. 90 tablet 2   ibuprofen (ADVIL) 800 MG tablet Take 1 tablet (800 mg total) by mouth every 8 (eight) hours. 30 tablet  0   No current facility-administered medications for this visit.    Medication Side Effects: None  Allergies:  Allergies  Allergen Reactions   Amoxicillin Hives    Has patient had a PCN reaction causing immediate rash, facial/tongue/throat swelling, SOB or lightheadedness with hypotension: Yes Has patient had a PCN reaction causing severe rash involving mucus membranes or skin necrosis: No Has patient had a PCN reaction that required hospitalization No Has patient had a PCN reaction occurring within the last 10 years: No If all of the above  answers are "NO", then may proceed with Cephalosporin use.   Bee Venom Other (See Comments)    Tongue swelling   Macrodantin [Nitrofurantoin Macrocrystal] Nausea And Vomiting   Penicillins Hives    Has patient had a PCN reaction causing immediate rash, facial/tongue/throat swelling, SOB or lightheadedness with hypotension: Yes Has patient had a PCN reaction causing severe rash involving mucus membranes or skin necrosis: No Has patient had a PCN reaction that required hospitalization No Has patient had a PCN reaction occurring within the last 10 years: No If all of the above answers are "NO", then may proceed with Cephalosporin use.   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   Please see review of systems for further details on the patient's review from today.   Objective:   Physical Exam:  There were no vitals taken for this visit.  Physical Exam Constitutional:      General: She is not in acute distress. Musculoskeletal:        General: No deformity.  Neurological:     Mental Status: She is alert and oriented to person, place, and time.     Coordination: Coordination normal.  Psychiatric:        Attention and Perception: Attention and perception normal. She does not  perceive auditory or visual hallucinations.        Mood and Affect: Mood is anxious. Affect is not labile, blunt, angry or inappropriate.        Speech: Speech normal.        Behavior: Behavior normal.        Thought Content: Thought content normal. Thought content is not paranoid or delusional. Thought content does not include homicidal or suicidal ideation. Thought content does not include homicidal or suicidal plan.        Cognition and Memory: Cognition and memory normal.        Judgment: Judgment normal.     Comments: Insight intact Mood presents as less anxious compared to last exam. Mood presents as somewhat sad when talking about times when she is not able to be with her children     Lab Review:     Component Value Date/Time  NA 137 10/04/2021 1200   K 3.8 10/04/2021 1200   CL 105 10/04/2021 1200   CO2 25 10/04/2021 1200   GLUCOSE 97 10/04/2021 1200   BUN 6 10/04/2021 1200   CREATININE 0.71 10/04/2021 1200   CREATININE 0.76 11/08/2014 1603   CALCIUM 9.2 10/04/2021 1200   PROT 8.7 (H) 10/04/2021 1200   ALBUMIN 3.9 10/04/2021 1200   AST 18 10/04/2021 1200   ALT 16 10/04/2021 1200   ALKPHOS 76 10/04/2021 1200   BILITOT 0.6 10/04/2021 1200   GFRNONAA >60 10/04/2021 1200   GFRNONAA >89 11/08/2014 1603   GFRAA >60 08/05/2016 1326   GFRAA >89 11/08/2014 1603       Component Value Date/Time   WBC 7.3 10/04/2021 1200   RBC 4.29 10/04/2021 1200   HGB 12.1 10/04/2021 1200   HCT 36.9 10/04/2021 1200   PLT 193 10/04/2021 1200   MCV 86.0 10/04/2021 1200   MCH 28.2 10/04/2021 1200   MCHC 32.8 10/04/2021 1200   RDW 13.4 10/04/2021 1200   LYMPHSABS 1.2 10/04/2021 1200   MONOABS 0.8 10/04/2021 1200   EOSABS 0.0 10/04/2021 1200   BASOSABS 0.0 10/04/2021 1200    No results found for: "POCLITH", "LITHIUM"   No results found for: "PHENYTOIN", "PHENOBARB", "VALPROATE", "CBMZ"   .res Assessment: Plan:    Pt seen for 30 minutes and time spent discussing response to  increase in Buspar and other possible treatment options. She reports that she is unsure if taking Buspar mid-day has an immediate effect and that she is occasionally forgetting to take it mid-day. She notices some slight improvement in anxiety since increase in Buspar and questions if it may need to be increased further. Will increase Buspar to 30 mg po BID for anxiety. Discussed that she could try taking 30 mg one tab in the morning, 1/2 tab mid-day, and 1/2 tab at night with the option to take 30 mg twice daily if she misses mid-day dose or does not see an immediate improvement after taking Buspar mid-day. Discussed that most people do not experience an immediate improvement after taking Buspar, however a small percentage of people notice an almost immediate effect after taking Buspar and she had indicated on last visit that she may have experienced this.  Discussed potential benefits, risks, and side effects of increasing Prozac to either 30 mg or 40 mg and also Wellbutrin since she inquires about this. She reports that she would prefer to start with increase in Buspar and then consider other options if needed. Discussed that she could contact office if she would like to proceed with increase in Prozac prior to next visit.  Will continue Prozac 20 mg daily for anxiety and depression.  Continue Xanax 0.25-0.5 mg as needed for anxiety or insomnia.  Pt to follow-up in 4-6 weeks or sooner if clinically indicated.  Patient advised to contact office with any questions, adverse effects, or acute worsening in signs and symptoms.   Deena was seen today for anxiety.  Diagnoses and all orders for this visit:  Adjustment disorder with mixed anxiety and depressed mood -     busPIRone (BUSPAR) 30 MG tablet; Take 1 tablet (30 mg total) by mouth 2 (two) times daily. -     FLUoxetine (PROZAC) 20 MG tablet; Take 1 tablet (20 mg total) by mouth daily.     Please see After Visit Summary for patient specific  instructions.  Future Appointments  Date Time Provider Department Center  05/05/2022 11:00 AM Corie Chiquito,  PMHNP [facility] None    No orders of the defined types were placed in this encounter.   -------------------------------

## 2022-03-27 ENCOUNTER — Telehealth: Payer: Self-pay | Admitting: Psychiatry

## 2022-03-27 DIAGNOSIS — F4323 Adjustment disorder with mixed anxiety and depressed mood: Secondary | ICD-10-CM

## 2022-03-27 DIAGNOSIS — F5102 Adjustment insomnia: Secondary | ICD-10-CM

## 2022-03-27 MED ORDER — FLUOXETINE HCL 20 MG PO CAPS: 20.0000 mg | ORAL_CAPSULE | Freq: Every day | ORAL | 1 refills | Status: DC

## 2022-03-27 NOTE — Telephone Encounter (Signed)
Received fax from pharmacy asking if pt should receive capsules or tablets, and that pt has been receiving capsules. Order sent for Prozac capsules.

## 2022-04-01 DIAGNOSIS — R0781 Pleurodynia: Secondary | ICD-10-CM | POA: Diagnosis not present

## 2022-04-01 DIAGNOSIS — M25561 Pain in right knee: Secondary | ICD-10-CM | POA: Diagnosis not present

## 2022-04-12 ENCOUNTER — Encounter: Payer: Self-pay | Admitting: Family Medicine

## 2022-04-14 ENCOUNTER — Ambulatory Visit: Payer: BC Managed Care – PPO | Admitting: Family Medicine

## 2022-04-14 ENCOUNTER — Encounter: Payer: Self-pay | Admitting: Family Medicine

## 2022-04-14 VITALS — BP 102/68 | HR 92 | Temp 97.5°F | Wt 201.0 lb

## 2022-04-14 DIAGNOSIS — R0981 Nasal congestion: Secondary | ICD-10-CM | POA: Diagnosis not present

## 2022-04-14 DIAGNOSIS — J019 Acute sinusitis, unspecified: Secondary | ICD-10-CM

## 2022-04-14 LAB — POCT RAPID STREP A (OFFICE): Rapid Strep A Screen: NEGATIVE

## 2022-04-14 LAB — POCT INFLUENZA A/B
Influenza A, POC: NEGATIVE
Influenza B, POC: NEGATIVE

## 2022-04-14 LAB — POC COVID19 BINAXNOW: SARS Coronavirus 2 Ag: NEGATIVE

## 2022-04-14 MED ORDER — AZITHROMYCIN 250 MG PO TABS: ORAL_TABLET | ORAL | 0 refills | Status: DC

## 2022-04-14 NOTE — Progress Notes (Signed)
   Subjective:    Patient ID: Kelly Navarro, female    DOB: January 05, 1986, 36 y.o.   MRN: [id number]  HPI Here for 10 days of sinus pressure, left ear pain, PND, and coughing up yellow sputum. No fever or body aches. Taking fluids and Mucinex.    Review of Systems  Constitutional: Negative.   HENT:  Positive for congestion, ear pain, postnasal drip and sinus pressure. Negative for sore throat.   Eyes: Negative.   Respiratory:  Positive for cough. Negative for shortness of breath and wheezing.   Gastrointestinal: Negative.        Objective:   Physical Exam Constitutional:      Appearance: Normal appearance. She is not ill-appearing.  HENT:     Right Ear: Tympanic membrane, ear canal and external ear normal.     Left Ear: Tympanic membrane, ear canal and external ear normal.     Nose: Nose normal.     Mouth/Throat:     Pharynx: Oropharynx is clear.  Eyes:     Conjunctiva/sclera: Conjunctivae normal.  Pulmonary:     Effort: Pulmonary effort is normal.     Breath sounds: Normal breath sounds.  Lymphadenopathy:     Cervical: No cervical adenopathy.  Neurological:     Mental Status: She is alert.           Assessment & Plan:  Sinusitis, treat with a Zpack.  Alysia Penna, MD

## 2022-04-17 ENCOUNTER — Encounter: Payer: Self-pay | Admitting: Family Medicine

## 2022-04-17 DIAGNOSIS — J019 Acute sinusitis, unspecified: Secondary | ICD-10-CM

## 2022-04-18 MED ORDER — DOXYCYCLINE HYCLATE 100 MG PO TABS: 100.0000 mg | ORAL_TABLET | Freq: Two times a day (BID) | ORAL | 0 refills | Status: AC

## 2022-05-02 ENCOUNTER — Encounter: Payer: BC Managed Care – PPO | Admitting: Family Medicine

## 2022-05-05 ENCOUNTER — Ambulatory Visit (INDEPENDENT_AMBULATORY_CARE_PROVIDER_SITE_OTHER): Payer: BC Managed Care – PPO | Admitting: Psychiatry

## 2022-05-05 ENCOUNTER — Encounter: Payer: Self-pay | Admitting: Psychiatry

## 2022-05-05 DIAGNOSIS — F4323 Adjustment disorder with mixed anxiety and depressed mood: Secondary | ICD-10-CM | POA: Diagnosis not present

## 2022-05-05 MED ORDER — BUSPIRONE HCL 30 MG PO TABS: ORAL_TABLET | ORAL | 1 refills | Status: DC

## 2022-05-05 MED ORDER — ALPRAZOLAM 0.5 MG PO TABS: 0.2500 mg | ORAL_TABLET | Freq: Every evening | ORAL | 1 refills | Status: DC | PRN

## 2022-05-05 MED ORDER — FLUOXETINE HCL 20 MG PO CAPS: 20.0000 mg | ORAL_CAPSULE | Freq: Every day | ORAL | 1 refills | Status: DC

## 2022-05-05 NOTE — Progress Notes (Unsigned)
Kelly Navarro [id number] 08-29-1985 36 y.o.  Subjective:   Patient ID:  Kelly Navarro is a 36 y.o. (DOB 09/03/1985) female.  Chief Complaint: No chief complaint on file.   HPI Kelly Navarro presents to the office today for follow-up of anxiety.  "I think I am feeling pretty good.  Taking Buspar 30 mg in the morning, 15 mg mid-day, and 15 mg later in the day.  She feels that her anxiety is "better. I can pull myself out of it a little easier." She continues to have some "spikes" in anxiety around confrontation. She reports that sadness is less. She reports that she notices some sadness when she does not have her children and is able to pull herself out of this and plan things to look forward to. She reports that she is no longer staying in bed or having low energy and motivation. She reports that her energy and motivation have been ok. Enjoying things and looking forward to things. She reports that she is sleeping better and has been taking hydroxyzine less. Appetite has been ok. Concentration has been ok. Denies SI.   Had cold s/s for a few weeks.   She reports using Xanax prn about once a week.   She reports that she is uncertain about increase in Prozac.   Continues to see therapist regularly.   Past Psychiatric Medication Trials: Xanax-effective Hydroxyzine- Helps with falling asleep. May cause weird dreams.  Buspar- Tried increasing Buspar to 15 mg TID and thinks it was helpful.  Prozac-No effect on 10 mg. Felt less anxious with 20 mg. Initially had some sexual side effects   PHQ2-9    Flowsheet Row Office Visit from 04/14/2022 in [facility] at [place] Office Visit from 10/03/2021 in [facility] at [facility] from 09/11/2021 in [facility] at [facility] from 08/01/2020 in [facility] at [employer] [facility] from 04/30/2015 in [facility]  PHQ-2 Total Score 0 2 1 0 0  PHQ-9 Total Score 2 5 5  --  --      Flowsheet Row ED from 10/04/2021 in [facility] RISK CATEGORY No Risk        Review of Systems:  Review of Systems  Gastrointestinal: Negative.   Musculoskeletal:  Negative for gait problem.  Neurological:  Negative for tremors and headaches.  Psychiatric/Behavioral:         Please refer to HPI    Medications: I have reviewed the patient's current medications.  Current Outpatient Medications  Medication Sig Dispense Refill   ALPRAZolam (XANAX) 0.5 MG tablet Take 0.5-1 tablets (0.25-0.5 mg total) by mouth at bedtime as needed for anxiety. 20 tablet 1   azithromycin (ZITHROMAX Z-PAK) 250 MG tablet As directed 6 each 0   betamethasone dipropionate 0.05 % cream Apply topically 2 (two) times daily. 30 g 2   busPIRone (BUSPAR) 30 MG tablet Take 1 tablet (30 mg total) by mouth 2 (two) times daily. 60 tablet 1   FLUoxetine (PROZAC) 20 MG tablet Take 1 tablet (20 mg total) by mouth daily. 30 tablet 1   folic acid (FOLVITE) 1 MG tablet Take 1 mg by mouth daily.     hydrOXYzine (ATARAX) 25 MG tablet Take 1 tablet (25 mg total) by mouth 3 (three) times daily as needed for anxiety. 90 tablet 2   ibuprofen (ADVIL) 800 MG tablet Take 1 tablet (800 mg total) by mouth every 8 (eight) hours. 30 tablet 0  No current facility-administered medications for this visit.    Medication Side Effects: None  Allergies:  Allergies  Allergen Reactions   Amoxicillin Hives    Has patient had a PCN reaction causing immediate rash, facial/tongue/throat swelling, SOB or lightheadedness with hypotension: Yes Has patient had a PCN reaction causing severe rash involving mucus membranes or skin necrosis: No Has patient had a PCN reaction that required hospitalization No Has patient had a PCN reaction occurring within the last 10 years: No If all of the above answers are "NO", then may proceed with Cephalosporin use.   Bee Venom Other (See Comments)    Tongue swelling    Macrodantin [Nitrofurantoin Macrocrystal] Nausea And Vomiting   Penicillins Hives    Has patient had a PCN reaction causing immediate rash, facial/tongue/throat swelling, SOB or lightheadedness with hypotension: Yes Has patient had a PCN reaction causing severe rash involving mucus membranes or skin necrosis: No Has patient had a PCN reaction that required hospitalization No Has patient had a PCN reaction occurring within the last 10 years: No If all of the above answers are "NO", then may proceed with Cephalosporin use.   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   Please see review of systems for further details on the patient's review from today.   Objective:   Physical Exam:  There were no vitals taken for this visit.  Physical Exam  Lab Review:     Component Value Date/Time   NA 137 10/04/2021 1200   K 3.8 10/04/2021 1200   CL 105 10/04/2021 1200   CO2 25 10/04/2021 1200   GLUCOSE 97 10/04/2021 1200   BUN 6 10/04/2021 1200   CREATININE 0.71 10/04/2021 1200   CREATININE 0.76 11/08/2014 1603   CALCIUM 9.2 10/04/2021 1200   PROT 8.7 (H) 10/04/2021 1200   ALBUMIN 3.9 10/04/2021 1200   AST 18 10/04/2021 1200   ALT 16 10/04/2021 1200   ALKPHOS 76 10/04/2021 1200    BILITOT 0.6 10/04/2021 1200   GFRNONAA >60 10/04/2021 1200   GFRNONAA >89 11/08/2014 1603   GFRAA >60 08/05/2016 1326   GFRAA >89 11/08/2014 1603       Component Value Date/Time   WBC 7.3 10/04/2021 1200   RBC 4.29 10/04/2021 1200   HGB 12.1 10/04/2021 1200   HCT 36.9 10/04/2021 1200   PLT 193 10/04/2021 1200   MCV 86.0 10/04/2021 1200   MCH 28.2 10/04/2021 1200   MCHC 32.8 10/04/2021 1200   RDW 13.4 10/04/2021 1200   LYMPHSABS 1.2 10/04/2021 1200   MONOABS 0.8 10/04/2021 1200   EOSABS 0.0 10/04/2021 1200   BASOSABS 0.0 10/04/2021 1200    No results found for: "POCLITH", "LITHIUM"   No results found  for: "PHENYTOIN", "PHENOBARB", "VALPROATE", "CBMZ"   .res Assessment: Plan:    There are no diagnoses linked to this encounter.   Please see After Visit Summary for patient specific instructions.  Future Appointments  Date Time Provider [place]  05/26/2022  8:30 AM Farrel Conners, MD [id number]    No orders of the defined types were placed in this encounter.   -------------------------------

## 2022-05-26 ENCOUNTER — Encounter: Payer: Self-pay | Admitting: Family Medicine

## 2022-05-26 ENCOUNTER — Ambulatory Visit: Payer: BC Managed Care – PPO | Admitting: Family Medicine

## 2022-05-26 VITALS — BP 98/68 | HR 70 | Temp 98.2°F | Ht 68.0 in | Wt 204.7 lb

## 2022-05-26 DIAGNOSIS — E669 Obesity, unspecified: Secondary | ICD-10-CM | POA: Diagnosis not present

## 2022-05-26 NOTE — Patient Instructions (Addendum)
Try Zyrtec or Claritin once daily at bedtime to help with possible allergies in the fall.  Wegovy or Zepbound or Saxenda for weight loss  Total protein intake per day: at least 85-90 grams  Total fiber intake per day: at least 25 grams  Total carbs per day: less than 60 grams  Myfitnesspal, Loseit!

## 2022-05-26 NOTE — Progress Notes (Signed)
Established Patient Office Visit  Subjective   Patient ID: Kelly Navarro, female    DOB: 1985-09-06  Age: 36 y.o. MRN: [id number]  Chief Complaint  Patient presents with   Establish Care    Patient is here for [facility] visit. She reports she has had multiple sinus infections over the last year. States that she just had one last month but her symptoms are nearly resolved. Pt has already received her flu shot this year, we discussed getting the covid vaccine.   Anxiety /depression-- pt states she just started seeing a BH provider recently, states that she is separated from her husband about a year ago, states that there is some conflict there as well. Sees a therapist regularly. States that she feels like the medication is working for her. Pt reports that her weight is somewhat of an issue, states that she works out regularly and is conscious of what she is eating. We had a long discussion about diet and I gave the patient specific goals for protein, fiber and carb intake.   Current Outpatient Medications  Medication Instructions   [START ON 06/02/2022] ALPRAZolam (XANAX) 0.25-0.5 mg, Oral, At bedtime PRN   betamethasone dipropionate 0.05 % cream Topical, 2 times daily   busPIRone (BUSPAR) 30 MG tablet Take 1 tablet (30 mg total) by mouth every morning AND 0.5 tablets (15 mg total) 2 (two) times daily.   FLUoxetine (PROZAC) 20 mg, Oral, Daily   folic acid (FOLVITE) 1 mg, Oral, Daily   hydrOXYzine (ATARAX) 25 mg, Oral, 3 times daily PRN    Patient Active Problem List   Diagnosis Date Noted   Obesity (BMI 30.0-34.9) 05/26/2022   Influenza A (H1N1) 06/05/2021   Thrombophilia affecting pregnancy, antepartum (PAI) 03/25/2020   Postpartum care following cesarean delivery (9/23) 03/23/2020   Placental abruption in third trimester 03/23/2020   Previous cesarean delivery, delivered 03/23/2020   Gaucher disease, type I (HCC) 07/10/2015      Review of Systems  All other systems reviewed and  are negative.     Objective:     BP 98/68 (BP Location: Left Arm, Patient Position: Sitting, Cuff Size: Large)   Pulse 70   Temp 98.2 F (36.8 C) (Oral)   Ht 5\' 8"  (1.727 m)   Wt 204 lb 11.2 oz (92.9 kg)   LMP 04/25/2022 (Approximate)   SpO2 100%   BMI 31.12 kg/m    Physical Exam Vitals reviewed.  Constitutional:      Appearance: Normal appearance. She is well-groomed and normal weight.  Eyes:     Conjunctiva/sclera: Conjunctivae normal.  Neck:     Thyroid: No thyromegaly.  Cardiovascular:     Rate and Rhythm: Normal rate and regular rhythm.     Pulses: Normal pulses.     Heart sounds: S1 normal and S2 normal.  Pulmonary:     Effort: Pulmonary effort is normal.     Breath sounds: Normal breath sounds and air entry.  Abdominal:     General: Bowel sounds are normal.  Musculoskeletal:     Right lower leg: No edema.     Left lower leg: No edema.  Neurological:     Mental Status: She is alert and oriented to person, place, and time. Mental status is at baseline.     Gait: Gait is intact.  Psychiatric:        Mood and Affect: Mood and affect normal.        Speech: Speech normal.  Behavior: Behavior normal.        Judgment: Judgment normal.     No results found for any visits on 05/26/22.    The ASCVD Risk score (Arnett DK, et al., 2019) failed to calculate for the following reasons:   The 2019 ASCVD risk score is only valid for ages 27 to 31    Assessment & Plan:   Problem List Items Addressed This Visit       Unprioritized   Obesity (BMI 30.0-34.9) - Primary (Chronic)  I have had an extensive 30 minute conversation today with the patient about healthy eating habits, exercise, calorie and carb goals for sustainable and successful weight loss. I gave the patient caloric and protein daily intake values as well as described the importance of increasing fiber and water intake. I discussed weight loss medications that could be used in the treatment of this  patient. Handouts on low carb eating were given to the patient.  I advised her to check with her insurance company to see if the medication is covered. Pt will come back after January 2024 for her annual physical and we will discuss again.  No follow-ups on file.    Karie Georges, MD

## 2022-08-04 ENCOUNTER — Ambulatory Visit: Payer: BC Managed Care – PPO | Admitting: Psychiatry

## 2022-08-04 DIAGNOSIS — L718 Other rosacea: Secondary | ICD-10-CM | POA: Diagnosis not present

## 2022-08-04 DIAGNOSIS — L570 Actinic keratosis: Secondary | ICD-10-CM | POA: Diagnosis not present

## 2022-10-03 DIAGNOSIS — L570 Actinic keratosis: Secondary | ICD-10-CM | POA: Diagnosis not present

## 2022-11-13 ENCOUNTER — Other Ambulatory Visit: Payer: Self-pay | Admitting: Psychiatry

## 2022-11-13 DIAGNOSIS — F4323 Adjustment disorder with mixed anxiety and depressed mood: Secondary | ICD-10-CM

## 2022-11-13 NOTE — Telephone Encounter (Signed)
Please call to schedule an appt. Last seen in November, canceled February visit.

## 2022-11-14 NOTE — Telephone Encounter (Signed)
Sent My-Chart message

## 2022-11-17 ENCOUNTER — Other Ambulatory Visit: Payer: Self-pay | Admitting: *Deleted

## 2022-11-17 DIAGNOSIS — F419 Anxiety disorder, unspecified: Secondary | ICD-10-CM

## 2022-11-17 DIAGNOSIS — G4700 Insomnia, unspecified: Secondary | ICD-10-CM

## 2022-11-17 MED ORDER — HYDROXYZINE HCL 25 MG PO TABS: 25.0000 mg | ORAL_TABLET | Freq: Three times a day (TID) | ORAL | 2 refills | Status: DC | PRN

## 2022-11-19 ENCOUNTER — Ambulatory Visit: Payer: Self-pay | Admitting: Psychiatry

## 2022-12-08 ENCOUNTER — Telehealth: Payer: Self-pay | Admitting: *Deleted

## 2022-12-08 DIAGNOSIS — F4323 Adjustment disorder with mixed anxiety and depressed mood: Secondary | ICD-10-CM

## 2022-12-08 MED ORDER — BUSPIRONE HCL 30 MG PO TABS: ORAL_TABLET | ORAL | 1 refills | Status: DC

## 2022-12-08 NOTE — Telephone Encounter (Signed)
Script sent! Ok to refill in the future

## 2022-12-08 NOTE — Telephone Encounter (Signed)
[facility] faxed a refill request for Buspirone 7.5mg -take 2 tablets twice a day-#180.  Message sent to PCP as last Rx was given by psych.

## 2022-12-11 ENCOUNTER — Other Ambulatory Visit: Payer: Self-pay

## 2022-12-11 DIAGNOSIS — F4323 Adjustment disorder with mixed anxiety and depressed mood: Secondary | ICD-10-CM

## 2022-12-11 MED ORDER — FLUOXETINE HCL 20 MG PO CAPS: 20.0000 mg | ORAL_CAPSULE | Freq: Every day | ORAL | 0 refills | Status: DC

## 2022-12-11 NOTE — Telephone Encounter (Signed)
Pharmacy is calling back for clarification.

## 2022-12-12 MED ORDER — BUSPIRONE HCL 30 MG PO TABS: ORAL_TABLET | ORAL | 1 refills | Status: DC

## 2022-12-12 NOTE — Addendum Note (Signed)
Addended by: Johnella Moloney on: 12/12/2022 08:50 AM   Modules accepted: Orders

## 2022-12-12 NOTE — Telephone Encounter (Signed)
I just refilled what was reported in the med list-- ok to change the directions and dosage and send back to pharmacy

## 2022-12-12 NOTE — Telephone Encounter (Signed)
Rx resent.

## 2022-12-22 ENCOUNTER — Ambulatory Visit (INDEPENDENT_AMBULATORY_CARE_PROVIDER_SITE_OTHER): Payer: 59 | Admitting: Psychiatry

## 2022-12-22 ENCOUNTER — Encounter: Payer: Self-pay | Admitting: Psychiatry

## 2022-12-22 DIAGNOSIS — F4323 Adjustment disorder with mixed anxiety and depressed mood: Secondary | ICD-10-CM | POA: Diagnosis not present

## 2022-12-22 MED ORDER — BUSPIRONE HCL 30 MG PO TABS: 30.0000 mg | ORAL_TABLET | Freq: Two times a day (BID) | ORAL | 1 refills | Status: DC

## 2022-12-22 MED ORDER — ALPRAZOLAM 0.5 MG PO TABS: ORAL_TABLET | ORAL | 3 refills | Status: DC

## 2022-12-22 MED ORDER — BUSPIRONE HCL 30 MG PO TABS: ORAL_TABLET | ORAL | 1 refills | Status: DC

## 2022-12-22 MED ORDER — FLUOXETINE HCL 20 MG PO CAPS: 20.0000 mg | ORAL_CAPSULE | Freq: Every day | ORAL | 1 refills | Status: DC

## 2022-12-22 NOTE — Progress Notes (Signed)
Kelly Navarro [id number] 01-30-1986 37 y.o.  Subjective:   Patient ID:  Kelly Navarro is a 37 y.o. (DOB 10/02/1985) female.  Chief Complaint:  Chief Complaint  Patient presents with   Follow-up    Anxiety, situational depression, insomnia    HPI Kelly Navarro presents to the office today for follow-up of anxiety, depression, and insomnia.  She reports taking Buspar 30 mg in the morning and at bedtime. "Something is helping."  She has moved into a new house and feeling more settled. She reports "when I do get triggered by my ex... I am able to recover faster." She reports that she notices some sadness and thinking, "this is my life." She reports that she is able to redirect this and sadness is situational. She reports that in the winter she was having some nocturnal panic attacks and would wake up unable to breathe and unable to move her legs. She reports that she then started taking Buspar at night since she was having difficulty remembering to take it in the middle of the day. She reports that nocturnal panic attacks then stopped. She reports that she notices some anxiety when she or her children are sick and has had to take a 1/2 tab of Alprazolam prn on the rare occasion this occurs. She reports that generalized anxiety has improved. She reports occasional difficulty falling asleep "but generally sleep is fine." Occ weird, stress dreams. Energy and motivation have been good. Appetite has been ok. Concentration has been ok. Denies SI.   She reports taking Hydroxyzine nightly. She noticed a possible correlation between Melatonin and nocturnal panic attacks.   She reports that exercise has been helpful for her mood and energy.   Divorce is still being settled.   Seeing a therapist, [facility].   Past Psychiatric Medication Trials: Xanax-effective Hydroxyzine- Helps with falling asleep. May cause weird dreams.  Buspar- Tried increasing Buspar to 15 mg TID and thinks it  was helpful.  Prozac-No effect on 10 mg. Felt less anxious with 20 mg. Initially had some sexual side effects   PHQ2-9    Flowsheet Row Office Visit from 05/26/2022 in [facility] [place] Office Visit from 04/14/2022 in [facility] [place] Office Visit from 10/03/2021 in [facility] [place] Office Visit from 09/11/2021 in [facility] [place] Office Visit from 08/01/2020 in [facility] [place]  PHQ-2 Total Score 1 0 2 1 0  PHQ-9 Total Score 3 2 5 5  --      Flowsheet Row ED from 10/04/2021 in [facility] RISK CATEGORY No Risk        Review of Systems:  Review of Systems  Gastrointestinal: Negative.   Musculoskeletal:  Negative for gait problem.  Neurological:        Reports a post-menstrual headache most months.   Psychiatric/Behavioral:         Please refer to HPI    Medications: I have reviewed the patient's current medications.  Current Outpatient Medications  Medication Sig Dispense Refill   hydrOXYzine (ATARAX) 25 MG tablet Take 1 tablet (25 mg total) by mouth 3 (three) times daily as needed for anxiety. 90 tablet 2   ALPRAZolam (XANAX) 0.5 MG tablet TAKE 1/2 TO 1 TABLET BY MOUTH AT BEDTIME AS NEEDED FOR ANXIETY 20 tablet 3   busPIRone (BUSPAR) 30 MG tablet Take 1 tablet (30 mg total) by mouth 2 (  two) times daily. 180 tablet 1   FLUoxetine (PROZAC) 20 MG capsule Take 1 capsule (20 mg total) by mouth daily. 90 capsule 1   folic acid (FOLVITE) 1 MG tablet Take 1 mg by mouth daily. (Patient not taking: Reported on 12/22/2022)     No current facility-administered medications for this visit.    Medication Side Effects: None  Allergies:  Allergies  Allergen Reactions   Amoxicillin Hives    Has patient had a PCN reaction causing immediate rash, facial/tongue/throat swelling, SOB or lightheadedness with hypotension: Yes Has  patient had a PCN reaction causing severe rash involving mucus membranes or skin necrosis: No Has patient had a PCN reaction that required hospitalization No Has patient had a PCN reaction occurring within the last 10 years: No If all of the above answers are "NO", then may proceed with Cephalosporin use.   Bee Venom Other (See Comments)    Tongue swelling   Macrodantin [Nitrofurantoin Macrocrystal] Nausea And Vomiting   Penicillins Hives    Has patient had a PCN reaction causing immediate rash, facial/tongue/throat swelling, SOB or lightheadedness with hypotension: Yes Has patient had a PCN reaction causing severe rash involving mucus membranes or skin necrosis: No Has patient had a PCN reaction that required hospitalization No Has patient had a PCN reaction occurring within the last 10 years: No If all of the above answers are "NO", then may proceed with Cephalosporin use.   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   Please see review of systems for further details on the patient's review from today.   Objective:   Physical Exam:  There were no vitals taken for this visit.  Physical Exam Constitutional:      General: She is not in acute distress. Musculoskeletal:         General: No deformity.  Neurological:     Mental Status: She is alert and oriented to person, place, and time.     Coordination: Coordination normal.  Psychiatric:        Attention and Perception: Attention and perception normal. She does not perceive auditory or visual hallucinations.        Mood and Affect: Mood normal. Mood is not anxious or depressed. Affect is not labile, blunt, angry or inappropriate.        Speech: Speech normal.        Behavior: Behavior normal.        Thought Content: Thought content normal. Thought content is not paranoid or delusional. Thought content does not include homicidal or suicidal ideation. Thought content does not include homicidal or suicidal plan.        Cognition and Memory: Cognition and memory normal.  Judgment: Judgment normal.     Comments: Insight intact     Lab Review:     Component Value Date/Time   NA 137 10/04/2021 1200   K 3.8 10/04/2021 1200   CL 105 10/04/2021 1200   CO2 25 10/04/2021 1200   GLUCOSE 97 10/04/2021 1200   BUN 6 10/04/2021 1200   CREATININE 0.71 10/04/2021 1200   CREATININE 0.76 11/08/2014 1603   CALCIUM 9.2 10/04/2021 1200   PROT 8.7 (H) 10/04/2021 1200   ALBUMIN 3.9 10/04/2021 1200   AST 18 10/04/2021 1200   ALT 16 10/04/2021 1200   ALKPHOS 76 10/04/2021 1200   BILITOT 0.6 10/04/2021 1200   GFRNONAA >60 10/04/2021 1200   GFRNONAA >89 11/08/2014 1603   GFRAA >60 08/05/2016 1326   GFRAA >89 11/08/2014 1603       Component Value Date/Time   WBC 7.3 10/04/2021 1200   RBC 4.29 10/04/2021 1200   HGB 12.1 10/04/2021 1200   HCT 36.9 10/04/2021 1200   PLT 193 10/04/2021 1200   MCV 86.0 10/04/2021 1200   MCH 28.2 10/04/2021 1200   MCHC 32.8 10/04/2021 1200   RDW 13.4 10/04/2021 1200   LYMPHSABS 1.2 10/04/2021 1200   MONOABS 0.8 10/04/2021 1200   EOSABS 0.0 10/04/2021 1200   BASOSABS 0.0 10/04/2021 1200    No results found for: "POCLITH", "LITHIUM"   No results found for: "PHENYTOIN",  "PHENOBARB", "VALPROATE", "CBMZ"   .res Assessment: Plan:    I spent 28 minutes dedicated to the care of this patient on the date of this  encounter to include pre-visit review of records, face-to-face time with the patient discussing taking hydroxyzine nightly versus melatonin, change in Buspar administration, and health-related anxiety, ordering of medication, and post visit documentation. Discussed that hydroxyzine is safe to take nightly. Discussed that she could also take Hydroxyzine as needed, such as when anxiety is higher or unable to sleep, and then not take it if she is tired and sleepy and thinks she may be able to sleep without difficulty.  Will continue Prozac 20 mg daily for anxiety and mood.  Continue Alprazolam 0.5 mg 1/2-1 tab po at bedtime prn anxiety.  Continue Buspar 30 mg one tablet po BID for anxiety.  Recommend continuing psychotherapy.  Pt to follow-up in 6 months or sooner if clinically indicated.  Patient advised to contact office with any questions, adverse effects, or acute worsening in signs and symptoms.    Livian was seen today for follow-up.  Diagnoses and all orders for this visit:  Adjustment disorder with mixed anxiety and depressed mood -     ALPRAZolam (XANAX) 0.5 MG tablet; TAKE 1/2 TO 1 TABLET BY MOUTH AT BEDTIME AS NEEDED FOR ANXIETY -     Discontinue: busPIRone (BUSPAR) 30 MG tablet; Take 2 tablets by mouth twice a day -     FLUoxetine (PROZAC) 20 MG capsule; Take 1 capsule (20 mg total) by mouth daily. -     busPIRone (BUSPAR) 30 MG tablet; Take 1 tablet (30 mg total) by mouth 2 (two) times daily.     Please see After Visit Summary for patient specific instructions.  Future Appointments  Date Time Provider Department Center  06/18/2023  4:00 PM Corie Chiquito, PMHNP [facility] None    No orders of the defined types were placed in this encounter.   -------------------------------

## 2023-01-20 IMAGING — DX DG CHEST 2V
2 series · 2 of 2 positions shown · non-contrast
Comparison: None.

CLINICAL DATA: Cough

EXAM:
CHEST - 2 VIEW

[chest pa]
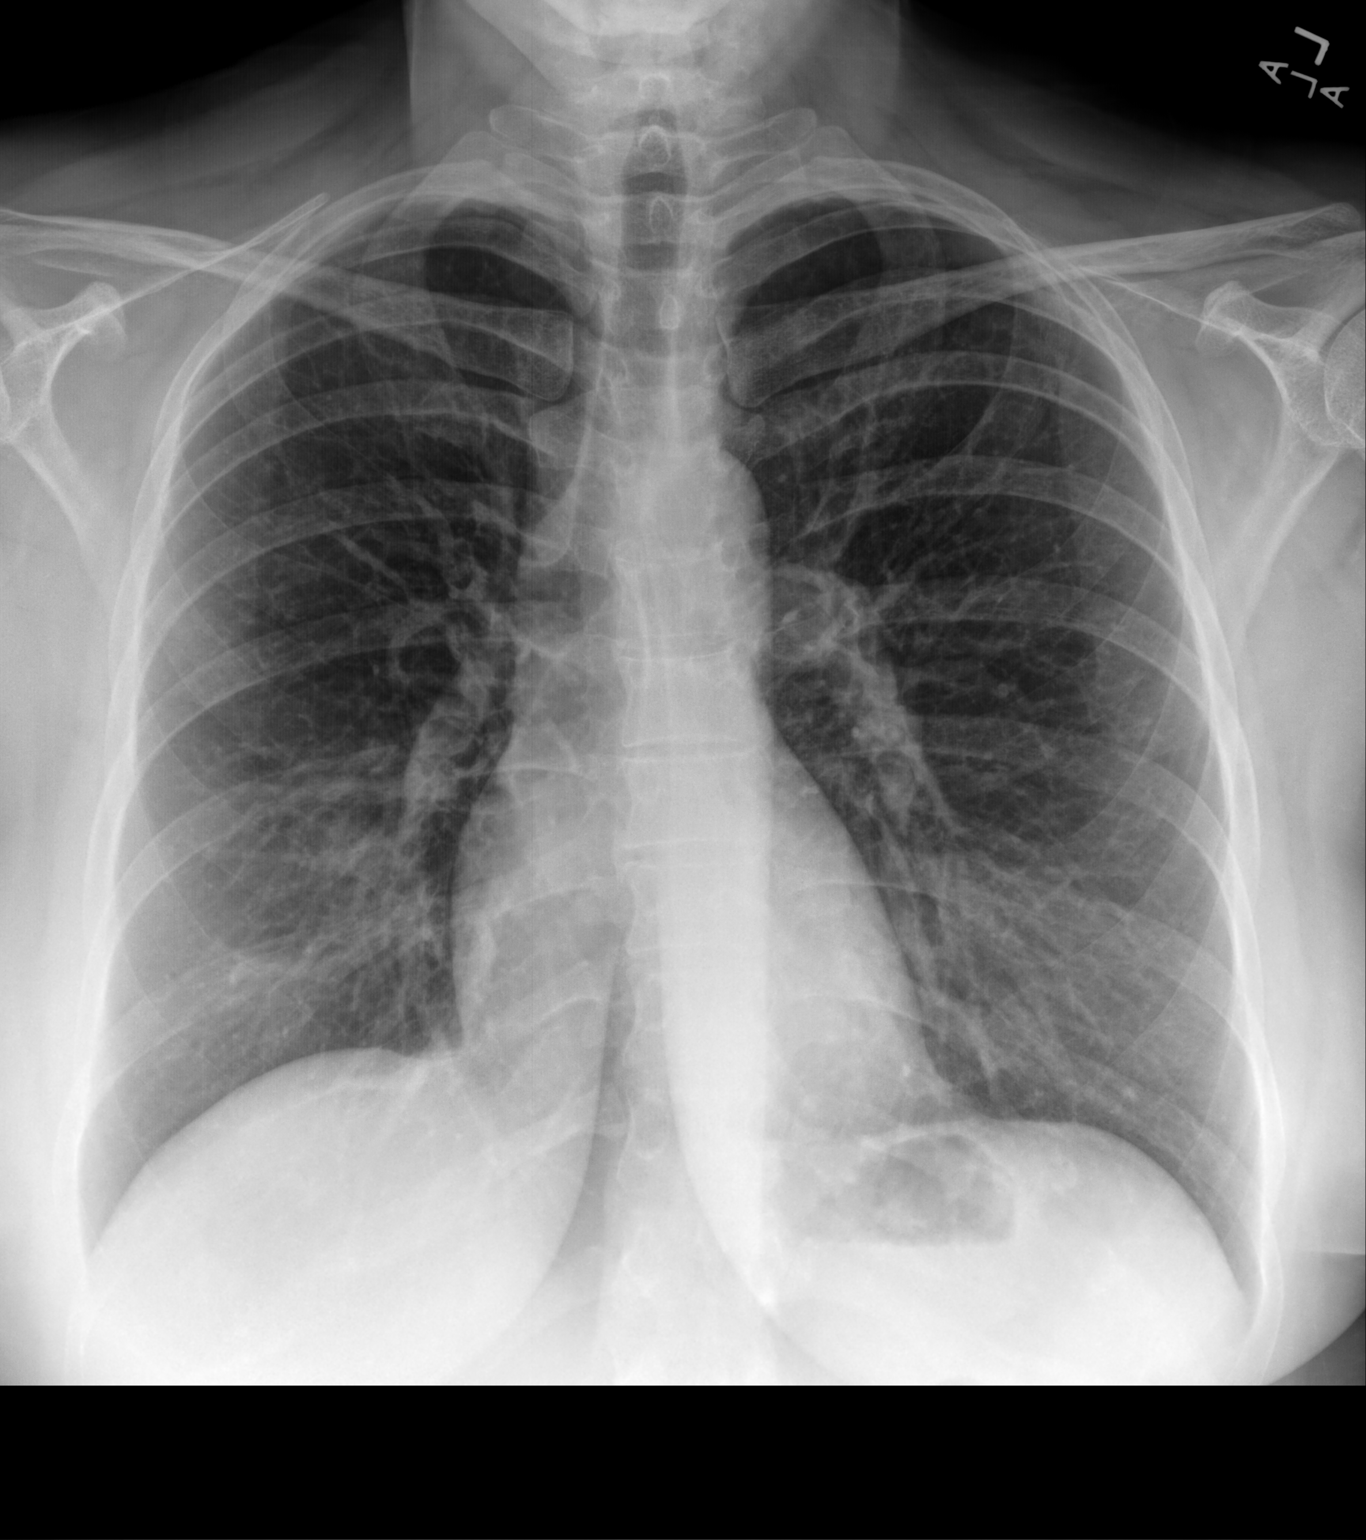

[chest lat]
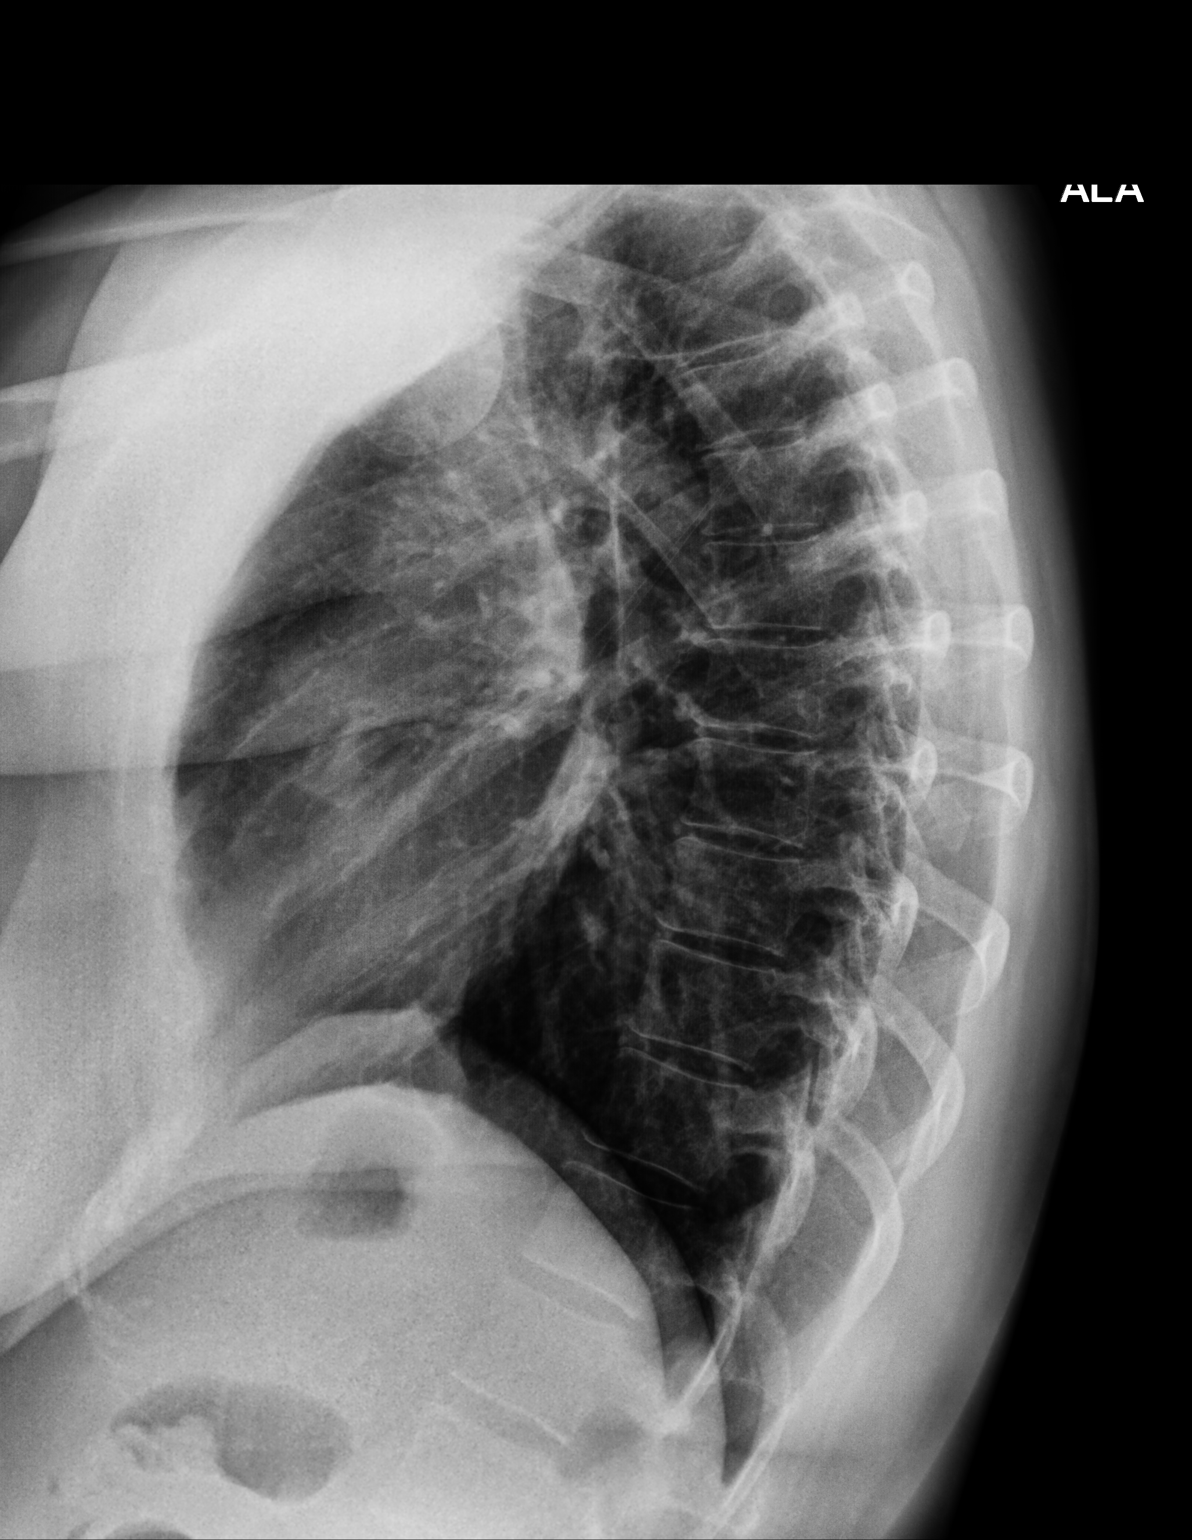

[2 of 2 positions shown; findings below may reference images not displayed]

FINDINGS: Cardiac size is within normal limits. There is no focal pulmonary
consolidation. There is slight prominence of interstitial markings
in the parahilar regions. There is no pleural effusion or
pneumothorax.
IMPRESSION: Slight prominence of interstitial markings in the parahilar regions
may suggest bronchitis. There is no focal pulmonary consolidation.
There is no pleural effusion.

## 2023-01-27 DIAGNOSIS — Z01419 Encounter for gynecological examination (general) (routine) without abnormal findings: Secondary | ICD-10-CM | POA: Diagnosis not present

## 2023-01-27 DIAGNOSIS — N898 Other specified noninflammatory disorders of vagina: Secondary | ICD-10-CM | POA: Insufficient documentation

## 2023-01-27 DIAGNOSIS — B977 Papillomavirus as the cause of diseases classified elsewhere: Secondary | ICD-10-CM | POA: Diagnosis not present

## 2023-01-27 DIAGNOSIS — N920 Excessive and frequent menstruation with regular cycle: Secondary | ICD-10-CM | POA: Insufficient documentation

## 2023-01-27 DIAGNOSIS — R87810 Cervical high risk human papillomavirus (HPV) DNA test positive: Secondary | ICD-10-CM | POA: Diagnosis not present

## 2023-02-03 LAB — HM PAP SMEAR: HPV, high-risk: NEGATIVE

## 2023-03-13 ENCOUNTER — Ambulatory Visit: Payer: 59 | Admitting: Physician Assistant

## 2023-03-16 ENCOUNTER — Telehealth: Payer: Self-pay | Admitting: Physician Assistant

## 2023-03-16 NOTE — Telephone Encounter (Signed)
Patient is requesting to transfer from Dr. Casimiro Needle to [facility]. States she has several friends who are patients of [facility] and would like to transfer.  Please advise.

## 2023-03-16 NOTE — Telephone Encounter (Signed)
Ok with me 

## 2023-03-18 NOTE — Telephone Encounter (Signed)
Scheduled patient for 10/9.

## 2023-03-27 DIAGNOSIS — B3731 Acute candidiasis of vulva and vagina: Secondary | ICD-10-CM | POA: Diagnosis not present

## 2023-03-27 DIAGNOSIS — N760 Acute vaginitis: Secondary | ICD-10-CM | POA: Diagnosis not present

## 2023-03-27 DIAGNOSIS — B372 Candidiasis of skin and nail: Secondary | ICD-10-CM | POA: Diagnosis not present

## 2023-04-08 ENCOUNTER — Ambulatory Visit: Payer: 59 | Admitting: Physician Assistant

## 2023-04-08 ENCOUNTER — Encounter: Payer: Self-pay | Admitting: Physician Assistant

## 2023-04-08 VITALS — BP 108/72 | HR 81 | Temp 97.1°F | Ht 67.72 in | Wt 192.2 lb

## 2023-04-08 DIAGNOSIS — R6889 Other general symptoms and signs: Secondary | ICD-10-CM

## 2023-04-08 DIAGNOSIS — M542 Cervicalgia: Secondary | ICD-10-CM

## 2023-04-08 DIAGNOSIS — D6859 Other primary thrombophilia: Secondary | ICD-10-CM | POA: Insufficient documentation

## 2023-04-08 DIAGNOSIS — Q51810 Arcuate uterus: Secondary | ICD-10-CM | POA: Insufficient documentation

## 2023-04-08 DIAGNOSIS — B977 Papillomavirus as the cause of diseases classified elsewhere: Secondary | ICD-10-CM | POA: Insufficient documentation

## 2023-04-08 DIAGNOSIS — Z23 Encounter for immunization: Secondary | ICD-10-CM | POA: Diagnosis not present

## 2023-04-08 DIAGNOSIS — O418X90 Other specified disorders of amniotic fluid and membranes, unspecified trimester, not applicable or unspecified: Secondary | ICD-10-CM | POA: Insufficient documentation

## 2023-04-08 NOTE — Patient Instructions (Signed)
Please contact your insurance company to see if they approve of any weight loss medications.  You can then notify us of what they will cover and we can try send in, however we cannot guarantee coverage or availability.   Some plans unfortunately do not cover for any obesity medications.   Our office currently does not have the time or staff dedicated to resubmit multiple prior authorizations and appeals for these medications at this time. I'm hoping that the landscape for these medications change soon, but as of right now, this is out of our hands. Please let me know and I will do what I can to help.

## 2023-04-08 NOTE — Progress Notes (Signed)
Subjective:    Navarro ID: Kelly Navarro, female    DOB: 1986/04/01, 37 y.o.   MRN: [id number]  Chief Complaint  Navarro presents with   Transitions Of Care    Former Dr Casimiro Needle pt in office to establish care with PCP; pt is due for annual CPE and fasting labs; pt is not fasting for this appt; pt has concerns with weight and weight loss; also having some neck pain and headaches; pt is a Navarro at [facility] and they manage medication for Kelly; requested past records from [facility] and also current GYN at [facility]    HPI Kelly Navarro presents today for new Navarro establishment with me.  Navarro was previously established with Dr. Casimiro Needle. Works as a Doctor, general practice.  Two daughters at home 6 & 3; going through separation / divorce as well.   Current Care Team: [facility] therapy every other week (same one x 4 years)  [facility]    Concerns: Weight - struggling to lose despite good intentions. 4-5x/wk exercising at [employer] - weights / HIIT; good stress relief; does well during the workout no issues; 12 lbs down from last year. Has struggled since having Kelly Navarro.  Also having some posterior neck pain /spasms. Would like to have PT / dry needling assess.    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   Eczema    Gaucher disease, type I (HCC)    noted on [facility] genetic testing 05/2015   GERD (gastroesophageal reflux disease)    Headache(784.0)    menstrual    IBS (irritable bowel syndrome)    Incomplete miscarriage 02/22/2019   Scoliosis    very mild    Past Surgical History:  Procedure Laterality Date   CESAREAN SECTION N/A 10/21/2016    Procedure: CESAREAN SECTION;  Surgeon: Mitchel Honour, DO;  Location: [facility];  Service: Obstetrics;  Laterality: N/A;   CESAREAN SECTION N/A 03/22/2020   Procedure: CESAREAN SECTION;  Surgeon: Noland Fordyce, MD;  Location: [facility];  Service: Obstetrics;  Laterality: N/A;   DILATION AND EVACUATION N/A 02/22/2019   Procedure: DILATATION AND EVACUATION;  Surgeon: Marcelle Overlie, MD;  Location: [facility] OR;  Service: Gynecology;  Laterality: N/A;   GUM SURGERY     OTHER SURGICAL HISTORY  2020   septate uterus, polyp   WISDOM TOOTH EXTRACTION      Family History  Problem Relation Age of Onset   Hypertension Mother    Non-Hodgkin's lymphoma Mother        currently in remission   Healthy Father    Anxiety disorder Sister    Depression Sister    Asthma Sister    Eczema Sister    Diabetes Maternal Grandfather    Hypertension Maternal Grandfather    Heart disease Maternal Grandfather    Breast cancer Paternal Grandmother        Age 27   Cancer Paternal Grandfather        multiple myeloma   Mood Disorder Paternal Grandfather     Social History   Tobacco Use   Smoking status: Never   Smokeless tobacco: Never  Vaping Use   Vaping status: Never Used  Substance  Use Topics   Alcohol use: Not Currently    Alcohol/week: 1.0 standard drink of alcohol    Types: 1 Standard drinks or equivalent per week    Comment: Occasionally   Drug use: No     Allergies  Allergen Reactions   Amoxicillin Hives    Has Navarro had a PCN reaction causing immediate rash, facial/tongue/throat swelling, SOB or lightheadedness with hypotension: Yes Has Navarro had a PCN reaction causing severe rash involving mucus membranes or skin necrosis: No Has Navarro had a PCN reaction that required hospitalization No Has Navarro had a PCN reaction occurring within the last 10 years: No If all of the above answers are "NO", then may proceed with Cephalosporin use.   Bee Venom Other (See Comments)     Tongue swelling   Macrodantin [Nitrofurantoin Macrocrystal] Nausea And Vomiting   Nitrofurantoin Other (See Comments)   Penicillins Hives    Has Navarro had a PCN reaction causing immediate rash, facial/tongue/throat swelling, SOB or lightheadedness with hypotension: Yes Has Navarro had a PCN reaction causing severe rash involving mucus membranes or skin necrosis: No Has Navarro had a PCN reaction that required hospitalization No Has Navarro had a PCN reaction occurring within the last 10 years: No If all of the above answers are "NO", then may proceed with Cephalosporin use.   Pineapple     Itchy throat   Sulfa Antibiotics Hives    Review of Systems NEGATIVE UNLESS OTHERWISE INDICATED IN HPI      Objective:     BP 108/72 (BP Location: Left Arm)   Pulse 81   Temp (!) 97.1 F (36.2 C) (Temporal)   Ht 5' 7.72" (1.72 m)   Wt 192 lb 3.2 oz (87.2 kg)   LMP 03/19/2023 (Exact Date)   SpO2 98%   BMI 29.47 kg/m   Wt Readings from Last 3 Encounters:  04/08/23 192 lb 3.2 oz (87.2 kg)  05/26/22 204 lb 11.2 oz (92.9 kg)  04/14/22 201 lb (91.2 kg)    BP Readings from Last 3 Encounters:  04/08/23 108/72  05/26/22 98/68  04/14/22 102/68     Physical Exam Vitals and nursing note reviewed.  Constitutional:      Appearance: Normal appearance.  Neck:     Comments: Slight posterior dorsal pad noted Cardiovascular:     Rate and Rhythm: Normal rate and regular rhythm.     Pulses: Normal pulses.     Heart sounds: Normal heart sounds. No murmur heard. Pulmonary:     Effort: Pulmonary effort is normal.     Breath sounds: Normal breath sounds.  Musculoskeletal:     Cervical back: Full passive range of motion without pain. No torticollis. Muscular tenderness present. No spinous process tenderness. Normal range of motion.  Neurological:     General: No focal deficit present.     Mental Status: She is alert and oriented to person, place, and time.  Psychiatric:        Mood and  Affect: Mood normal.        Behavior: Behavior normal.        Assessment & Plan:  Posterior neck pain -     Ambulatory referral to Physical Therapy  Difficulty maintaining weight loss  Immunization due -     Flu vaccine trivalent PF, 6mos and older(Flulaval,Afluria,Fluarix,Fluzone)   Pleasant new pt establishing care / transferring care to me.  Discussed weight loss - considering GLP1 agonists. She will reach out to Kelly insurance and get back with  me on coverage. Considering Wegovy or Zepbound. She's doing great with lifestyle. Look into personal training / nutritionist.  Referral to PT for eval / treatment of neck pain. Consider imaging / sports med prn.      Return in about 3 months (around 07/09/2023) for physical, fasting labs .   Deniah Saia M Champion Corales, PA-C

## 2023-05-13 ENCOUNTER — Encounter: Payer: Self-pay | Admitting: Psychiatry

## 2023-06-18 ENCOUNTER — Ambulatory Visit (INDEPENDENT_AMBULATORY_CARE_PROVIDER_SITE_OTHER): Payer: 59 | Admitting: Psychiatry

## 2023-06-18 ENCOUNTER — Encounter: Payer: Self-pay | Admitting: Psychiatry

## 2023-06-18 DIAGNOSIS — F419 Anxiety disorder, unspecified: Secondary | ICD-10-CM

## 2023-06-18 DIAGNOSIS — F4323 Adjustment disorder with mixed anxiety and depressed mood: Secondary | ICD-10-CM

## 2023-06-18 DIAGNOSIS — G4700 Insomnia, unspecified: Secondary | ICD-10-CM

## 2023-06-18 MED ORDER — HYDROXYZINE HCL 25 MG PO TABS: 25.0000 mg | ORAL_TABLET | Freq: Three times a day (TID) | ORAL | 2 refills | Status: AC | PRN

## 2023-06-18 MED ORDER — BUSPIRONE HCL 30 MG PO TABS: 30.0000 mg | ORAL_TABLET | Freq: Two times a day (BID) | ORAL | 1 refills | Status: DC

## 2023-06-18 MED ORDER — ALPRAZOLAM 0.5 MG PO TABS: ORAL_TABLET | ORAL | 5 refills | Status: AC

## 2023-06-18 MED ORDER — FLUOXETINE HCL 20 MG PO CAPS: ORAL_CAPSULE | ORAL | 1 refills | Status: DC

## 2023-06-18 NOTE — Progress Notes (Signed)
Kelly Navarro [id number] 1985-10-28 37 y.o.  Subjective:   Patient ID:  Kelly Navarro is a 37 y.o. (DOB 04-15-1986) female.  Chief Complaint:  Chief Complaint  Patient presents with   Anxiety   Follow-up    Depression    HPI [name] presents to the office today for follow-up of anxiety and depression. She denies persistent sad mood. She reports some situational sadness when she does not have her children. She reports that she does not have difficulty getting out of bed. She reports her energy and motivation are slightly lower.   She reports that her heart beats fast at times and she feels queasy when anxious. She reports that anxiety affects her more than depression. She reports that she had any nocturnal panic attacks since last visit. Denies any full blown panic attacks recently without shortness of breath. She notices some worry. She reports catastrophic thoughts and rumination have improved. Concentration has been ok. Appetite has been good. Denies SI.   She takes Hydroxyzine as needed. She thinks that Hydroxyzine may cause some bad dreams. She notices more vivid dreams in general. Taking about 2-3 times a week.   Supportive family and friends. Sees therapist regularly.   Taking Alprazolam prn infrequently (less than once a week).  Past Psychiatric Medication Trials: Xanax-effective Hydroxyzine- Helps with falling asleep. May cause weird dreams.  Buspar- Tried increasing Buspar to 15 mg TID and thinks it was helpful.  Prozac-No effect on 10 mg. Felt less anxious with 20 mg. Initially had some sexual side effects   GAD-7    Flowsheet Row Office Visit from 04/08/2023 in [facility] [place]  Total GAD-7 Score 5      PHQ2-9    Flowsheet Row Office Visit from 04/08/2023 in [facility] [place] Office Visit from 05/26/2022 in [facility] [employer] from 04/14/2022  in [facility] [place] Office Visit from 10/03/2021 in [facility] [place] Office Visit from 09/11/2021 in [facility] [place]  PHQ-2 Total Score 0 1 0 2 1  PHQ-9 Total Score 2 3 2 5 5       Flowsheet Row ED from 10/04/2021 in [facility] RISK CATEGORY No Risk        Review of Systems:  Review of Systems  Musculoskeletal:  Negative for gait problem.       Waking up with jaw pain  Neurological:  Positive for headaches.  Psychiatric/Behavioral:         Please refer to HPI    Medications: I have reviewed the patient's current medications.  Current Outpatient Medications  Medication Sig Dispense Refill   [START ON 06/20/2023] ALPRAZolam (XANAX) 0.5 MG tablet TAKE 1/2 TO 1 TABLET BY MOUTH AT BEDTIME AS NEEDED FOR ANXIETY 20 tablet 5   busPIRone (BUSPAR) 30 MG tablet Take 1 tablet (30 mg total) by mouth 2 (two) times daily. 180 tablet 1   FLUoxetine (PROZAC) 20 MG capsule Take 1-2 capsule daily 180 capsule 1   folic acid (FOLVITE) 1 MG tablet Take 1 mg by mouth daily. (Patient not taking: Reported on 06/18/2023)     hydrOXYzine (ATARAX) 25 MG tablet Take 1 tablet (25 mg total) by mouth 3 (three) times daily as needed for anxiety. 90 tablet 2   No current facility-administered medications for this visit.    Medication Side Effects:  None  Allergies:  Allergies  Allergen Reactions   Amoxicillin Hives    Has patient had a PCN reaction causing immediate rash, facial/tongue/throat swelling, SOB or lightheadedness with hypotension: Yes Has patient had a PCN reaction causing severe rash involving mucus membranes or skin necrosis: No Has patient had a PCN reaction that required hospitalization No Has patient had a PCN reaction occurring within the last 10 years: No If all of the above answers are "NO", then may proceed with Cephalosporin use.   Bee Venom Other (See Comments)    Tongue  swelling   Macrodantin [Nitrofurantoin Macrocrystal] Nausea And Vomiting   Nitrofurantoin Other (See Comments)   Penicillins Hives    Has patient had a PCN reaction causing immediate rash, facial/tongue/throat swelling, SOB or lightheadedness with hypotension: Yes Has patient had a PCN reaction causing severe rash involving mucus membranes or skin necrosis: No Has patient had a PCN reaction that required hospitalization No Has patient had a PCN reaction occurring within the last 10 years: No If all of the above answers are "NO", then may proceed with Cephalosporin use.   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   Please see review of systems for further details on the patient's review from today.   Objective:   Physical Exam:  There were no vitals taken for this visit.  Physical Exam Constitutional:      General: She is not in acute distress. Musculoskeletal:        General: No deformity.  Neurological:     Mental Status: She is alert and oriented to person, place, and time.     Coordination: Coordination normal.  Psychiatric:        Attention and Perception: Attention and perception normal. She does not perceive auditory or visual hallucinations.        Mood and Affect:  Mood is anxious. Mood is not depressed. Affect is not labile, blunt, angry or inappropriate.        Speech: Speech normal.        Behavior: Behavior normal.        Thought Content: Thought content normal. Thought content is not paranoid or delusional. Thought content does not include homicidal or suicidal ideation. Thought content does not include homicidal or suicidal plan.        Cognition and Memory: Cognition and memory normal.        Judgment: Judgment normal.     Comments: Insight intact     Lab Review:     Component Value Date/Time   NA 137 10/04/2021 1200   K 3.8 10/04/2021 1200   CL 105 10/04/2021 1200   CO2 25 10/04/2021 1200   GLUCOSE 97 10/04/2021 1200   BUN 6  10/04/2021 1200   CREATININE 0.71 10/04/2021 1200   CREATININE 0.76 11/08/2014 1603   CALCIUM 9.2 10/04/2021 1200   PROT 8.7 (H) 10/04/2021 1200   ALBUMIN 3.9 10/04/2021 1200   AST 18 10/04/2021 1200   ALT 16 10/04/2021 1200   ALKPHOS 76 10/04/2021 1200   BILITOT 0.6 10/04/2021 1200   GFRNONAA >60 10/04/2021 1200   GFRNONAA >89 11/08/2014 1603   GFRAA >60 08/05/2016 1326   GFRAA >89 11/08/2014 1603       Component Value Date/Time   WBC 7.3 10/04/2021 1200   RBC 4.29 10/04/2021 1200   HGB 12.1 10/04/2021 1200   HCT 36.9 10/04/2021 1200   PLT 193 10/04/2021 1200   MCV 86.0 10/04/2021 1200   MCH 28.2 10/04/2021 1200   MCHC 32.8 10/04/2021 1200   RDW 13.4 10/04/2021 1200   LYMPHSABS 1.2 10/04/2021 1200   MONOABS 0.8 10/04/2021 1200   EOSABS 0.0 10/04/2021 1200   BASOSABS 0.0 10/04/2021 1200    No results found for: "POCLITH", "LITHIUM"   No results found for: "PHENYTOIN", "PHENOBARB", "VALPROATE", "CBMZ"   .res Assessment: Plan:    29 minutes spent dedicated to the care of this patient on the date of this encounter to include pre-visit review of records, ordering of medication, post visit documentation, and face-to-face time with the patient discussing potential benefits, risks, and side effects  of increasing Prozac from 20 mg to 40 mg to improve residual anxiety symptoms. Discussed that script can be written for 20 mg 1-2 capsules daily and that she can decrease dose back to 20 mg daily if side effects occur. Pt agrees to trial of Prozac 40 mg daily.  Continue Buspar 30 mg po BID for anxiety.  Continue Alprazolam 0.5 mg 1/2-1 tablet at bedtime as needed for anxiety.  Continue Hydroxyzine 25 mg as needed for anxiety or insomnia.  Recommend continuing psychotherapy.  Pt to follow-up in 6 months or sooner if clinically indicated.  Patient advised to contact office with any questions, adverse effects, or acute worsening in signs and symptoms.   Pretty was seen today for anxiety and follow-up.  Diagnoses and all orders for this visit:  Adjustment disorder with mixed anxiety and depressed mood -     FLUoxetine (PROZAC) 20 MG capsule; Take 1-2 capsule daily -     busPIRone (BUSPAR) 30 MG tablet; Take 1 tablet (30 mg total) by mouth 2 (two) times daily. -     ALPRAZolam (XANAX) 0.5 MG tablet; TAKE 1/2 TO 1 TABLET BY MOUTH AT BEDTIME AS NEEDED FOR ANXIETY  Anxiety -     hydrOXYzine (ATARAX) 25 MG tablet; Take 1 tablet (25 mg total) by mouth 3 (three) times daily as needed for anxiety.  Insomnia, unspecified type -     hydrOXYzine (ATARAX) 25 MG tablet; Take 1 tablet (25 mg total) by mouth 3 (three) times daily as needed for anxiety.     Please see After Visit Summary for patient specific instructions.  Future Appointments  Date Time Provider Department Center  07/17/2023  8:00 AM Allwardt, Crist Infante, PA-C [facility]  12/17/2023 10:00 AM Hurst, Rosey Bath T, PA-C [facility] None    No orders of the defined types were placed in this encounter.   -------------------------------

## 2023-07-17 ENCOUNTER — Ambulatory Visit (INDEPENDENT_AMBULATORY_CARE_PROVIDER_SITE_OTHER): Payer: 59 | Admitting: Physician Assistant

## 2023-07-17 ENCOUNTER — Encounter: Payer: Self-pay | Admitting: Physician Assistant

## 2023-07-17 VITALS — BP 108/74 | HR 74 | Temp 97.3°F | Ht 67.72 in | Wt 199.2 lb

## 2023-07-17 DIAGNOSIS — E559 Vitamin D deficiency, unspecified: Secondary | ICD-10-CM

## 2023-07-17 DIAGNOSIS — N840 Polyp of corpus uteri: Secondary | ICD-10-CM | POA: Insufficient documentation

## 2023-07-17 DIAGNOSIS — L309 Dermatitis, unspecified: Secondary | ICD-10-CM | POA: Diagnosis not present

## 2023-07-17 DIAGNOSIS — Z8759 Personal history of other complications of pregnancy, childbirth and the puerperium: Secondary | ICD-10-CM | POA: Insufficient documentation

## 2023-07-17 DIAGNOSIS — Z131 Encounter for screening for diabetes mellitus: Secondary | ICD-10-CM

## 2023-07-17 DIAGNOSIS — Z0000 Encounter for general adult medical examination without abnormal findings: Secondary | ICD-10-CM | POA: Diagnosis not present

## 2023-07-17 DIAGNOSIS — R5383 Other fatigue: Secondary | ICD-10-CM | POA: Diagnosis not present

## 2023-07-17 LAB — IBC + FERRITIN
Ferritin: 7.3 ng/mL — ABNORMAL LOW (ref 10.0–291.0)
Iron: 91 ug/dL (ref 42–145)
Saturation Ratios: 20.7 % (ref 20.0–50.0)
TIBC: 439.6 ug/dL (ref 250.0–450.0)
Transferrin: 314 mg/dL (ref 212.0–360.0)

## 2023-07-17 LAB — COMPREHENSIVE METABOLIC PANEL
ALT: 12 U/L (ref 0–35)
AST: 15 U/L (ref 0–37)
Albumin: 4.6 g/dL (ref 3.5–5.2)
Alkaline Phosphatase: 81 U/L (ref 39–117)
BUN: 12 mg/dL (ref 6–23)
CO2: 28 meq/L (ref 19–32)
Calcium: 9.5 mg/dL (ref 8.4–10.5)
Chloride: 103 meq/L (ref 96–112)
Creatinine, Ser: 0.74 mg/dL (ref 0.40–1.20)
GFR: 102.97 mL/min (ref >60.00)
Glucose, Bld: 92 mg/dL (ref 70–99)
Potassium: 5.1 meq/L (ref 3.5–5.1)
Sodium: 137 meq/L (ref 135–145)
Total Bilirubin: 0.5 mg/dL (ref 0.2–1.2)
Total Protein: 7.6 g/dL (ref 6.0–8.3)

## 2023-07-17 LAB — CBC WITH DIFFERENTIAL/PLATELET
Basophils Absolute: 0.1 10*3/uL (ref 0.0–0.1)
Basophils Relative: 1.2 % (ref 0.0–3.0)
Eosinophils Absolute: 0.2 10*3/uL (ref 0.0–0.7)
Eosinophils Relative: 3.4 % (ref 0.0–5.0)
HCT: 36 % (ref 36.0–46.0)
Hemoglobin: 11.8 g/dL — ABNORMAL LOW (ref 12.0–15.0)
Lymphocytes Relative: 31.4 % (ref 12.0–46.0)
Lymphs Abs: 1.8 10*3/uL (ref 0.7–4.0)
MCHC: 32.9 g/dL (ref 30.0–36.0)
MCV: 85.4 fL (ref 78.0–100.0)
Monocytes Absolute: 0.4 10*3/uL (ref 0.1–1.0)
Monocytes Relative: 6.3 % (ref 3.0–12.0)
Neutro Abs: 3.3 10*3/uL (ref 1.4–7.7)
Neutrophils Relative %: 57.7 % (ref 43.0–77.0)
Platelets: 276 10*3/uL (ref 150.0–400.0)
RBC: 4.22 Mil/uL (ref 3.87–5.11)
RDW: 14.1 % (ref 11.5–15.5)
WBC: 5.8 10*3/uL (ref 4.0–10.5)

## 2023-07-17 LAB — LIPID PANEL
Cholesterol: 117 mg/dL (ref 0–200)
HDL: 42.5 mg/dL (ref >39.00)
LDL Cholesterol: 51 mg/dL (ref 0–99)
NonHDL: 74.18
Total CHOL/HDL Ratio: 3
Triglycerides: 114 mg/dL (ref 0.0–149.0)
VLDL: 22.8 mg/dL (ref 0.0–40.0)

## 2023-07-17 LAB — VITAMIN B12: Vitamin B-12: 276 pg/mL (ref 211–911)

## 2023-07-17 LAB — VITAMIN D 25 HYDROXY (VIT D DEFICIENCY, FRACTURES): VITD: 21.42 ng/mL — ABNORMAL LOW (ref 30.00–100.00)

## 2023-07-17 LAB — HEMOGLOBIN A1C: Hgb A1c MFr Bld: 5.5 % (ref 4.6–6.5)

## 2023-07-17 LAB — TSH: TSH: 1.09 u[IU]/mL (ref 0.35–5.50)

## 2023-07-17 MED ORDER — HYDROCORTISONE 2.5 % EX CREA: TOPICAL_CREAM | Freq: Two times a day (BID) | CUTANEOUS | 0 refills | Status: DC

## 2023-07-17 NOTE — Patient Instructions (Addendum)
Look into Phentermine (Adipex) x 3 months, or could talk to psychiatrist about Wellbutrin in addition to the Prozac - message me with thoughts   Look into nutritionists:  [email or web address]  Vit D  1000 to 2000 international unit daily  B12 1000 to 2000 mcg daily Iron - usually slow release 65 mg daily, take with Vit C for absorption

## 2023-07-17 NOTE — Progress Notes (Unsigned)
Patient ID: Kelly Navarro, female    DOB: Oct 16, 1985, 38 y.o.   MRN: [id number]   Assessment & Plan:  Annual physical exam -     CBC with Differential/Platelet -     Comprehensive metabolic panel -     Hemoglobin A1c -     Lipid panel -     TSH -     Vitamin B12 -     VITAMIN D 25 Hydroxy (Vit-D Deficiency, Fractures) -     IBC + Ferritin  Vitamin D deficiency -     VITAMIN D 25 Hydroxy (Vit-D Deficiency, Fractures)  Other fatigue -     Comprehensive metabolic panel -     TSH -     Vitamin B12 -     VITAMIN D 25 Hydroxy (Vit-D Deficiency, Fractures) -     IBC + Ferritin  Eczema, unspecified type -     Hydrocortisone; Apply topically 2 (two) times daily.  Dispense: 45 g; Refill: 0   Age-appropriate screening and counseling performed today. Will check labs and call with results. Preventive measures discussed and printed in AVS for patient.   Patient Counseling: [x]   Nutrition: Stressed importance of moderation in sodium/caffeine intake, saturated fat and cholesterol, caloric balance, sufficient intake of fresh fruits, vegetables, and fiber.  [x]   Stressed the importance of regular exercise.   []   Substance Abuse: Discussed cessation/primary prevention of tobacco, alcohol, or other drug use; driving or other dangerous activities under the influence; availability of treatment for abuse.   []   Injury prevention: Discussed safety belts, safety helmets, smoke detector, smoking near bedding or upholstery.   []   Sexuality: Discussed sexually transmitted diseases, partner selection, use of condoms, avoidance of unintended pregnancy  and contraceptive alternatives.   [x]   Dental health: Discussed importance of regular tooth brushing, flossing, and dental visits.  [x]   Health maintenance and immunizations reviewed. Please refer to Health maintenance section.        Eczema Flare-ups noted on neck and back. Previously managed with prescription compound cream, currently using  over-the-counter hydrocortisone. -Prescribe Triamcinolone cream  -Consider dermatology appointment if no improvement.  Fatigue Reports feeling tired despite adequate sleep. History of low iron and vitamin D. -Check iron, vitamin D, and B12 levels. -Consider starting multivitamin with iron, vitamin D, and B12.  Weight Management Reports difficulty losing weight despite regular exercise and healthy diet. History of using Weight Watchers with some success. -Consider trial of Phentermine for 30 days, monitor heart rate and blood pressure. -Consider adding Wellbutrin to current Prozac regimen, discuss with psychiatrist. -Consider referral to nutritionist.  General Health Maintenance Up to date with Pap smear, vaccines, and dermatology checks. Colon cancer screening to start at age 22. -Continue current preventive care regimen.       Subjective:    Chief Complaint  Patient presents with   Annual Exam    Pt in office for annual CPE and fasting labs; Pt c/o rash she wanted to discuss; weight loss options    HPI  The patient, a 38 year old with a history of eczema and fatigue, presents for a routine physical examination. The patient reports feeling tired despite getting adequate sleep and has concerns about weight loss. The patient exercises regularly and maintains a generally healthy diet but has not seen desired weight loss results. The patient reports feeling hungry frequently and is considering the use of weight loss medications. The patient also reports an increase in eczema flare-ups, particularly in the winter months,  and is seeking a prescription for a stronger hydrocortisone cream. The patient is currently on 20mg  of Prozac and is considering increasing the dosage to 40mg . The patient is also dealing with stress from a recent divorce and the challenges of being a single parent.   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   Eczema    Gaucher disease, type I (HCC)    noted on [facility] genetic testing 05/2015   GERD (gastroesophageal reflux disease)    Headache(784.0)    menstrual    IBS (irritable bowel syndrome)    Incomplete miscarriage 02/22/2019   Scoliosis    very mild    Past Surgical History:  Procedure Laterality Date   CESAREAN SECTION N/A 10/21/2016   Procedure: CESAREAN SECTION;  Surgeon: Mitchel Honour, DO;  Location: [facility];  Service: Obstetrics;  Laterality: N/A;   CESAREAN SECTION N/A 03/22/2020   Procedure: CESAREAN SECTION;  Surgeon: Noland Fordyce, MD;  Location: [facility];  Service: Obstetrics;  Laterality: N/A;   DILATION AND EVACUATION N/A 02/22/2019   Procedure: DILATATION AND EVACUATION;  Surgeon: Marcelle Overlie, MD;  Location: [facility] OR;  Service: Gynecology;  Laterality: N/A;   GUM SURGERY     OTHER SURGICAL HISTORY  2020   septate uterus, polyp   WISDOM TOOTH EXTRACTION      Family History  Problem Relation Age of Onset   Hypertension Mother    Non-Hodgkin's lymphoma Mother        currently in remission   Healthy Father    Anxiety disorder Sister    Depression Sister    Asthma Sister    Eczema Sister    Diabetes Maternal Grandfather    Hypertension Maternal Grandfather    Heart disease Maternal Grandfather    Breast cancer Paternal Grandmother        Age 8   Cancer Paternal Grandfather        multiple myeloma   Mood Disorder Paternal Grandfather     Social History   Tobacco Use   Smoking status: Never   Smokeless tobacco: Never  Vaping Use   Vaping status: Never Used  Substance Use Topics   Alcohol use: Not Currently    Alcohol/week: 1.0 standard drink  of alcohol    Types: 1 Standard drinks or equivalent per week    Comment: Occasionally   Drug use: No     Allergies  Allergen Reactions   Amoxicillin Hives    Has patient had a PCN reaction causing immediate rash, facial/tongue/throat swelling, SOB or lightheadedness with hypotension: Yes Has patient had a PCN reaction causing severe rash involving mucus membranes or skin necrosis: No Has patient had a PCN reaction that required hospitalization No Has patient had a PCN reaction occurring within the last 10 years: No If all of the above answers are "NO", then may proceed with Cephalosporin use.   Bee Venom Other (See Comments)    Tongue swelling   Macrodantin [Nitrofurantoin Macrocrystal] Nausea And Vomiting   Nitrofurantoin Other (See Comments) and Nausea Only   Penicillins Hives    Has patient had a PCN reaction causing immediate rash, facial/tongue/throat swelling, SOB or lightheadedness with  hypotension: Yes  Has patient had a PCN reaction causing severe rash involving mucus membranes or skin necrosis: No  Has patient had a PCN reaction that required hospitalization No  Has patient had a PCN reaction occurring within the last 10 years: No  If all of the above answers are "NO", then may proceed with Cephalosporin use.  Has patient had a PCN reaction causing immediate rash, facial/tongue/throat swelling, SOB or lightheadedness with hypotension: Yes  Has patient had a PCN reaction causing severe rash involving mucus membranes or skin necrosis: No  Has patient had a PCN reaction that required hospitalization No  Has patient had a PCN reaction occurring within the last 10 years: No  If all of the above answers are "NO", then may proceed with Cephalosporin use.  Has patient had a PCN reaction causing immediate rash, facial/tongue/throat swelling, SOB or lightheadedness with hypotension: Yes  Has patient had a PCN reaction causing severe rash involving mucus membranes or skin necrosis:  No  Has patient had a PCN reaction that required hospitalization No  Has patient had a PCN reaction occurring within the last 10 years: No  If all of the above answers are "NO", then may proceed with Cephalosporin use.   Pineapple Swelling    Itchy throat  Itchy throat  Itchy throat   Sulfa Antibiotics Hives    Review of Systems NEGATIVE UNLESS OTHERWISE INDICATED IN HPI      Objective:     BP 108/74 (BP Location: Left Arm, Patient Position: Sitting)   Pulse 74   Temp (!) 97.3 F (36.3 C) (Temporal)   Ht 5' 7.72" (1.72 m)   Wt 199 lb 3.2 oz (90.4 kg)   SpO2 98%   BMI 30.54 kg/m   Wt Readings from Last 3 Encounters:  07/17/23 199 lb 3.2 oz (90.4 kg)  04/08/23 192 lb 3.2 oz (87.2 kg)  05/26/22 204 lb 11.2 oz (92.9 kg)    BP Readings from Last 3 Encounters:  07/17/23 108/74  04/08/23 108/72  05/26/22 98/68     Physical Exam Vitals and nursing note reviewed.  Constitutional:      Appearance: Normal appearance. She is obese. She is not toxic-appearing.  HENT:     Head: Normocephalic and atraumatic.     Right Ear: Tympanic membrane, ear canal and external ear normal.     Left Ear: Tympanic membrane, ear canal and external ear normal.     Nose: Nose normal.     Mouth/Throat:     Mouth: Mucous membranes are moist.  Eyes:     Extraocular Movements: Extraocular movements intact.     Conjunctiva/sclera: Conjunctivae normal.     Pupils: Pupils are equal, round, and reactive to light.  Cardiovascular:     Rate and Rhythm: Normal rate and regular rhythm.     Pulses: Normal pulses.     Heart sounds: Normal heart sounds.  Pulmonary:     Effort: Pulmonary effort is normal.     Breath sounds: Normal breath sounds.  Abdominal:     General: Abdomen is flat. Bowel sounds are normal.     Palpations: Abdomen is soft.  Musculoskeletal:        General: Normal range of motion.     Cervical back: Normal range of motion and neck supple.  Skin:    General: Skin is warm and  dry.     Findings: Rash (small patches eczema noted) present.  Neurological:     General: No focal deficit present.  Mental Status: She is alert and oriented to person, place, and time.  Psychiatric:        Mood and Affect: Mood normal.        Behavior: Behavior normal.        Thought Content: Thought content normal.        Judgment: Judgment normal.        Mckayla Mulcahey M Wahneta Derocher, PA-C

## 2023-07-24 ENCOUNTER — Other Ambulatory Visit: Payer: Self-pay

## 2023-07-24 ENCOUNTER — Encounter: Payer: Self-pay | Admitting: Physician Assistant

## 2023-07-24 DIAGNOSIS — E559 Vitamin D deficiency, unspecified: Secondary | ICD-10-CM

## 2023-07-24 MED ORDER — VITAMIN D (ERGOCALCIFEROL) 1.25 MG (50000 UNIT) PO CAPS: 50000.0000 [IU] | ORAL_CAPSULE | ORAL | 0 refills | Status: DC

## 2023-07-24 NOTE — Telephone Encounter (Signed)
Please see pt response to lab result note; Vitamin D Rx sent to patient pharmacy

## 2023-08-06 ENCOUNTER — Encounter: Payer: Self-pay | Admitting: Physician Assistant

## 2023-08-06 NOTE — Telephone Encounter (Signed)
 Please see pt msg and advise on Rx

## 2023-08-07 ENCOUNTER — Other Ambulatory Visit: Payer: Self-pay | Admitting: Physician Assistant

## 2023-08-07 MED ORDER — PHENTERMINE HCL 37.5 MG PO TABS: 37.5000 mg | ORAL_TABLET | Freq: Every day | ORAL | 0 refills | Status: DC

## 2023-08-13 ENCOUNTER — Other Ambulatory Visit: Payer: Self-pay | Admitting: Physician Assistant

## 2023-08-13 DIAGNOSIS — L309 Dermatitis, unspecified: Secondary | ICD-10-CM

## 2023-08-16 ENCOUNTER — Encounter: Payer: Self-pay | Admitting: Physician Assistant

## 2023-08-17 NOTE — Telephone Encounter (Signed)
Please see patient message and advise.

## 2023-08-18 ENCOUNTER — Telehealth: Payer: 59 | Admitting: Physician Assistant

## 2023-08-18 DIAGNOSIS — Z20828 Contact with and (suspected) exposure to other viral communicable diseases: Secondary | ICD-10-CM | POA: Diagnosis not present

## 2023-08-18 MED ORDER — XOFLUZA (80 MG DOSE) 1 X 80 MG PO TBPK: ORAL_TABLET | ORAL | 0 refills | Status: DC

## 2023-08-18 MED ORDER — OSELTAMIVIR PHOSPHATE 75 MG PO CAPS
75.0000 mg | ORAL_CAPSULE | Freq: Every day | ORAL | 0 refills | Status: DC
Start: 2023-08-18 — End: 2023-09-16

## 2023-08-18 NOTE — Addendum Note (Signed)
 Addended by: Margaretann Loveless on: 08/18/2023 03:34 PM   Modules accepted: Orders

## 2023-08-18 NOTE — Progress Notes (Signed)
 I have spent 5 minutes in review of e-visit questionnaire, review and updating patient chart, medical decision making and response to patient.   Piedad Climes, PA-C

## 2023-08-18 NOTE — Progress Notes (Signed)
 E visit for Flu like symptoms   We are sorry that you are not feeling well.  Here is how we plan to help! Based on what you have shared with me it looks like you may have possible exposure to a virus that causes influenza.  Influenza or "the flu" is   an infection caused by a respiratory virus. The flu virus is highly contagious and persons who did not receive their yearly flu vaccination may "catch" the flu from close contact.  We have anti-viral medications to treat the viruses that cause this infection. They are not a "cure" and only shorten the course of the infection. These prescriptions are most effective when they are given within the first 2 days of "flu" symptoms. Antiviral medication are indicated if you have a high risk of complications from the flu. You should  also consider an antiviral medication if you are in close contact with someone who is at risk. These medications can help patients avoid complications from the flu  but have side effects that you should know.  Based upon your exposures and potential risk factors I have prescribed a preventive dose of xofluza to take as directed.    ANYONE WHO HAS FLU SYMPTOMS SHOULD: Stay home. The flu is highly contagious and going out or to work exposes others! Be sure to drink plenty of fluids. Water is fine as well as fruit juices, sodas and electrolyte beverages. You may want to stay away from caffeine or alcohol. If you are nauseated, try taking small sips of liquids. How do you know if you are getting enough fluid? Your urine should be a pale yellow or almost colorless. Get rest. Taking a steamy shower or using a humidifier may help nasal congestion and ease sore throat pain. Using a saline nasal spray works much the same way. Cough drops, hard candies and sore throat lozenges may ease your cough. Line up a caregiver. Have someone check on you regularly.   GET HELP RIGHT AWAY IF: You cannot keep down liquids or your medications. You  become short of breath Your fell like you are going to pass out or loose consciousness. Your symptoms persist after you have completed your treatment plan MAKE SURE YOU  Understand these instructions. Will watch your condition. Will get help right away if you are not doing well or get worse.  Your e-visit answers were reviewed by a board certified advanced clinical practitioner to complete your personal care plan.  Depending on the condition, your plan could have included both over the counter or prescription medications.  If there is a problem please reply  once you have received a response from your provider.  Your safety is important to [place].  If you have drug allergies check your prescription carefully.    You can use MyChart to ask questions about today's visit, request a non-urgent call back, or ask for a work or school excuse for 24 hours related to this e-Visit. If it has been greater than 24 hours you will need to follow up with your provider, or enter a new e-Visit to address those concerns.  You will get an e-mail in the next two days asking about your experience.  I hope that your e-visit has been valuable and will speed your recovery. Thank you for using e-visits.

## 2023-09-14 ENCOUNTER — Encounter: Payer: Self-pay | Admitting: Physician Assistant

## 2023-09-15 ENCOUNTER — Encounter: Payer: Self-pay | Admitting: Physician Assistant

## 2023-09-16 ENCOUNTER — Telehealth (INDEPENDENT_AMBULATORY_CARE_PROVIDER_SITE_OTHER): Admitting: Family

## 2023-09-16 ENCOUNTER — Encounter: Payer: Self-pay | Admitting: Family

## 2023-09-16 VITALS — Temp 98.0°F | Ht 67.72 in | Wt 190.0 lb

## 2023-09-16 DIAGNOSIS — U071 COVID-19: Secondary | ICD-10-CM

## 2023-09-16 DIAGNOSIS — R0981 Nasal congestion: Secondary | ICD-10-CM | POA: Diagnosis not present

## 2023-09-16 MED ORDER — FLUTICASONE PROPIONATE 50 MCG/ACT NA SUSP: NASAL | 1 refills | Status: DC

## 2023-09-16 MED ORDER — NIRMATRELVIR/RITONAVIR (PAXLOVID)TABLET: 3.0000 | ORAL_TABLET | Freq: Two times a day (BID) | ORAL | 0 refills | Status: AC

## 2023-09-16 NOTE — Progress Notes (Signed)
 MyChart Video Visit    Virtual Visit via Video Note   This format is felt to be most appropriate for this patient at this time. Physical exam was limited by quality of the video and audio technology used for the visit. CMA was able to get the patient set up on a video visit.  Patient location: Home. Patient and provider in visit Provider location: Office  I discussed the limitations of evaluation and management by telemedicine and the availability of in person appointments. The patient expressed understanding and agreed to proceed.  Visit Date: 09/16/2023  Today's healthcare provider: Dulce Sellar, NP     Subjective:   Patient ID: Kelly Navarro, female    DOB: 17-Feb-1986, 38 y.o.   MRN: [id number]  Chief Complaint  Patient presents with   Covid Positive    sx for 3 days   HPI: URI sx:  Pt tested positive yesterday at home. Sx include Nasal congestion, fatigue, headache ,body aches, chills and cough. Present since Sunday.Pt denies fever. Has tried ibuprofen and tylenol which has helped headaches.   Assessment & Plan:  COVID-19 - Sending Paxlovid, pt advised on use, how to take, & SE.  OK to continue taking OTC sinus or pain meds. Encouraged to monitor & notify office of any worsening symptoms: increased shortness of breath, weakness, and signs of dehydration. Instructed to rest and hydrate well.   -     nirmatrelvir/ritonavir; Take 3 tablets by mouth 2 (two) times daily for 5 days. (Take nirmatrelvir 150 mg two tablets twice daily for 5 days and ritonavir 100 mg one tablet twice daily for 5 days)  Dispense: 30 tablet; Refill: 0 -     Fluticasone Propionate; 1 spray each nostril twice a day for 2-3days, then reduce to 1 spray each nostril daily until sx are resolved. Can use daily or qod for any allergy symptoms.  Dispense: 16 g; Refill: 1  Congestion of nasal sinus - -     Fluticasone Propionate; 1 spray each nostril twice a day for 2-3days, then reduce to 1  spray each nostril daily until sx are resolved. Can use daily or qod for any allergy symptoms.  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   Eczema    Gaucher disease, type I (HCC)    noted on [facility] genetic testing 05/2015   GERD (gastroesophageal reflux disease)    Headache(784.0)    menstrual    IBS (irritable bowel syndrome)    Incomplete miscarriage 02/22/2019   Scoliosis    very mild    Past Surgical History:  Procedure Laterality Date   CESAREAN SECTION N/A 10/21/2016   Procedure: CESAREAN SECTION;  Surgeon: Mitchel Honour, DO;  Location: [facility];  Service: Obstetrics;  Laterality: N/A;   CESAREAN SECTION N/A 03/22/2020   Procedure: CESAREAN SECTION;  Surgeon: Noland Fordyce, MD;  Location: [facility];  Service: Obstetrics;  Laterality: N/A;   DILATION AND EVACUATION N/A 02/22/2019   Procedure: DILATATION AND EVACUATION;  Surgeon: Marcelle Overlie, MD;  Location: [facility] OR;  Service: Gynecology;  Laterality: N/A;   GUM SURGERY     OTHER SURGICAL HISTORY  2020   septate uterus, polyp  WISDOM TOOTH EXTRACTION      Outpatient Medications Prior to Visit  Medication Sig Dispense Refill   ALPRAZolam (XANAX) 0.5 MG tablet TAKE 1/2 TO 1 TABLET BY MOUTH AT BEDTIME AS NEEDED FOR ANXIETY 20 tablet 5   busPIRone (BUSPAR) 30 MG tablet Take 1 tablet (30 mg total) by mouth 2 (two) times daily. 180 tablet 1   FLUoxetine (PROZAC) 20 MG capsule Take 1-2 capsule daily 180 capsule 1   hydrocortisone 2.5 % cream Apply topically 2 (two) times daily. 45 g 0   hydrOXYzine (ATARAX) 25 MG tablet Take 1 tablet (25 mg total) by mouth 3 (three) times daily as needed for anxiety. 90 tablet 2    tranexamic acid (LYSTEDA) 650 MG TABS tablet      Vitamin D, Ergocalciferol, (DRISDOL) 1.25 MG (50000 UNIT) CAPS capsule Take 1 capsule (50,000 Units total) by mouth every 7 (seven) days. 12 capsule 0   folic acid (FOLVITE) 1 MG tablet Take 1 mg by mouth daily. (Patient not taking: Reported on 09/16/2023)     oseltamivir (TAMIFLU) 75 MG capsule Take 1 capsule (75 mg total) by mouth daily. (Patient not taking: Reported on 09/16/2023) 10 capsule 0   phentermine (ADIPEX-P) 37.5 MG tablet Take 1 tablet (37.5 mg total) by mouth daily before breakfast. (Patient not taking: Reported on 09/16/2023) 30 tablet 0   No facility-administered medications prior to visit.    Allergies  Allergen Reactions   Amoxicillin Hives    Has patient had a PCN reaction causing immediate rash, facial/tongue/throat swelling, SOB or lightheadedness with hypotension: Yes Has patient had a PCN reaction causing severe rash involving mucus membranes or skin necrosis: No Has patient had a PCN reaction that required hospitalization No Has patient had a PCN reaction occurring within the last 10 years: No If all of the above answers are "NO", then may proceed with Cephalosporin use.   Bee Venom Other (See Comments)    Tongue swelling   Macrodantin [Nitrofurantoin Macrocrystal] Nausea And Vomiting   Nitrofurantoin Other (See Comments) and Nausea Only   Penicillins Hives    Has patient had a PCN reaction causing immediate rash, facial/tongue/throat swelling, SOB or lightheadedness with hypotension: Yes  Has patient had a PCN reaction causing severe rash involving mucus membranes or skin necrosis: No  Has patient had a PCN reaction that required hospitalization No  Has patient had a PCN reaction occurring within the last 10 years: No  If all of the above answers are "NO", then may proceed with Cephalosporin use.  Has patient had a PCN reaction causing immediate rash, facial/tongue/throat swelling, SOB or lightheadedness with  hypotension: Yes  Has patient had a PCN reaction causing severe rash involving mucus membranes or skin necrosis: No  Has patient had a PCN reaction that required hospitalization No  Has patient had a PCN reaction occurring within the last 10 years: No  If all of the above answers are "NO", then may proceed with Cephalosporin use.  Has patient had a PCN reaction causing immediate rash, facial/tongue/throat swelling, SOB or lightheadedness with hypotension: Yes  Has patient had a PCN reaction causing severe rash involving mucus membranes or skin necrosis: No  Has patient had a PCN reaction that required hospitalization No  Has patient had a PCN reaction occurring within the last 10 years: No  If all of the above answers are "NO", then may proceed with Cephalosporin use.   Pineapple Swelling    Itchy throat  Itchy throat  Itchy throat  Sulfa Antibiotics Hives       Objective:   Physical Exam Vitals and nursing note reviewed.  Constitutional:      General: Pt is not in acute distress.    Appearance: Normal appearance.  HENT:     Head: Normocephalic.  Pulmonary:     Effort: No respiratory distress.  Musculoskeletal:     Cervical back: Normal range of motion.  Skin:    General: Skin is dry.     Coloration: Skin is not pale.  Neurological:     Mental Status: Pt is alert and oriented to person, place, and time.  Psychiatric:        Mood and Affect: Mood normal.   Temp 98 F (36.7 C) (Oral)   Ht 5' 7.72" (1.72 m)   Wt 190 lb (86.2 kg)   LMP 09/07/2023 (Exact Date)   BMI 29.13 kg/m      I discussed the assessment and treatment plan with the patient. The patient was provided an opportunity to ask questions and all were answered. The patient agreed with the plan and demonstrated an understanding of the instructions.   The patient was advised to call back or seek an in-person evaluation if the symptoms worsen or if the condition fails to improve as anticipated.  Dulce Sellar, NP [facility] [phone] (phone) [phone] (fax)  [facility]

## 2023-09-16 NOTE — Telephone Encounter (Signed)
 Please see pt msg and advise if VV is needed

## 2023-09-18 ENCOUNTER — Encounter: Payer: Self-pay | Admitting: Family

## 2023-10-01 ENCOUNTER — Other Ambulatory Visit: Payer: Self-pay | Admitting: Physician Assistant

## 2023-10-01 DIAGNOSIS — L309 Dermatitis, unspecified: Secondary | ICD-10-CM

## 2023-10-19 DIAGNOSIS — D1801 Hemangioma of skin and subcutaneous tissue: Secondary | ICD-10-CM | POA: Diagnosis not present

## 2023-10-19 DIAGNOSIS — L245 Irritant contact dermatitis due to other chemical products: Secondary | ICD-10-CM | POA: Diagnosis not present

## 2023-10-19 DIAGNOSIS — D224 Melanocytic nevi of scalp and neck: Secondary | ICD-10-CM | POA: Diagnosis not present

## 2023-10-19 DIAGNOSIS — D2361 Other benign neoplasm of skin of right upper limb, including shoulder: Secondary | ICD-10-CM | POA: Diagnosis not present

## 2023-10-19 DIAGNOSIS — D2272 Melanocytic nevi of left lower limb, including hip: Secondary | ICD-10-CM | POA: Diagnosis not present

## 2023-10-19 DIAGNOSIS — L738 Other specified follicular disorders: Secondary | ICD-10-CM | POA: Diagnosis not present

## 2023-10-19 DIAGNOSIS — L570 Actinic keratosis: Secondary | ICD-10-CM | POA: Diagnosis not present

## 2023-10-19 DIAGNOSIS — D2271 Melanocytic nevi of right lower limb, including hip: Secondary | ICD-10-CM | POA: Diagnosis not present

## 2023-10-19 DIAGNOSIS — L814 Other melanin hyperpigmentation: Secondary | ICD-10-CM | POA: Diagnosis not present

## 2023-10-20 ENCOUNTER — Encounter: Payer: Self-pay | Admitting: Physician Assistant

## 2023-10-20 NOTE — Telephone Encounter (Signed)
 See pt msg and advise if ok to complete form for patient

## 2023-10-28 ENCOUNTER — Encounter: Payer: Self-pay | Admitting: Physician Assistant

## 2023-10-29 NOTE — Telephone Encounter (Signed)
 Please advise on pt request.

## 2023-11-02 ENCOUNTER — Encounter: Payer: Self-pay | Admitting: Physician Assistant

## 2023-11-02 ENCOUNTER — Ambulatory Visit (INDEPENDENT_AMBULATORY_CARE_PROVIDER_SITE_OTHER): Admitting: Physician Assistant

## 2023-11-02 VITALS — BP 122/78 | HR 74 | Temp 97.7°F | Ht 67.5 in | Wt 198.4 lb

## 2023-11-02 DIAGNOSIS — E66811 Obesity, class 1: Secondary | ICD-10-CM | POA: Diagnosis not present

## 2023-11-02 DIAGNOSIS — R6889 Other general symptoms and signs: Secondary | ICD-10-CM | POA: Diagnosis not present

## 2023-11-02 MED ORDER — TIRZEPATIDE-WEIGHT MANAGEMENT 2.5 MG/0.5ML ~~LOC~~ SOLN: 2.5000 mg | SUBCUTANEOUS | 0 refills | Status: DC

## 2023-11-02 MED ORDER — ONDANSETRON HCL 4 MG PO TABS: 4.0000 mg | ORAL_TABLET | Freq: Three times a day (TID) | ORAL | 0 refills | Status: DC | PRN

## 2023-11-02 NOTE — Progress Notes (Signed)
 Patient ID: Kelly Navarro, female    DOB: [birth year], 38 y.o.   MRN: [id number]   Assessment & Plan:  Obesity (BMI 30.0-34.9) -     Tirzepatide-Weight Management; Inject 2.5 mg into the skin once a week.  Dispense: 2 mL; Refill: 0  Difficulty maintaining weight loss -     Tirzepatide-Weight Management; Inject 2.5 mg into the skin once a week.  Dispense: 2 mL; Refill: 0  Other orders -     Ondansetron  HCl; Take 1 tablet (4 mg total) by mouth every 8 (eight) hours as needed for nausea or vomiting.  Dispense: 20 tablet; Refill: 0      Assessment & Plan Obesity BMI 30-30.9. Weight loss plateau. Interested in Zepbound despite insurance issues. Discussed side effects and mitigation strategies. Comfortable with self-injection. Goal: feel better, possibly reach 160 lbs. Long-term outcomes of medication uncertain. - Send prescription for Zepbound to direct self-pay pharmacy for 2.5 mg weekly injection for 30 days. - Prescribe Zofran  to [facility] for nausea management. - Advise taking hair and nail vitamins to mitigate potential hair loss. - Encourage a balanced diet with adequate protein and fiber intake. - Schedule follow-up appointment in 6-8 weeks to assess progress and medication tolerance.   - Prescribe Zofran  to manage potential nausea and alleviate anxiety related to this side effect.  Vitamin D  deficiency Deficiency noted in January labs. She is aware and trying to remember supplements. - Encourage continued supplementation of vitamin D .  Vitamin B12 deficiency Low normal levels noted in January labs. She is aware and trying to remember supplements. - Encourage continued supplementation of vitamin B12.      Return in about 8 weeks (around 12/28/2023) for recheck/follow-up.    Subjective:    Chief Complaint  Patient presents with   Weight Loss    HPI Discussed the use of AI scribe software for clinical note transcription with the patient, who gave  verbal consent to proceed.  History of Present Illness Kelly Navarro is a 38 year old female who presents for consultation regarding weight management and potential use of Zepbound.  She feels she is not making progress despite regular exercise and a relatively healthy diet. She is interested in Zepbound for weight management and is exploring self-pay options due to lack of insurance coverage. She is concerned about potential side effects, particularly nausea, as it may exacerbate her anxiety. She is comfortable with self-injection, having done so during her second pregnancy. Cost is a concern, and she is exploring discounts and direct pharmacy purchases.  She aims to achieve a target weight of around 160 pounds, as she is currently classified as obese by BMI. She frequently experiences low energy, which she attributes to stress and possibly low vitamin levels. She is taking vitamin D  and B12 supplements due to deficiencies identified in January. She is considering hair and nail vitamins due to concerns about potential hair loss with Zepbound.  She has no sleep apnea but experiences occasional trouble sleeping. She is aware of her mother's history of non-Hodgkin's lymphoma and is concerned about potential thyroid  cancer risks.     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   Eczema    Gaucher disease, type I (HCC)  noted on [facility] genetic testing 05/2015   GERD (gastroesophageal reflux disease)    Headache(784.0)    menstrual    IBS (irritable bowel syndrome)    Incomplete miscarriage 02/22/2019   Scoliosis    very mild    Past Surgical History:  Procedure Laterality Date   CESAREAN SECTION N/A  10/21/2016   Procedure: CESAREAN SECTION;  Surgeon: Dyanna Glasgow, DO;  Location: [facility];  Service: Obstetrics;  Laterality: N/A;   CESAREAN SECTION N/A 03/22/2020   Procedure: CESAREAN SECTION;  Surgeon: Audelia Leaks, MD;  Location: [facility];  Service: Obstetrics;  Laterality: N/A;   DILATION AND EVACUATION N/A 02/22/2019   Procedure: DILATATION AND EVACUATION;  Surgeon: Thurman Flores, MD;  Location: [facility] OR;  Service: Gynecology;  Laterality: N/A;   GUM SURGERY     OTHER SURGICAL HISTORY  2020   septate uterus, polyp   WISDOM TOOTH EXTRACTION      Family History  Problem Relation Age of Onset   Hypertension Mother    Non-Hodgkin's lymphoma Mother        currently in remission   Healthy Father    Anxiety disorder Sister    Depression Sister    Asthma Sister    Eczema Sister    Diabetes Maternal Grandfather    Hypertension Maternal Grandfather    Heart disease Maternal Grandfather    Breast cancer Paternal Grandmother        Age 24   Cancer Paternal Grandfather        multiple myeloma   Mood Disorder Paternal Grandfather     Social History   Tobacco Use   Smoking status: Never   Smokeless tobacco: Never  Vaping Use   Vaping status: Never Used  Substance Use Topics   Alcohol use: Not Currently    Alcohol/week: 1.0 standard drink of alcohol    Types: 1 Standard drinks or equivalent per week    Comment: Occasionally   Drug use: No     Allergies  Allergen Reactions   Amoxicillin Hives    Has patient had a PCN reaction causing immediate rash, facial/tongue/throat swelling, SOB or lightheadedness with hypotension: Yes Has patient had a PCN reaction causing severe rash involving mucus membranes or skin necrosis: No Has patient had a PCN reaction that required hospitalization No Has patient had a PCN reaction occurring within the last 10 years: No If all of the above answers are "NO", then may proceed with Cephalosporin use.   Bee Venom Other (See Comments)     Tongue swelling   Macrodantin  [Nitrofurantoin  Macrocrystal] Nausea And Vomiting   Nitrofurantoin  Other (See Comments) and Nausea Only   Penicillins Hives    Has patient had a PCN reaction causing immediate rash, facial/tongue/throat swelling, SOB or lightheadedness with hypotension: Yes  Has patient had a PCN reaction causing severe rash involving mucus membranes or skin necrosis: No  Has patient had a PCN reaction that required hospitalization No  Has patient had a PCN reaction occurring within the last 10 years: No  If all of the above answers are "NO", then may proceed with Cephalosporin use.  Has patient had a PCN reaction causing immediate rash, facial/tongue/throat swelling, SOB or lightheadedness with hypotension: Yes  Has patient had a PCN reaction causing severe rash involving mucus membranes or skin necrosis: No  Has patient had a PCN reaction that required hospitalization No  Has patient had a PCN reaction occurring within the last 10 years: No  If all of the  above answers are "NO", then may proceed with Cephalosporin use.  Has patient had a PCN reaction causing immediate rash, facial/tongue/throat swelling, SOB or lightheadedness with hypotension: Yes  Has patient had a PCN reaction causing severe rash involving mucus membranes or skin necrosis: No  Has patient had a PCN reaction that required hospitalization No  Has patient had a PCN reaction occurring within the last 10 years: No  If all of the above answers are "NO", then may proceed with Cephalosporin use.   Pineapple Swelling    Itchy throat  Itchy throat  Itchy throat   Sulfa Antibiotics Hives    Review of Systems NEGATIVE UNLESS OTHERWISE INDICATED IN HPI      Objective:     BP 122/78   Pulse 74   Temp 97.7 F (36.5 C) (Temporal)   Ht 5' 7.5" (1.715 m)   Wt 198 lb 6.4 oz (90 kg)   SpO2 99%   BMI 30.62 kg/m   Wt Readings from Last 3 Encounters:  11/02/23 198 lb 6.4 oz (90 kg)  09/16/23 190 lb  (86.2 kg)  07/17/23 199 lb 3.2 oz (90.4 kg)    BP Readings from Last 3 Encounters:  11/02/23 122/78  07/17/23 108/74  04/08/23 108/72     Physical Exam Vitals and nursing note reviewed.  Constitutional:      Appearance: Normal appearance. She is obese.  Eyes:     Extraocular Movements: Extraocular movements intact.     Conjunctiva/sclera: Conjunctivae normal.     Pupils: Pupils are equal, round, and reactive to light.  Cardiovascular:     Rate and Rhythm: Normal rate and regular rhythm.  Pulmonary:     Effort: Pulmonary effort is normal.     Breath sounds: Normal breath sounds.  Neurological:     General: No focal deficit present.     Mental Status: She is alert and oriented to person, place, and time.  Psychiatric:        Mood and Affect: Mood normal.        Behavior: Behavior normal.             Navdeep Halt [facility] Kyler, PA-C

## 2023-11-13 ENCOUNTER — Other Ambulatory Visit: Payer: Self-pay | Admitting: Physician Assistant

## 2023-11-13 DIAGNOSIS — L309 Dermatitis, unspecified: Secondary | ICD-10-CM

## 2023-11-19 ENCOUNTER — Other Ambulatory Visit: Payer: Self-pay | Admitting: Physician Assistant

## 2023-11-19 DIAGNOSIS — E66811 Obesity, class 1: Secondary | ICD-10-CM

## 2023-11-19 DIAGNOSIS — R6889 Other general symptoms and signs: Secondary | ICD-10-CM

## 2023-11-30 ENCOUNTER — Encounter: Payer: Self-pay | Admitting: Physician Assistant

## 2023-11-30 NOTE — Telephone Encounter (Signed)
 Please see pt msg and advise if staying on current dose for another month

## 2023-12-01 ENCOUNTER — Other Ambulatory Visit: Payer: Self-pay

## 2023-12-01 MED ORDER — TIRZEPATIDE-WEIGHT MANAGEMENT 5 MG/0.5ML ~~LOC~~ SOLN: 5.0000 mg | SUBCUTANEOUS | 0 refills | Status: DC

## 2023-12-01 NOTE — Telephone Encounter (Signed)
 Please see pt msg and advise. Not showing any coupons online for patient's using self pay option

## 2023-12-02 NOTE — Telephone Encounter (Signed)
 Please see pt response and advise

## 2023-12-03 ENCOUNTER — Other Ambulatory Visit: Payer: Self-pay

## 2023-12-03 DIAGNOSIS — R6889 Other general symptoms and signs: Secondary | ICD-10-CM

## 2023-12-03 DIAGNOSIS — E66811 Obesity, class 1: Secondary | ICD-10-CM

## 2023-12-03 MED ORDER — TIRZEPATIDE-WEIGHT MANAGEMENT 2.5 MG/0.5ML ~~LOC~~ SOLN
2.5000 mg | SUBCUTANEOUS | 0 refills | Status: AC
Start: 2023-12-03 — End: 2024-01-02

## 2023-12-13 ENCOUNTER — Other Ambulatory Visit: Payer: Self-pay | Admitting: Physician Assistant

## 2023-12-13 DIAGNOSIS — L309 Dermatitis, unspecified: Secondary | ICD-10-CM

## 2023-12-17 ENCOUNTER — Ambulatory Visit: Payer: 59 | Admitting: Physician Assistant

## 2023-12-18 ENCOUNTER — Other Ambulatory Visit: Payer: Self-pay | Admitting: Physician Assistant

## 2023-12-24 ENCOUNTER — Telehealth: Payer: Self-pay

## 2023-12-24 DIAGNOSIS — F4323 Adjustment disorder with mixed anxiety and depressed mood: Secondary | ICD-10-CM

## 2023-12-24 MED ORDER — FLUOXETINE HCL 20 MG PO CAPS: ORAL_CAPSULE | ORAL | 0 refills | Status: DC

## 2023-12-24 NOTE — Telephone Encounter (Signed)
 Pt had appt with Kelly Navarro and canceled it. She has an appt with Kelly Navarro on 7/11. Sent in a 2-week supply of fluoxetine  to bridge her.

## 2024-01-04 ENCOUNTER — Ambulatory Visit (INDEPENDENT_AMBULATORY_CARE_PROVIDER_SITE_OTHER): Admitting: Physician Assistant

## 2024-01-04 VITALS — BP 98/74 | HR 78 | Temp 98.0°F | Ht 67.5 in | Wt 183.6 lb

## 2024-01-04 DIAGNOSIS — M542 Cervicalgia: Secondary | ICD-10-CM | POA: Diagnosis not present

## 2024-01-04 DIAGNOSIS — E663 Overweight: Secondary | ICD-10-CM | POA: Diagnosis not present

## 2024-01-04 MED ORDER — ZEPBOUND 5 MG/0.5ML ~~LOC~~ SOLN: 5.0000 mg | SUBCUTANEOUS | 0 refills | Status: DC

## 2024-01-04 NOTE — Progress Notes (Signed)
 Patient ID: Kelly Navarro, female    DOB: 03-24-1986, 38 y.o.   MRN: [id number]   Assessment & Plan:  Overweight with body mass index (BMI) 25.0-29.9 -     Zepbound ; Inject 5 mg into the skin every 7 (seven) days. INJECT 0.5 ML (5 MG) UNDER THE SKIN ONCE WEEKLY (0.5ML= 50 UNITS)  Dispense: 2 mL; Refill: 0  Posterior neck pain -     Ambulatory referral to Physical Therapy      Assessment and Plan Assessment & Plan Obesity She is on Zepbound  (tirzepatide ) 5 mg for weight management, resulting in significant weight loss from 198 lbs in May to 183 lbs currently, slightly exceeding expectations. She reports decreased appetite, mild diarrhea, fatigue, and headache post-dose. She is self-paying and considering a dose increase to 7.5 mg if weight loss plateaus. Insurance coverage is unlikely without a diagnosis of obstructive sleep apnea. Discussion included insurance options and the risks of non-FDA regulated GLP-1 medications from compounding pharmacies. - Continue Zepbound  5 mg weekly for another month - Consider increasing to 7.5 mg if weight loss plateaus - Monitor for side effects and communicate any concerns - Ensure adequate nutrition to avoid malnutrition - Discuss potential insurance coverage options for Zepbound   Neck pain with associated headaches She experiences neck pain with headaches and jaw pain due to a previous neck injury. She did not follow up on a prior physical therapy referral but is now interested in resuming therapy. - Renew referral to physical therapy for neck pain management - Schedule appointment with physical therapist  General Health Maintenance She is actively managing her weight and understands the importance of nutrition and exercise. She is considering a job change for better benefits, which may affect healthcare coverage. - Encourage continued focus on nutrition and exercise - Discuss potential job change and its impact on healthcare  benefits  Follow-up A follow-up appointment is scheduled for three months. She will communicate via messaging for medication adjustments as needed. - Schedule follow-up appointment in three months - Maintain communication through messaging for medication adjustments      Return in about 3 months (around 04/05/2024) for recheck/follow-up.    Subjective:    Chief Complaint  Patient presents with   Obesity    Pt in today for obesity and medication follow up; pt stated things have been going well but would like to discuss some side affects;     HPI Discussed the use of AI scribe software for clinical note transcription with the patient, who gave verbal consent to proceed.  History of Present Illness The patient presents for a medication check and follow-up regarding her use of Zepbound  for weight management.  She has been using Zepbound , currently on a 5 mg dose, and has lost approximately 15 pounds since May, reducing her weight from 198 pounds to 183 pounds. She is considering increasing her dose to 7.5 mg. She is concerned about the cost and insurance coverage. She is obtaining Zepbound  through [facility].  She experiences decreased appetite, particularly in the first 24 hours after taking the injection, which makes it difficult to eat. She has been trying to find snacks she can tolerate during this period. She also reports occasional diarrhea and fatigue, and a severe headache after her last dose, which was a new occurrence. No severe stomach pains, nausea, or constipation.  She mentions a previous referral to a physical therapist for a neck injury, which she did not follow up on. She is experiencing  headaches and jaw pain related to this issue and is considering scheduling an appointment with the physical therapist.     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   Eczema    Gaucher disease, type I (HCC)    noted on [facility] genetic testing 05/2015   GERD (gastroesophageal reflux disease)    Headache(784.0)    menstrual    IBS (irritable bowel syndrome)    Incomplete miscarriage 02/22/2019   Scoliosis    very mild    Past Surgical History:  Procedure Laterality Date   CESAREAN SECTION N/A 10/21/2016   Procedure: CESAREAN SECTION;  Surgeon: Duwaine Blumenthal, DO;  [facility]: [facility];  Service: Obstetrics;  Laterality: N/A;   CESAREAN SECTION N/A 03/22/2020   Procedure: CESAREAN SECTION;  Surgeon: Kandyce Sor, MD;  Location: [facility];  Service: Obstetrics;  Laterality: N/A;   DILATION AND EVACUATION N/A 02/22/2019   Procedure: DILATATION AND EVACUATION;  Surgeon: Mat Browning, MD;  Location: [facility];  Service: Gynecology;  Laterality: N/A;   GUM SURGERY     OTHER SURGICAL HISTORY  2020   septate uterus, polyp   WISDOM TOOTH EXTRACTION      Family History  Problem Relation Age of Onset   Hypertension Mother    Non-Hodgkin's lymphoma Mother        currently in remission   Healthy Father    Anxiety disorder Sister    Depression Sister    Asthma Sister    Eczema Sister    Diabetes Maternal Grandfather    Hypertension Maternal Grandfather    Heart disease Maternal Grandfather    Breast cancer Paternal Grandmother        Age 39   Cancer Paternal Grandfather        multiple myeloma   Mood Disorder Paternal Grandfather     Social History   Tobacco Use   Smoking status: Never   Smokeless tobacco: Never  Vaping Use   Vaping status: Never Used  Substance Use Topics   Alcohol use: Not Currently    Alcohol/week: 1.0 standard drink of alcohol    Types: 1 Standard drinks or equivalent per week    Comment: Occasionally   Drug use: No      Allergies  Allergen Reactions   Amoxicillin Hives    Has patient had a PCN reaction causing immediate rash, facial/tongue/throat swelling, SOB or lightheadedness with hypotension: Yes Has patient had a PCN reaction causing severe rash involving mucus membranes or skin necrosis: No Has patient had a PCN reaction that required hospitalization No Has patient had a PCN reaction occurring within the last 10 years: No If all of the above answers are NO, then may proceed with Cephalosporin use.   Bee Venom Other (See Comments)    Tongue swelling   Macrodantin  [Nitrofurantoin  Macrocrystal] Nausea And Vomiting   Nitrofurantoin  Other (See Comments) and Nausea Only   Penicillins Hives    Has patient had a PCN reaction causing immediate rash, facial/tongue/throat swelling, SOB or lightheadedness with hypotension: Yes  Has patient had a PCN reaction causing severe rash involving mucus membranes or skin necrosis: No  Has patient had a PCN reaction that required hospitalization No  Has patient had a PCN reaction  occurring within the last 10 years: No  If all of the above answers are NO, then may proceed with Cephalosporin use.  Has patient had a PCN reaction causing immediate rash, facial/tongue/throat swelling, SOB or lightheadedness with hypotension: Yes  Has patient had a PCN reaction causing severe rash involving mucus membranes or skin necrosis: No  Has patient had a PCN reaction that required hospitalization No  Has patient had a PCN reaction occurring within the last 10 years: No  If all of the above answers are NO, then may proceed with Cephalosporin use.  Has patient had a PCN reaction causing immediate rash, facial/tongue/throat swelling, SOB or lightheadedness with hypotension: Yes  Has patient had a PCN reaction causing severe rash involving mucus membranes or skin necrosis: No  Has patient had a PCN reaction that required hospitalization No  Has patient had a PCN reaction  occurring within the last 10 years: No  If all of the above answers are NO, then may proceed with Cephalosporin use.   Pineapple Swelling    Itchy throat  Itchy throat  Itchy throat   Sulfa Antibiotics Hives    Review of Systems NEGATIVE UNLESS OTHERWISE INDICATED IN HPI      Objective:     BP 98/74 (BP Location: Right Arm, Patient Position: Sitting, Cuff Size: Normal)   Pulse 78   Temp 98 F (36.7 C) (Temporal)   Ht 5' 7.5 (1.715 m)   Wt 183 lb 9.6 oz (83.3 kg)   LMP 12/14/2023 (Approximate)   SpO2 96%   BMI 28.33 kg/m   Wt Readings from Last 3 Encounters:  01/04/24 183 lb 9.6 oz (83.3 kg)  11/02/23 198 lb 6.4 oz (90 kg)  09/16/23 190 lb (86.2 kg)    BP Readings from Last 3 Encounters:  01/04/24 98/74  11/02/23 122/78  07/17/23 108/74     Physical Exam Vitals and nursing note reviewed.  Constitutional:      Appearance: Normal appearance. She is obese.  Eyes:     Extraocular Movements: Extraocular movements intact.     Conjunctiva/sclera: Conjunctivae normal.     Pupils: Pupils are equal, round, and reactive to light.  Cardiovascular:     Rate and Rhythm: Normal rate and regular rhythm.  Pulmonary:     Effort: Pulmonary effort is normal.  Neurological:     Mental Status: She is alert.  Psychiatric:        Mood and Affect: Mood normal.             Andrw Mcguirt M Keatyn Luck, PA-C

## 2024-01-08 DIAGNOSIS — F410 Panic disorder [episodic paroxysmal anxiety] without agoraphobia: Secondary | ICD-10-CM | POA: Diagnosis not present

## 2024-01-08 DIAGNOSIS — F331 Major depressive disorder, recurrent, moderate: Secondary | ICD-10-CM | POA: Diagnosis not present

## 2024-01-08 DIAGNOSIS — F4312 Post-traumatic stress disorder, chronic: Secondary | ICD-10-CM | POA: Diagnosis not present

## 2024-01-18 ENCOUNTER — Other Ambulatory Visit: Payer: Self-pay | Admitting: Physician Assistant

## 2024-01-18 DIAGNOSIS — L309 Dermatitis, unspecified: Secondary | ICD-10-CM

## 2024-01-21 ENCOUNTER — Other Ambulatory Visit: Payer: Self-pay | Admitting: Physician Assistant

## 2024-01-21 DIAGNOSIS — E663 Overweight: Secondary | ICD-10-CM

## 2024-02-01 ENCOUNTER — Encounter: Payer: Self-pay | Admitting: Physical Therapy

## 2024-02-01 ENCOUNTER — Ambulatory Visit: Admitting: Physical Therapy

## 2024-02-01 ENCOUNTER — Other Ambulatory Visit: Payer: Self-pay

## 2024-02-01 DIAGNOSIS — M542 Cervicalgia: Secondary | ICD-10-CM | POA: Diagnosis not present

## 2024-02-01 DIAGNOSIS — R293 Abnormal posture: Secondary | ICD-10-CM

## 2024-02-01 DIAGNOSIS — M6281 Muscle weakness (generalized): Secondary | ICD-10-CM

## 2024-02-01 NOTE — Therapy (Signed)
 OUTPATIENT PHYSICAL THERAPY CERVICAL EVALUATION   Patient Name: Kelly Navarro MRN: [id number] DOB:1985-07-13, 38 y.o., female Today's Date: 02/01/2024  END OF SESSION:  PT End of Session - 02/01/24 0846     Visit Number 1    Number of Visits 8    Date for PT Re-Evaluation 03/28/24    Authorization Type [employer]    PT Start Time [phone]    PT Stop Time 0935    PT Time Calculation (min) 49 min    Activity Tolerance Patient tolerated treatment well    Behavior During Therapy [facility] for tasks assessed/performed          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   Eczema    Gaucher disease, type I (HCC)    noted on [facility] genetic testing 05/2015   GERD (gastroesophageal reflux disease)    Headache(784.0)    menstrual    IBS (irritable bowel syndrome)    Incomplete miscarriage 02/22/2019   Scoliosis    very mild   Past Surgical History:  Procedure Laterality Date   CESAREAN SECTION N/A 10/21/2016   Procedure: CESAREAN SECTION;  Surgeon: Duwaine Blumenthal, DO;  Location: [facility];  Service: Obstetrics;  Laterality: N/A;   CESAREAN SECTION N/A 03/22/2020   Procedure: CESAREAN SECTION;  Surgeon: Kandyce Sor, MD;  Location: [facility];  Service: Obstetrics;  Laterality: N/A;   DILATION AND EVACUATION N/A 02/22/2019   Procedure: DILATATION AND EVACUATION;  Surgeon: Mat Browning, MD;  Location: [facility] OR;  Service: Gynecology;  Laterality: N/A;   GUM SURGERY     OTHER SURGICAL HISTORY  2020   septate uterus, polyp   WISDOM TOOTH EXTRACTION     Patient Active Problem List   Diagnosis Date Noted   History of spontaneous abortion 07/17/2023   Polyp of endometrium 07/17/2023   Subchorionic hematoma  04/08/2023   Uterus arcuatus 04/08/2023   Human papilloma virus infection 04/08/2023   Thrombophilia (HCC) 04/08/2023   Menorrhagia 01/27/2023   Vaginal irritation 01/27/2023   Obesity (BMI 30.0-34.9) 05/26/2022   Influenza A (H1N1) 06/05/2021   Mood disorder (HCC) 11/02/2020   Anxiety 10/08/2020   Thrombophilia affecting pregnancy, antepartum (PAI) 03/25/2020   Postpartum care following cesarean delivery (9/23) 03/23/2020   Placental abruption in third trimester 03/23/2020   History of cesarean section 10/21/2016   Gaucher disease (HCC) 07/10/2015    PCP: Allwardt, Mardy HERO, PA-C  REFERRING PROVIDER: Allwardt, Mardy HERO, PA-C  REFERRING DIAG: M54.2 (ICD-10-CM) - Posterior neck pain  THERAPY DIAG:  Cervicalgia  Muscle weakness (generalized)  Abnormal posture  Rationale for Evaluation and Treatment: Rehabilitation  ONSET DATE: 10 years, worsening in the past 6 months  SUBJECTIVE:  SUBJECTIVE STATEMENT: Pt reports headaches that feels that come from her shoulders and neck. Has been worsening recently. Feels very tight and radiates into her jaw. Sometimes her jaw is so sensitive that even talking it feels like things will tense up. Did dry needling in the past which had helped ~3 years ago. Feels it may be worse on the left side of her neck. Has a theracane at home that she uses. Does have a cranio cradle for suboccipitals. Pt does work out in group fitness 5x/wk -- 2 days upper body.  Gets Zepbound  injections which can trigger headaches Hand dominance: Right  PERTINENT HISTORY:  Chronic neck pain, pelvic floor therapy in the past, history of low back pain  PAIN:  Are you having pain? Yes: NPRS scale: 4 or 5 currently Pain location: side and posterior neck Pain description:  Headaches, tightness, achiness Aggravating factors: AM, end of menstrual cycle Relieving factors: Dry needling  Headache 3/10 Can be different places   PRECAUTIONS: None  RED FLAGS: None   WEIGHT BEARING RESTRICTIONS: No  FALLS:  Has patient fallen in last 6 months? 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  DIAGNOSTIC FINDINGS:  None seen on Epic  PATIENT SURVEYS:  Neck Disability Index: 8 / 50 = 16.0 %  COGNITION: Overall cognitive status: Within functional limits for tasks assessed  SENSATION: WFL  POSTURE: rounded shoulders, forward head, and increased thoracic kyphosis  PALPATION: Most TTP bilat UTs, suboccipitals, semispinalis, masseters  CERVICAL ROM:   Active ROM A/PROM (deg) eval  Flexion 45 stretches bilat  Extension 35 some pain along R neck and into jaw  Right lateral flexion 35 stretch  Left lateral flexion 30 stretch  Right rotation 50  Left rotation 55   (Blank rows = not tested)  UPPER EXTREMITY ROM: WNL throughout  Active ROM Right eval Left eval  Shoulder flexion    Shoulder extension    Shoulder abduction    Shoulder adduction    Shoulder extension    Shoulder internal rotation    Shoulder external rotation    Elbow flexion    Elbow extension    Wrist flexion    Wrist extension    Wrist ulnar deviation    Wrist radial deviation    Wrist pronation    Wrist supination     (Blank rows = not tested)  UPPER EXTREMITY MMT:  MMT Right eval Left eval  Shoulder flexion 5 5  Shoulder extension    Shoulder abduction 5 5  Shoulder adduction    Shoulder  extension in prone 3+ pain 3+ pain  Shoulder internal rotation 5 5  Shoulder external rotation 4- with some pain on R 4-  Middle trapezius 4- 3+  Lower trapezius    Elbow flexion    Elbow extension    Wrist flexion    Wrist extension    Wrist ulnar deviation    Wrist radial deviation    Wrist pronation    Wrist supination    Grip strength     (Blank rows = not tested)  CERVICAL SPECIAL TESTS:  Did not assess  FUNCTIONAL TESTS:  Did not assess  TREATMENT DATE: 02/01/24 See HEP below Self care: Posture and ergonomics of head/shoulder positioning with her work outs; trigger point releases for cervical muscles  PATIENT EDUCATION:  Education details: Exam findings, POC, initial HEP, trigger points, dry needling, tracking headache triggers  Person educated: Patient Education method: Explanation Education comprehension: verbalized understanding, returned demonstration, and needs further education  HOME EXERCISE PROGRAM: Access Code: [email or web address] URL: [email or web address] Date: 02/01/2024 Prepared by: Kealie Barrie April Earnie Starring  Exercises - Standing Cervical Retraction  - 3-5 x daily - 7 x weekly - 2 sets - 10 reps - 3 sec hold - Shoulder External Rotation and Scapular Retraction  - 3-5 x daily - 7 x weekly - 2 sets - 10 reps - Seated Gentle Upper Trapezius Stretch  - 1 x daily - 7 x weekly - 2 sets - 30 sec hold - Sub-Occipital Cervical Stretch  - 1 x daily - 7 x weekly - 2 sets - 30 sec hold  ASSESSMENT:  CLINICAL IMPRESSION: Patient is a 38 y.o. F who was seen today for physical therapy evaluation and treatment for neck pain, jaw pain, and headaches. Assessment is significant for forward head posture, weak posterior shoulder girdle and midback musculature, with trigger points throughout her cervical spine but most notable in her UTs and suboccipitals. Had good results with dry needling in the past. Pt will benefit from PT to address these deficits to improve her  comfort with work, home, and community activities and maximize overall function.   OBJECTIVE IMPAIRMENTS: decreased activity tolerance, decreased coordination, decreased endurance, decreased mobility, decreased ROM, decreased strength, increased fascial restrictions, increased muscle spasms, improper body mechanics, postural dysfunction, and pain.   ACTIVITY LIMITATIONS: carrying, lifting, toileting, dressing, reach over head, hygiene/grooming, and caring for others  PARTICIPATION LIMITATIONS: driving, community activity, and occupation  PERSONAL FACTORS: Age, Fitness, Past/current experiences, and Time since onset of injury/illness/exacerbation are also affecting patient's functional outcome.   REHAB POTENTIAL: Good  CLINICAL DECISION MAKING: Evolving/moderate complexity  EVALUATION COMPLEXITY: Moderate   GOALS: Goals reviewed with patient? Yes  SHORT TERM GOALS: Target date: 02/29/2024   Pt will be ind with initial HEP Baseline:  Goal status: INITIAL  2.  Pt will report reduction in pain and headaches by >/=25% Baseline:  Goal status: INITIAL   LONG TERM GOALS: Target date: 03/28/2024   Pt will be ind with management and progression of HEP Baseline:  Goal status: INITIAL  2.  Pt will have full and pain free cervical ROM Baseline:  Goal status: INITIAL  3.  Pt will demo at least 4/5 posterior shoulder strength for decreased UT/neck muscle compensation with activities Baseline:  Goal status: INITIAL  4.  Pt will report improved pain and headaches by >/=50% Baseline:  Goal status: INITIAL  5.  Pt will have improved NDI to </=6% to demo MCID Baseline: 16% Goal status: INITIAL    PLAN:  PT FREQUENCY: 1x/week  PT DURATION: 8 weeks  PLANNED INTERVENTIONS: 97164- PT Re-evaluation, 97110-Therapeutic exercises, 97530- Therapeutic activity, 97112- Neuromuscular re-education, 97535- Self Care, [id number]- Manual therapy, [id number]- Electrical stimulation (unattended), [phone]-  Ionotophoresis 4mg /ml Dexamethasone , 79439 (1-2 muscles), 20561 (3+ muscles)- Dry Needling, Patient/Family education, Taping, Joint mobilization, Spinal mobilization, Cryotherapy, and Moist heat  PLAN FOR NEXT SESSION: Assess response to HEP. Progress posterior shoulder girdle strengthening. Dry needling/manual therapy as indicated. Consider Rocabado exercises for jaw pain   Randeep Biondolillo April Ma L Lavaya Defreitas, PT, DPT 02/01/2024, 12:10 PM

## 2024-02-02 DIAGNOSIS — Z01419 Encounter for gynecological examination (general) (routine) without abnormal findings: Secondary | ICD-10-CM | POA: Diagnosis not present

## 2024-02-02 DIAGNOSIS — Z1331 Encounter for screening for depression: Secondary | ICD-10-CM | POA: Diagnosis not present

## 2024-02-04 ENCOUNTER — Encounter: Payer: Self-pay | Admitting: Physician Assistant

## 2024-02-04 ENCOUNTER — Other Ambulatory Visit: Payer: Self-pay | Admitting: *Deleted

## 2024-02-04 MED ORDER — TIRZEPATIDE-WEIGHT MANAGEMENT 7.5 MG/0.5ML ~~LOC~~ SOLN: 7.5000 mg | SUBCUTANEOUS | 0 refills | Status: DC

## 2024-02-16 DIAGNOSIS — F331 Major depressive disorder, recurrent, moderate: Secondary | ICD-10-CM | POA: Diagnosis not present

## 2024-02-16 DIAGNOSIS — F4312 Post-traumatic stress disorder, chronic: Secondary | ICD-10-CM | POA: Diagnosis not present

## 2024-02-16 DIAGNOSIS — F410 Panic disorder [episodic paroxysmal anxiety] without agoraphobia: Secondary | ICD-10-CM | POA: Diagnosis not present

## 2024-02-23 ENCOUNTER — Ambulatory Visit: Admitting: Physical Therapy

## 2024-02-23 ENCOUNTER — Other Ambulatory Visit: Payer: Self-pay

## 2024-02-23 ENCOUNTER — Other Ambulatory Visit: Payer: Self-pay | Admitting: Physician Assistant

## 2024-02-23 ENCOUNTER — Encounter: Payer: Self-pay | Admitting: Physical Therapy

## 2024-02-23 DIAGNOSIS — R293 Abnormal posture: Secondary | ICD-10-CM | POA: Diagnosis not present

## 2024-02-23 DIAGNOSIS — M542 Cervicalgia: Secondary | ICD-10-CM | POA: Diagnosis not present

## 2024-02-23 DIAGNOSIS — M6281 Muscle weakness (generalized): Secondary | ICD-10-CM

## 2024-02-23 NOTE — Patient Instructions (Signed)

## 2024-02-23 NOTE — Therapy (Signed)
 OUTPATIENT PHYSICAL THERAPY TREATMENT   Patient Name: Kelly Navarro MRN: [id number] DOB:July 06, 1985, 38 y.o., female Today's Date: 02/23/2024  END OF SESSION:  PT End of Session - 02/23/24 1303     Visit Number 2    Number of Visits 8    Date for PT Re-Evaluation 03/28/24    Authorization Type [employer]    PT Start Time 1303    PT Stop Time 1343    PT Time Calculation (min) 40 min    Activity Tolerance Patient tolerated treatment well    Behavior During Therapy [facility] for tasks assessed/performed           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   Eczema    Gaucher disease, type I (HCC)    noted on [facility] genetic testing 05/2015   GERD (gastroesophageal reflux disease)    Headache(784.0)    menstrual    IBS (irritable bowel syndrome)    Incomplete miscarriage 02/22/2019   Scoliosis    very mild   Past Surgical History:  Procedure Laterality Date   CESAREAN SECTION N/A 10/21/2016   Procedure: CESAREAN SECTION;  Surgeon: Duwaine Blumenthal, DO;  Location: [facility];  Service: Obstetrics;  Laterality: N/A;   CESAREAN SECTION N/A 03/22/2020   Procedure: CESAREAN SECTION;  Surgeon: Kandyce Sor, MD;  Location: [facility];  Service: Obstetrics;  Laterality: N/A;   DILATION AND EVACUATION N/A 02/22/2019   Procedure: DILATATION AND EVACUATION;  Surgeon: Mat Browning, MD;  Location: [facility] OR;  Service: Gynecology;  Laterality: N/A;   GUM SURGERY     OTHER SURGICAL HISTORY  2020   septate uterus, polyp   WISDOM TOOTH EXTRACTION     Patient Active Problem List   Diagnosis Date Noted   History of spontaneous abortion 07/17/2023   Polyp of endometrium 07/17/2023   Subchorionic hematoma  04/08/2023   Uterus arcuatus 04/08/2023   Human papilloma virus infection 04/08/2023   Thrombophilia (HCC) 04/08/2023   Menorrhagia 01/27/2023   Vaginal irritation 01/27/2023   Obesity (BMI 30.0-34.9) 05/26/2022   Influenza A (H1N1) 06/05/2021   Mood disorder (HCC) 11/02/2020   Anxiety 10/08/2020   Thrombophilia affecting pregnancy, antepartum (PAI) 03/25/2020   Postpartum care following cesarean delivery (9/23) 03/23/2020   Placental abruption in third trimester 03/23/2020   History of cesarean section 10/21/2016   Gaucher disease (HCC) 07/10/2015    PCP: Allwardt, Mardy HERO, PA-C  REFERRING PROVIDER: Allwardt, Mardy HERO, PA-C  REFERRING DIAG: M54.2 (ICD-10-CM) - Posterior neck pain  THERAPY DIAG:  Cervicalgia  Muscle weakness (generalized)  Abnormal posture  Rationale for Evaluation and Treatment: Rehabilitation  ONSET DATE: 10 years, worsening in the past 6 months  SUBJECTIVE:  SUBJECTIVE STATEMENT: Neck and jaw have been the most painful lately.    EVAL: Pt reports headaches that feels that come from her shoulders and neck. Has been worsening recently. Feels very tight and radiates into her jaw. Sometimes her jaw is so sensitive that even talking it feels like things will tense up. Did dry needling in the past which had helped ~3 years ago. Feels it may be worse on the left side of her neck. Has a theracane at home that she uses. Does have a cranio cradle for suboccipitals. Pt does work out in group fitness 5x/wk -- 2 days upper body.  Gets Zepbound  injections which can trigger headaches Hand dominance: Right  PERTINENT HISTORY:  Chronic neck pain, pelvic floor therapy in the past, history of low back pain  PAIN:  Are you having pain? Yes: NPRS scale: 7-8 tension,  currently Pain location: side and posterior neck Pain description: Headaches, tightness, achiness Aggravating factors: AM, end of menstrual cycle Relieving factors: Dry needling  Headache 3/10 Can be different places   PRECAUTIONS: None  RED FLAGS: None   WEIGHT BEARING RESTRICTIONS: No  FALLS:  Has patient fallen in last 6 months? 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  DIAGNOSTIC FINDINGS:  None seen on Epic  PATIENT SURVEYS:  Neck Disability Index: 8 / 50 = 16.0 %  COGNITION: Overall cognitive status: Within functional limits for tasks assessed  SENSATION: WFL  POSTURE: rounded shoulders, forward head, and increased thoracic kyphosis  PALPATION: Most TTP bilat UTs, suboccipitals, semispinalis, masseters  CERVICAL ROM:   Active ROM A/PROM (deg) eval  Flexion 45 stretches bilat  Extension 35 some pain along R neck and into jaw  Right lateral flexion 35 stretch  Left lateral flexion 30 stretch  Right rotation 50  Left rotation 55   (Blank rows = not tested)  UPPER EXTREMITY ROM: WNL throughout  Active ROM Right eval Left eval  Shoulder flexion    Shoulder extension    Shoulder abduction    Shoulder adduction    Shoulder extension    Shoulder internal rotation    Shoulder external rotation    Elbow flexion    Elbow extension    Wrist flexion    Wrist extension    Wrist ulnar deviation    Wrist radial deviation    Wrist pronation    Wrist supination     (Blank rows = not tested)  UPPER EXTREMITY MMT:  MMT Right eval Left eval  Shoulder flexion 5 5  Shoulder extension     Shoulder abduction 5 5  Shoulder adduction    Shoulder extension in prone 3+ pain 3+ pain  Shoulder internal rotation 5 5  Shoulder external rotation 4- with some pain on R 4-  Middle trapezius 4- 3+   (Blank rows = not tested)  CERVICAL SPECIAL TESTS:  Did not assess  FUNCTIONAL TESTS:  Did not assess  TREATMENT DATE:  02/23/24 Manual STM with compression to bil masseters, suboccipitals and cervical paraspinals; skilled palpation and monitoring of soft tissue during DN  TherEx Rocabado 6 - demonstrated with trial reps PRN for comprehension  Trigger Point Dry  Needling  Initial Treatment: Pt instructed on Dry Needling rational, procedures, and possible side effects. Pt instructed to expect mild to moderate muscle soreness later in the day and/or into the next day.  Pt instructed in methods to reduce muscle soreness. Pt instructed to continue prescribed HEP. Patient was educated on signs and symptoms of infection and other risk factors and advised to seek medical attention should they occur.  Patient verbalized understanding of these instructions and education.   Patient Verbal Consent Given: Yes Education Handout Provided: Yes Muscles Treated: bil masseter, bil lateral pterygoid, bil suboccipitals Electrical Stimulation Performed: No Treatment Response/Outcome: twitch responses, increased soreness noted    02/01/24 See HEP below Self care: Posture and ergonomics of head/shoulder positioning with her work outs; trigger point releases for cervical muscles  PATIENT EDUCATION:  Education details: Exam findings, POC, initial HEP, trigger points, dry needling, tracking headache triggers  Person educated: Patient Education method: Explanation Education comprehension: verbalized understanding, returned demonstration, and needs further education  HOME EXERCISE PROGRAM: Access Code: [email or web address] URL: [email or web address] Date: 02/01/2024 Prepared by: Gellen April  Earnie Starring  Exercises - Standing Cervical Retraction  - 3-5 x daily - 7 x weekly - 2 sets - 10 reps - 3 sec hold - Shoulder External Rotation and Scapular Retraction  - 3-5 x daily - 7 x weekly - 2 sets - 10 reps - Seated Gentle Upper Trapezius Stretch  - 1 x daily - 7 x weekly - 2 sets - 30 sec hold - Sub-Occipital Cervical Stretch  - 1 x daily - 7 x weekly - 2 sets - 30 sec hold  ASSESSMENT:  CLINICAL IMPRESSION: Pt tolerated session well today, trial of DN to chewing muscles and suboccipitals to see if this helps her jaw pain.  Continue skilled PT.   OBJECTIVE IMPAIRMENTS: decreased activity tolerance, decreased coordination, decreased endurance, decreased mobility, decreased ROM, decreased strength, increased fascial restrictions, increased muscle spasms, improper body mechanics, postural dysfunction, and pain.   ACTIVITY LIMITATIONS: carrying, lifting, toileting, dressing, reach over head, hygiene/grooming, and caring for others  PARTICIPATION LIMITATIONS: driving, community activity, and occupation  PERSONAL FACTORS: Age, Fitness, Past/current experiences, and Time since onset of injury/illness/exacerbation are also affecting patient's functional outcome.   REHAB POTENTIAL: Good  CLINICAL DECISION MAKING: Evolving/moderate complexity  EVALUATION COMPLEXITY: Moderate   GOALS: Goals reviewed with patient? Yes  SHORT TERM GOALS: Target date: 02/29/2024   Pt will be ind with initial HEP Baseline:  Goal status: INITIAL  2.  Pt will report reduction in pain and headaches by >/=25% Baseline:  Goal status: INITIAL   LONG TERM GOALS: Target date: 03/28/2024   Pt will be ind with management and progression of HEP Baseline:  Goal status: INITIAL  2.  Pt will have full and pain free cervical ROM Baseline:  Goal status: INITIAL  3.  Pt will demo at least 4/5 posterior shoulder strength for decreased UT/neck muscle compensation with activities Baseline:  Goal status:  INITIAL  4.  Pt will report improved pain and headaches by >/=50% Baseline:  Goal status: INITIAL  5.  Pt will have improved NDI to </=6% to demo MCID Baseline: 16% Goal status: INITIAL    PLAN:  PT FREQUENCY: 1x/week  PT DURATION: 8 weeks  PLANNED INTERVENTIONS: 97164- PT Re-evaluation, 97110-Therapeutic exercises, 97530- Therapeutic activity, 97112- Neuromuscular re-education, 97535- Self Care, [id number]- Manual therapy, [id number]- Electrical stimulation (unattended), [phone]- Ionotophoresis 4mg /ml Dexamethasone , 79439 (1-2 muscles), 20561 (3+ muscles)- Dry Needling, Patient/Family education, Taping, Joint mobilization, Spinal mobilization,  Cryotherapy, and Moist heat  PLAN FOR NEXT SESSION: check STGs, repeat DN PRN,  Assess response to HEP. Progress posterior shoulder girdle strengthening. Dry needling/manual therapy as indicated. Consider Rocabado exercises for jaw pain   Corean JULIANNA Ku, PT, DPT 02/23/24 1:51 PM

## 2024-03-01 ENCOUNTER — Encounter: Payer: Self-pay | Admitting: Physician Assistant

## 2024-03-01 NOTE — Telephone Encounter (Signed)
See pt msg and advise 

## 2024-03-03 NOTE — Telephone Encounter (Signed)
Please see pt reply and advise

## 2024-03-07 ENCOUNTER — Other Ambulatory Visit: Payer: Self-pay | Admitting: Physician Assistant

## 2024-03-07 NOTE — Telephone Encounter (Signed)
 Last Visit: 01/04/24  Next Visit: 04/05/24  Last Filled: 11/02/23  Quantity: 20

## 2024-03-08 ENCOUNTER — Ambulatory Visit: Payer: Self-pay

## 2024-03-08 ENCOUNTER — Encounter: Payer: Self-pay | Admitting: Physician Assistant

## 2024-03-08 DIAGNOSIS — J029 Acute pharyngitis, unspecified: Secondary | ICD-10-CM | POA: Diagnosis not present

## 2024-03-08 DIAGNOSIS — Z20818 Contact with and (suspected) exposure to other bacterial communicable diseases: Secondary | ICD-10-CM | POA: Diagnosis not present

## 2024-03-08 DIAGNOSIS — B9689 Other specified bacterial agents as the cause of diseases classified elsewhere: Secondary | ICD-10-CM | POA: Diagnosis not present

## 2024-03-08 NOTE — Telephone Encounter (Signed)
 Please see chart, duplicate message; MyChart message already sent to provider

## 2024-03-08 NOTE — Telephone Encounter (Signed)
 Please see pt msg, pt requesting antibiotic and declines visit

## 2024-03-08 NOTE — Telephone Encounter (Signed)
 FYI Only or Action Required?: Action required by provider: requesting antibiotic for herself as her daughter tested positive for strep today.  Patient was last seen in primary care on 01/04/2024 by Allwardt, Mardy HERO, PA-C.  Called Nurse Triage reporting Sore Throat.  Symptoms began today.  Interventions attempted: Rest, hydration, or home remedies.  Symptoms are: unchanged.  Triage Disposition: Call PCP Within 24 Hours-asking for a call back  Patient/caregiver understands and will follow disposition?: No, wishes to speak with PCP  Copied from CRM #[id number]. Topic: Clinical - Medication Question >> Mar 08, 2024  1:17 PM Dedra B wrote: Reason for CRM: Pt daughter tested positive for strep throat. Pt is also having sore throat and wants to know if something can be called in for her. Reason for Disposition  [1] Strep throat EXPOSURE within past 10 days AND [2] sore throat  Answer Assessment - Initial Assessment Questions 1. STREP EXPOSURE: Was the exposure to someone who lives within your home? If not, ask: How much contact did you have with the sick person?      Daughter tested positive for strep today 2. ONSET: How many days ago did the contact occur?      Last night 3. PROVEN STREP: Are you sure the person with strep had a positive throat culture or rapid strep test?      Positive test 4. STREP SYMPTOMS: Do YOU have a sore throat, fever, or other symptoms suggestive of strep?      Sore throat, swollen glands.  5. VIRAL SYMPTOMS: Are there any symptoms of a cold, such as a runny nose, cough, hoarse voice?     no 6. PREGNANCY: Is there any chance you are pregnant? When was your last menstrual period?     no  Protocols used: Strep Throat Exposure-A-AH

## 2024-03-10 ENCOUNTER — Encounter: Admitting: Physical Therapy

## 2024-03-24 ENCOUNTER — Ambulatory Visit: Admitting: Physical Therapy

## 2024-03-24 DIAGNOSIS — M542 Cervicalgia: Secondary | ICD-10-CM | POA: Diagnosis not present

## 2024-03-24 DIAGNOSIS — R293 Abnormal posture: Secondary | ICD-10-CM | POA: Diagnosis not present

## 2024-03-24 DIAGNOSIS — M6281 Muscle weakness (generalized): Secondary | ICD-10-CM | POA: Diagnosis not present

## 2024-03-24 NOTE — Therapy (Addendum)
 OUTPATIENT PHYSICAL THERAPY TREATMENT   Patient Name: Kelly Navarro MRN: [id number] DOB:March 08, 1986, 38 y.o., female Today's Date: 03/24/2024  END OF SESSION:  PT End of Session - 04/01/24 1117     Visit Number 3    Number of Visits 8    Date for Recertification  03/28/24    Authorization Type [employer]    PT Start Time 1605    PT Stop Time 1650    PT Time Calculation (min) 45 min    Activity Tolerance Patient tolerated treatment well    Behavior During Therapy [facility] for tasks assessed/performed            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   Eczema    Gaucher disease, type I (HCC)    noted on [facility] genetic testing 05/2015   GERD (gastroesophageal reflux disease)    Headache(784.0)    menstrual    IBS (irritable bowel syndrome)    Incomplete miscarriage 02/22/2019   Scoliosis    very mild   Past Surgical History:  Procedure Laterality Date   CESAREAN SECTION N/A 10/21/2016   Procedure: CESAREAN SECTION;  Surgeon: Duwaine Blumenthal, DO;  Location: [facility];  Service: Obstetrics;  Laterality: N/A;   CESAREAN SECTION N/A 03/22/2020   Procedure: CESAREAN SECTION;  Surgeon: Kandyce Sor, MD;  Location: [facility];  Service: Obstetrics;  Laterality: N/A;   DILATION AND EVACUATION N/A 02/22/2019   Procedure: DILATATION AND EVACUATION;  Surgeon: Mat Browning, MD;  Location: [facility] OR;  Service: Gynecology;  Laterality: N/A;   GUM SURGERY     OTHER SURGICAL HISTORY  2020   septate uterus, polyp   WISDOM TOOTH EXTRACTION     Patient Active Problem List   Diagnosis Date Noted   History of spontaneous abortion 07/17/2023   Polyp of endometrium 07/17/2023   Subchorionic hematoma  04/08/2023   Uterus arcuatus 04/08/2023   Human papilloma virus infection 04/08/2023   Thrombophilia 04/08/2023   Menorrhagia 01/27/2023   Vaginal irritation 01/27/2023   Obesity (BMI 30.0-34.9) 05/26/2022   Influenza A (H1N1) 06/05/2021   Mood disorder 11/02/2020   Anxiety 10/08/2020   Thrombophilia affecting pregnancy, antepartum (PAI) 03/25/2020   Postpartum care following cesarean delivery (9/23) 03/23/2020   Placental abruption in third trimester 03/23/2020   History of cesarean section 10/21/2016   Gaucher disease (HCC) 07/10/2015    PCP: Allwardt, Mardy HERO, PA-C  REFERRING PROVIDER: Allwardt, Mardy HERO, PA-C  REFERRING DIAG: M54.2 (ICD-10-CM) - Posterior neck pain  THERAPY DIAG:  Cervicalgia  Muscle weakness (generalized)  Abnormal posture  Rationale for Evaluation and Treatment: Rehabilitation  ONSET DATE: 10 years, worsening in the past 6 months  SUBJECTIVE:  SUBJECTIVE STATEMENT: Pt continues to report bothersome pain for about 6 months. She is having more headaches, behind the eyes, not too much at base of neck. She has been referred to neurology, but has not had visit yet. Her jaw is also painful, painful clicking with full opening at times. She did obtain a night guard, but has not worn it yet.    EVAL: Pt reports headaches that feels that come from her shoulders and neck. Has been worsening recently. Feels very tight and radiates into her jaw. Sometimes her jaw is so sensitive that even talking it feels like things will tense up. Did dry needling in the past which had helped ~3 years ago. Feels it may be worse on the left side of her neck. Has a theracane at home that she uses. Does have a cranio cradle for suboccipitals. Pt does work out in group fitness 5x/wk -- 2  days upper body.  Gets Zepbound  injections which can trigger headaches Hand dominance: Right  PERTINENT HISTORY:  Chronic neck pain, pelvic floor therapy in the past, history of low back pain  PAIN:  Are you having pain? Yes: NPRS scale: 7-8 tension, currently Pain location: side and posterior neck Pain description: Headaches, tightness, achiness Aggravating factors: AM, end of menstrual cycle Relieving factors: Dry needling  Headache 3/10 Can be different places   PRECAUTIONS: None  RED FLAGS: None   WEIGHT BEARING RESTRICTIONS: No  FALLS:  Has patient fallen in last 6 months? 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  DIAGNOSTIC FINDINGS:  None seen on Epic  PATIENT SURVEYS:  Neck Disability Index: 8 / 50 = 16.0 %  COGNITION: Overall cognitive status: Within functional limits for tasks assessed  SENSATION: WFL  POSTURE: rounded shoulders, forward head, and increased thoracic kyphosis  PALPATION: Most TTP bilat UTs, suboccipitals, semispinalis, masseters  CERVICAL ROM:   Active ROM A/PROM (deg) eval  Flexion 45 stretches bilat  Extension 35 some pain along R neck and into jaw  Right lateral flexion 35 stretch  Left lateral flexion 30 stretch  Right rotation 50  Left rotation 55   (Blank rows = not tested)  UPPER EXTREMITY ROM: WNL throughout  Active ROM Right eval Left eval  Shoulder flexion    Shoulder extension    Shoulder abduction    Shoulder adduction    Shoulder extension    Shoulder internal rotation    Shoulder external rotation    Elbow flexion    Elbow extension     Wrist flexion    Wrist extension    Wrist ulnar deviation    Wrist radial deviation    Wrist pronation    Wrist supination     (Blank rows = not tested)  UPPER EXTREMITY MMT:  MMT Right eval Left eval  Shoulder flexion 5 5  Shoulder extension    Shoulder abduction 5 5  Shoulder adduction    Shoulder extension in prone 3+ pain 3+ pain  Shoulder internal rotation 5 5  Shoulder external rotation 4- with some pain on R 4-  Middle trapezius 4- 3+   (Blank rows = not tested)  CERVICAL SPECIAL TESTS:  Did not assess  FUNCTIONAL TESTS:  Did not assess  TREATMENT DATE:   03/24/24: Therapeutic Exercise: review of controlled opening and posture Aerobic: Supine:  SA reaches for shoulder blade Seated: Standing: Stretches:  Neuromuscular Re-education: Manual Therapy: manual traction;  Upper trap and levator stretching, Cervical Pa mobs, SOR.  Therapeutic Activity: Self Care: education on use of night guard for grinding, and tmj, current symptoms, scoliosis, and posture  Trigger Point Dry Needling  Subsequent Treatment: Instructions reviewed, if requested by the patient, prior to subsequent dry needling treatment.   Patient Verbal Consent Given: Yes Education Handout Provided: Previously Provided Muscles Treated: R mid trap/ med scap border;  Bil UT Electrical Stimulation Performed: No Treatment Response/Outcome: palpable increase in muscle length, twitch response ;      02/23/24 Manual STM with compression to bil masseters, suboccipitals and cervical paraspinals; skilled palpation and monitoring of soft tissue during DN  TherEx Rocabado 6 - demonstrated with trial reps PRN for comprehension  Trigger Point Dry Needling  Initial Treatment: Pt instructed on Dry Needling rational, procedures, and possible side effects. Pt instructed to expect mild to moderate muscle soreness later in the day and/or into the next day.  Pt instructed in methods to reduce muscle  soreness. Pt instructed to continue prescribed HEP. Patient was educated on signs and symptoms of infection and other risk factors and advised to seek medical attention should they occur.  Patient verbalized understanding of these instructions and education.   Patient Verbal Consent Given: Yes Education Handout Provided: Yes Muscles Treated: bil masseter, bil lateral pterygoid, bil suboccipitals Electrical Stimulation Performed: No Treatment Response/Outcome: twitch responses, increased soreness noted    02/01/24 See HEP below Self care: Posture and ergonomics of head/shoulder positioning with her work outs; trigger point releases for cervical muscles  PATIENT EDUCATION:  Education details: Exam findings, POC, initial HEP, trigger points, dry needling, tracking headache triggers  Person educated: Patient Education method: Explanation Education comprehension: verbalized understanding, returned demonstration, and needs further education  HOME EXERCISE PROGRAM: Access Code: [email or web address] URL: [email or web address] Date: 02/01/2024 Prepared by: Gellen April Earnie Starring  Exercises - Standing Cervical Retraction  - 3-5 x daily - 7 x weekly - 2 sets - 10 reps - 3 sec hold - Shoulder External Rotation and Scapular Retraction  - 3-5 x daily - 7 x weekly - 2 sets - 10 reps - Seated Gentle Upper Trapezius Stretch  - 1 x daily - 7 x weekly - 2 sets - 30 sec hold - Sub-Occipital Cervical Stretch  - 1 x daily - 7 x weekly - 2 sets - 30 sec hold  ASSESSMENT:  CLINICAL IMPRESSION: Pt with tenderness in temporalis and masseters with palpation today. We reviewed controlled opening for TMJ exercises, as well as recommendation to try the night guard for possible grinding. We discussed scoliosis as being a factor but likely not what is driving her pain. Repeated dry needling today due to + response in the past. She will benefit from continued care for neck and jaw pain, plan to review more  postural strengthening next visit.    OBJECTIVE IMPAIRMENTS: decreased activity tolerance, decreased coordination, decreased endurance, decreased mobility, decreased ROM, decreased strength, increased fascial restrictions, increased muscle spasms, improper body mechanics, postural dysfunction, and pain.   ACTIVITY LIMITATIONS: carrying, lifting, toileting, dressing, reach over head, hygiene/grooming, and caring for others  PARTICIPATION LIMITATIONS: driving, community activity, and occupation  PERSONAL FACTORS: Age, Fitness, Past/current experiences, and Time since onset of injury/illness/exacerbation are also affecting  patient's functional outcome.   REHAB POTENTIAL: Good  CLINICAL DECISION MAKING: Evolving/moderate complexity  EVALUATION COMPLEXITY: Moderate   GOALS: Goals reviewed with patient? Yes  SHORT TERM GOALS: Target date: 02/29/2024   Pt will be ind with initial HEP Baseline:  Goal status: MET  2.  Pt will report reduction in pain and headaches by >/=25% Baseline:  Goal status: In progress    LONG TERM GOALS: Target date: 03/28/2024   Pt will be ind with management and progression of HEP Baseline:  Goal status: INITIAL  2.  Pt will have full and pain free cervical ROM Baseline:  Goal status: INITIAL  3.  Pt will demo at least 4/5 posterior shoulder strength for decreased UT/neck muscle compensation with activities Baseline:  Goal status: INITIAL  4.  Pt will report improved pain and headaches by >/=50% Baseline:  Goal status: INITIAL  5.  Pt will have improved NDI to </=6% to demo MCID Baseline: 16% Goal status: INITIAL    PLAN:  PT FREQUENCY: 1x/week  PT DURATION: 8 weeks  PLANNED INTERVENTIONS: 97164- PT Re-evaluation, 97110-Therapeutic exercises, 97530- Therapeutic activity, 97112- Neuromuscular re-education, 97535- Self Care, 02859- Manual therapy, [id number]- Electrical stimulation (unattended), [phone]- Ionotophoresis 4mg /ml Dexamethasone , 79439  (1-2 muscles), 20561 (3+ muscles)- Dry Needling, Patient/Family education, Taping, Joint mobilization, Spinal mobilization, Cryotherapy, and Moist heat  PLAN FOR NEXT SESSION: check STGs, repeat DN PRN,  Assess response to HEP. Progress posterior shoulder girdle strengthening. Dry needling/manual therapy as indicated. Consider Rocabado exercises for jaw pain   Tinnie Don, PT, DPT 11:22 AM  04/01/24

## 2024-03-25 ENCOUNTER — Other Ambulatory Visit: Payer: Self-pay | Admitting: Physician Assistant

## 2024-03-25 DIAGNOSIS — L309 Dermatitis, unspecified: Secondary | ICD-10-CM

## 2024-04-01 ENCOUNTER — Other Ambulatory Visit: Payer: Self-pay | Admitting: Physician Assistant

## 2024-04-01 ENCOUNTER — Encounter: Payer: Self-pay | Admitting: Physical Therapy

## 2024-04-01 DIAGNOSIS — E663 Overweight: Secondary | ICD-10-CM

## 2024-04-05 ENCOUNTER — Encounter: Payer: Self-pay | Admitting: Physician Assistant

## 2024-04-05 ENCOUNTER — Ambulatory Visit: Admitting: Physician Assistant

## 2024-04-05 VITALS — BP 102/74 | HR 85 | Temp 97.5°F | Ht 67.5 in | Wt 161.8 lb

## 2024-04-05 DIAGNOSIS — F32A Depression, unspecified: Secondary | ICD-10-CM

## 2024-04-05 DIAGNOSIS — E538 Deficiency of other specified B group vitamins: Secondary | ICD-10-CM | POA: Diagnosis not present

## 2024-04-05 DIAGNOSIS — F331 Major depressive disorder, recurrent, moderate: Secondary | ICD-10-CM | POA: Diagnosis not present

## 2024-04-05 DIAGNOSIS — F410 Panic disorder [episodic paroxysmal anxiety] without agoraphobia: Secondary | ICD-10-CM | POA: Diagnosis not present

## 2024-04-05 DIAGNOSIS — F4312 Post-traumatic stress disorder, chronic: Secondary | ICD-10-CM | POA: Diagnosis not present

## 2024-04-05 DIAGNOSIS — R519 Headache, unspecified: Secondary | ICD-10-CM | POA: Diagnosis not present

## 2024-04-05 DIAGNOSIS — Z7689 Persons encountering health services in other specified circumstances: Secondary | ICD-10-CM

## 2024-04-05 DIAGNOSIS — E611 Iron deficiency: Secondary | ICD-10-CM | POA: Diagnosis not present

## 2024-04-05 DIAGNOSIS — F419 Anxiety disorder, unspecified: Secondary | ICD-10-CM

## 2024-04-05 DIAGNOSIS — E559 Vitamin D deficiency, unspecified: Secondary | ICD-10-CM

## 2024-04-05 LAB — CBC WITH DIFFERENTIAL/PLATELET
Basophils Absolute: 0.1 K/uL (ref 0.0–0.1)
Basophils Relative: 0.9 % (ref 0.0–3.0)
Eosinophils Absolute: 0.1 K/uL (ref 0.0–0.7)
Eosinophils Relative: 2.2 % (ref 0.0–5.0)
HCT: 37.7 % (ref 36.0–46.0)
Hemoglobin: 12.5 g/dL (ref 12.0–15.0)
Lymphocytes Relative: 21.9 % (ref 12.0–46.0)
Lymphs Abs: 1.3 K/uL (ref 0.7–4.0)
MCHC: 33.2 g/dL (ref 30.0–36.0)
MCV: 86.3 fl (ref 78.0–100.0)
Monocytes Absolute: 0.4 K/uL (ref 0.1–1.0)
Monocytes Relative: 7.4 % (ref 3.0–12.0)
Neutro Abs: 4 K/uL (ref 1.4–7.7)
Neutrophils Relative %: 67.6 % (ref 43.0–77.0)
Platelets: 243 K/uL (ref 150.0–400.0)
RBC: 4.36 Mil/uL (ref 3.87–5.11)
RDW: 14.2 % (ref 11.5–15.5)
WBC: 5.9 K/uL (ref 4.0–10.5)

## 2024-04-05 LAB — COMPREHENSIVE METABOLIC PANEL WITH GFR
ALT: 10 U/L (ref 0–35)
AST: 14 U/L (ref 0–37)
Albumin: 4.7 g/dL (ref 3.5–5.2)
Alkaline Phosphatase: 48 U/L (ref 39–117)
BUN: 10 mg/dL (ref 6–23)
CO2: 25 meq/L (ref 19–32)
Calcium: 10.2 mg/dL (ref 8.4–10.5)
Chloride: 105 meq/L (ref 96–112)
Creatinine, Ser: 0.75 mg/dL (ref 0.40–1.20)
GFR: 100.81 mL/min
Glucose, Bld: 71 mg/dL (ref 70–99)
Potassium: 5.2 meq/L — ABNORMAL HIGH (ref 3.5–5.1)
Sodium: 138 meq/L (ref 135–145)
Total Bilirubin: 0.4 mg/dL (ref 0.2–1.2)
Total Protein: 7.8 g/dL (ref 6.0–8.3)

## 2024-04-05 LAB — IBC + FERRITIN
Ferritin: 19.8 ng/mL (ref 10.0–291.0)
Iron: 82 ug/dL (ref 42–145)
Saturation Ratios: 28 % (ref 20.0–50.0)
TIBC: 292.6 ug/dL (ref 250.0–450.0)
Transferrin: 209 mg/dL — ABNORMAL LOW (ref 212.0–360.0)

## 2024-04-05 LAB — VITAMIN B12: Vitamin B-12: 531 pg/mL (ref 211–911)

## 2024-04-05 LAB — VITAMIN D 25 HYDROXY (VIT D DEFICIENCY, FRACTURES): VITD: 36.27 ng/mL (ref 30.00–100.00)

## 2024-04-05 NOTE — Progress Notes (Signed)
 Patient ID: Kelly Navarro, female    DOB: 1986/05/13, 38 y.o.   MRN: [id number]   Assessment & Plan:  Encounter for weight management -     CBC with Differential/Platelet -     Vitamin B12 -     Comprehensive metabolic panel with GFR  Anxiety and depression  Vitamin D  deficiency -     VITAMIN D  25 Hydroxy (Vit-D Deficiency, Fractures)  Low iron  -     IBC + Ferritin -     CBC with Differential/Platelet  B12 deficiency -     Vitamin B12  Frequent headaches      Assessment and Plan Assessment & Plan Obesity and weight management with GLP-1 agonist therapy (Zepbound ) and associated adverse effects (diarrhea, fatigue) Significant weight loss achieved with Zepbound  therapy, but experiencing adverse effects including diarrhea and fatigue, particularly at higher doses. Concerns about potential disordered eating patterns and the impact of medication on overall well-being. - Skip or extend the next dose of Zepbound  to Sunday. - Continue with 5 mg dose of Zepbound . - Consider reducing to 2.5 mg dose if side effects persist. - Monitor for improvement in side effects with dose adjustment. - Consider probiotic for gut health.  Headache Intermittent headaches with some described as pulsing behind the eye, suggestive of possible migraine. Discussion about potential structural causes and the impact of current medication and nutritional intake on headache frequency and severity. - Continue physical therapy for neck and shoulder tension. - Consider referral to headache specialist if headaches persist after other changes. - Monitor nutritional intake and adjust as needed to ensure adequate caloric and nutrient intake.  Depression and anxiety Currently managed with Zoloft and Wellbutrin, with improvement noted. Appointment with psychiatrist scheduled to discuss potential adjustments, including possible titration down of Buspar . - Continue current psychiatric medications. - Discuss  potential adjustments with psychiatrist.  Iron  deficiency anemia Previous iron  deficiency anemia with low ferritin levels. Current nutritional intake may be impacting iron  levels. - Recheck iron  levels and CBC.  Vitamin D  deficiency Previous vitamin D  deficiency. Current nutritional intake may be impacting vitamin D  levels. - Recheck vitamin D  levels.    F/up as scheduled     Subjective:    Chief Complaint  Patient presents with   Medical Management of Chronic Issues    3 Month follow up, zepbound . Has been tolerating well outside of fatigue and GI upset (diarrhea)    HPI Discussed the use of AI scribe software for clinical note transcription with the patient, who gave verbal consent to proceed.  History of Present Illness Kelly Navarro Kelly Navarro is a 38 year old female who presents for a three-month follow-up on weight management.  She has been on a weight management program since May, starting at a weight of 198 pounds. By July, her weight decreased to 183 pounds, and as of today, she weighs 161 pounds. She has been using Zepbound , initially at a dose of 7.5 mg once weekly, but has recently reduced her dose to 0.5 mg and is unsure if this is her second or third week on the lower dose, to assist with weight loss.  She experiences increased fatigue and gastrointestinal side effects, including diarrhea, over the past two to three weeks. Bowel movements are described as 'very watery' or 'looser', with no normal bowel movement in two weeks. Stomach discomfort resolves after using the bathroom.  She feels hungrier than usual and finds eating difficult, leading to feelings of depletion. She questions whether she  is 'starving' herself due to low calorie intake. She felt the worst on the 7.5 mg dose and has since reduced to 0.5 mg, unsure if this is her second or third week on the lower dose.  She is working with her psychiatrist on anxiety and depression, currently taking Zoloft and  Wellbutrin, which have improved her condition. She is also on Buspar . She takes several vitamins and supplements, including D3, B12, and iron , due to previously low levels of vitamin D  and iron .  She experiences headaches, sometimes described as 'pulsing behind my eye', and wonders if they are related to her medication or other factors such as nutrition. She has been undergoing physical therapy for neck and shoulder tension, which includes dry needling, and suspects she may be clenching her jaw at night.  She has a history of yeast infections and takes a live probiotic for vaginal and gut health every other day.     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   Eczema    Gaucher disease, type I (HCC)    noted on [facility] genetic testing 05/2015   GERD (gastroesophageal reflux disease)    Headache(784.0)    menstrual    IBS (irritable bowel syndrome)    Incomplete miscarriage 02/22/2019   Scoliosis    very mild    Past Surgical History:  Procedure Laterality Date   CESAREAN SECTION N/A 10/21/2016   Procedure: CESAREAN SECTION;  Surgeon: Duwaine Blumenthal, DO;  Location: [facility];  Service: Obstetrics;  Laterality: N/A;   CESAREAN SECTION N/A 03/22/2020   Procedure: CESAREAN SECTION;  Surgeon: Kandyce Sor, MD;  Location: [facility];  Service: Obstetrics;  Laterality: N/A;   DILATION AND EVACUATION N/A 02/22/2019   Procedure: DILATATION AND EVACUATION;  Surgeon: Mat Browning, MD;  Location: [facility] OR;  Service: Gynecology;  Laterality: N/A;   GUM SURGERY     OTHER SURGICAL HISTORY  2020   septate uterus, polyp   WISDOM TOOTH EXTRACTION      Family History  Problem Relation Age of Onset    Hypertension Mother    Non-Hodgkin's lymphoma Mother        currently in remission   Cancer Mother    Healthy Father    Anxiety disorder Sister    Depression Sister    Asthma Sister    Eczema Sister    Diabetes Maternal Grandfather    Hypertension Maternal Grandfather    Heart disease Maternal Grandfather    Breast cancer Paternal Grandmother        Age 67   Cancer Paternal Grandfather        multiple myeloma   Mood Disorder Paternal Grandfather     Social History   Tobacco Use   Smoking status: Never   Smokeless tobacco: Never  Vaping Use   Vaping status: Never Used  Substance Use Topics   Alcohol use: Not Currently    Alcohol/week: 1.0 standard drink of alcohol    Types: 1 Standard drinks or equivalent per week    Comment: Occasionally   Drug use: No     Allergies  Allergen Reactions   Amoxicillin Hives    Has patient had a PCN reaction causing immediate rash, facial/tongue/throat swelling,  SOB or lightheadedness with hypotension: Yes Has patient had a PCN reaction causing severe rash involving mucus membranes or skin necrosis: No Has patient had a PCN reaction that required hospitalization No Has patient had a PCN reaction occurring within the last 10 years: No If all of the above answers are NO, then may proceed with Cephalosporin use.   Bee Venom Other (See Comments)    Tongue swelling   Macrodantin  [Nitrofurantoin  Macrocrystal] Nausea And Vomiting   Nitrofurantoin  Other (See Comments) and Nausea Only   Penicillins Hives    Has patient had a PCN reaction causing immediate rash, facial/tongue/throat swelling, SOB or lightheadedness with hypotension: Yes  Has patient had a PCN reaction causing severe rash involving mucus membranes or skin necrosis: No  Has patient had a PCN reaction that required hospitalization No  Has patient had a PCN reaction occurring within the last 10 years: No  If all of the above answers are NO, then may proceed with  Cephalosporin use.  Has patient had a PCN reaction causing immediate rash, facial/tongue/throat swelling, SOB or lightheadedness with hypotension: Yes  Has patient had a PCN reaction causing severe rash involving mucus membranes or skin necrosis: No  Has patient had a PCN reaction that required hospitalization No  Has patient had a PCN reaction occurring within the last 10 years: No  If all of the above answers are NO, then may proceed with Cephalosporin use.  Has patient had a PCN reaction causing immediate rash, facial/tongue/throat swelling, SOB or lightheadedness with hypotension: Yes  Has patient had a PCN reaction causing severe rash involving mucus membranes or skin necrosis: No  Has patient had a PCN reaction that required hospitalization No  Has patient had a PCN reaction occurring within the last 10 years: No  If all of the above answers are NO, then may proceed with Cephalosporin use.   Pineapple Swelling    Itchy throat  Itchy throat  Itchy throat   Sulfa Antibiotics Hives    Review of Systems NEGATIVE UNLESS OTHERWISE INDICATED IN HPI      Objective:     BP 102/74   Pulse 85   Temp (!) 97.5 F (36.4 C)   Ht 5' 7.5 (1.715 m)   Wt 161 lb 12.8 oz (73.4 kg)   SpO2 99%   BMI 24.97 kg/m   Wt Readings from Last 3 Encounters:  04/05/24 161 lb 12.8 oz (73.4 kg)  01/04/24 183 lb 9.6 oz (83.3 kg)  11/02/23 198 lb 6.4 oz (90 kg)    BP Readings from Last 3 Encounters:  04/05/24 102/74  01/04/24 98/74  11/02/23 122/78     Physical Exam Vitals and nursing note reviewed.  Constitutional:      Appearance: Normal appearance. She is normal weight.  Eyes:     Extraocular Movements: Extraocular movements intact.     Conjunctiva/sclera: Conjunctivae normal.     Pupils: Pupils are equal, round, and reactive to light.  Cardiovascular:     Rate and Rhythm: Normal rate and regular rhythm.  Pulmonary:     Effort: Pulmonary effort is normal.  Musculoskeletal:      Right lower leg: No edema.     Left lower leg: No edema.  Skin:    Findings: No lesion or rash.  Neurological:     Mental Status: She is alert.  Psychiatric:        Mood and Affect: Mood normal.  Lizann Edelman M Regnia Mathwig, PA-C

## 2024-04-06 ENCOUNTER — Ambulatory Visit: Payer: Self-pay | Admitting: Physician Assistant

## 2024-04-06 NOTE — Telephone Encounter (Signed)
See pt response and advise

## 2024-04-08 ENCOUNTER — Encounter: Admitting: Physical Therapy

## 2024-04-08 ENCOUNTER — Other Ambulatory Visit: Payer: Self-pay

## 2024-04-08 DIAGNOSIS — R899 Unspecified abnormal finding in specimens from other organs, systems and tissues: Secondary | ICD-10-CM

## 2024-04-14 ENCOUNTER — Ambulatory Visit: Admitting: Physical Therapy

## 2024-04-14 ENCOUNTER — Encounter: Payer: Self-pay | Admitting: Physical Therapy

## 2024-04-14 DIAGNOSIS — M542 Cervicalgia: Secondary | ICD-10-CM

## 2024-04-14 DIAGNOSIS — M6281 Muscle weakness (generalized): Secondary | ICD-10-CM | POA: Diagnosis not present

## 2024-04-14 NOTE — Therapy (Signed)
 OUTPATIENT PHYSICAL THERAPY TREATMENT   Patient Name: Kelly Navarro MRN: [id number] DOB:1986/06/18, 38 y.o., female Today's Date: 04/14/2024    END OF SESSION:  PT End of Session - 04/14/24 1705     Visit Number 4    Number of Visits 8    Date for Recertification  03/28/24    Authorization Type [employer]    PT Start Time 1407    PT Stop Time 1452    PT Time Calculation (min) 45 min    Activity Tolerance Patient tolerated treatment well    Behavior During Therapy [facility] for tasks assessed/performed             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   Eczema    Gaucher disease, type I (HCC)    noted on [facility] genetic testing 05/2015   GERD (gastroesophageal reflux disease)    Headache(784.0)    menstrual    IBS (irritable bowel syndrome)    Incomplete miscarriage 02/22/2019   Scoliosis    very mild   Past Surgical History:  Procedure Laterality Date   CESAREAN SECTION N/A 10/21/2016   Procedure: CESAREAN SECTION;  Surgeon: Duwaine Blumenthal, DO;  Location: [facility];  Service: Obstetrics;  Laterality: N/A;   CESAREAN SECTION N/A 03/22/2020   Procedure: CESAREAN SECTION;  Surgeon: Kandyce Sor, MD;  Location: [facility];  Service: Obstetrics;  Laterality: N/A;   DILATION AND EVACUATION N/A 02/22/2019   Procedure: DILATATION AND EVACUATION;  Surgeon: Mat Browning, MD;  Location: [facility] OR;  Service: Gynecology;  Laterality: N/A;   GUM SURGERY     OTHER SURGICAL HISTORY  2020   septate uterus, polyp   WISDOM TOOTH EXTRACTION     Patient Active Problem List   Diagnosis Date Noted   History of spontaneous abortion 07/17/2023   Polyp of endometrium 07/17/2023    Subchorionic hematoma 04/08/2023   Uterus arcuatus 04/08/2023   Human papilloma virus infection 04/08/2023   Thrombophilia 04/08/2023   Menorrhagia 01/27/2023   Vaginal irritation 01/27/2023   Obesity (BMI 30.0-34.9) 05/26/2022   Influenza A (H1N1) 06/05/2021   Mood disorder 11/02/2020   Anxiety 10/08/2020   Thrombophilia affecting pregnancy, antepartum (PAI) 03/25/2020   Postpartum care following cesarean delivery (9/23) 03/23/2020   Placental abruption in third trimester 03/23/2020   History of cesarean section 10/21/2016   Gaucher disease (HCC) 07/10/2015    PCP: Allwardt, Mardy HERO, PA-C  REFERRING PROVIDER: Allwardt, Mardy HERO, PA-C  REFERRING DIAG: M54.2 (ICD-10-CM) - Posterior neck pain  THERAPY DIAG:  Cervicalgia  Muscle weakness (generalized)  Rationale for Evaluation and Treatment: Rehabilitation  ONSET DATE: 10 years, worsening in the past 6 months  SUBJECTIVE:  SUBJECTIVE STATEMENT: Pt states mild improvements in neck pain and jaw pain. Headaches have been less frequent. She has been doing HEP and also wearing night guard. Neck continues to feel tight and sore.     EVAL: Pt reports headaches that feels that come from her shoulders and neck. Has been worsening recently. Feels very tight and radiates into her jaw. Sometimes her jaw is so sensitive that even talking it feels like things will tense up. Did dry needling in the past which had helped ~3 years ago. Feels it may be worse on the left side of her neck. Has a theracane at home that she uses. Does have a cranio cradle for suboccipitals. Pt does work out in group fitness 5x/wk -- 2 days upper body.  Gets Zepbound  injections which can trigger headaches Hand dominance: Right  PERTINENT HISTORY:  Chronic neck pain,  pelvic floor therapy in the past, history of low back pain  PAIN:  Are you having pain? Yes: NPRS scale: 7-8 tension, currently Pain location: side and posterior neck Pain description: Headaches, tightness, achiness Aggravating factors: AM, end of menstrual cycle Relieving factors: Dry needling  Headache 3/10 Can be different places   PRECAUTIONS: None  RED FLAGS: None   WEIGHT BEARING RESTRICTIONS: No  FALLS:  Has patient fallen in last 6 months? 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  DIAGNOSTIC FINDINGS:  None seen on Epic  PATIENT SURVEYS:  Neck Disability Index: 8 / 50 = 16.0 %  COGNITION: Overall cognitive status: Within functional limits for tasks assessed  SENSATION: WFL  POSTURE: rounded shoulders, forward head, and increased thoracic kyphosis  PALPATION: Most TTP bilat UTs, suboccipitals, semispinalis, masseters  CERVICAL ROM:   Active ROM A/PROM (deg) eval  Flexion 45 stretches bilat  Extension 35 some pain along R neck and into jaw  Right lateral flexion 35 stretch  Left lateral flexion 30 stretch  Right rotation 50  Left rotation 55   (Blank rows = not tested)  UPPER EXTREMITY ROM: WNL throughout  Active ROM Right eval Left eval  Shoulder flexion    Shoulder extension    Shoulder abduction    Shoulder adduction    Shoulder extension    Shoulder internal rotation    Shoulder external rotation    Elbow flexion    Elbow extension    Wrist flexion    Wrist extension    Wrist ulnar deviation    Wrist radial deviation    Wrist pronation    Wrist supination      (Blank rows = not tested)  UPPER EXTREMITY MMT:  MMT Right eval Left eval  Shoulder flexion 5 5  Shoulder extension    Shoulder abduction 5 5  Shoulder adduction    Shoulder extension in prone 3+ pain 3+ pain  Shoulder internal rotation 5 5  Shoulder external rotation 4- with some pain on R 4-  Middle trapezius 4- 3+   (Blank rows = not tested)  CERVICAL SPECIAL TESTS:  Did not assess  FUNCTIONAL TESTS:  Did not assess  TREATMENT DATE:  04/14/24  Therapeutic Exercise:  Aerobic: Supine:   Quadruped:  SA press  with chin tuck x 10;  Seated: Standing: Row Blue TB x 15,  Low row GTB x 15;  bent over row 8lb x 10- education and cueing for mechanics with all.  Stretches:  Neuromuscular Re-education: Manual Therapy: manual traction;  Upper trap and levator stretching, Cervical and thoracic Pa mobs, SOR, STM to R paraspinals.  Therapeutic Activity: Self Care: education     03/24/24: Therapeutic Exercise: review of controlled opening and posture Aerobic: Supine:  SA reaches for shoulder blade Seated: Standing: Stretches:  Neuromuscular Re-education: Manual Therapy: manual traction;  Upper trap and levator stretching, Cervical Pa mobs, SOR.  Therapeutic Activity: Self Care: education on use of night guard for grinding, and tmj, current symptoms, scoliosis, and posture  Trigger Point Dry Needling  Subsequent Treatment: Instructions reviewed, if requested by the patient, prior to subsequent dry needling treatment.   Patient Verbal Consent Given: Yes Education Handout Provided: Previously Provided Muscles Treated: R mid trap/ med scap border;  Bil UT Electrical Stimulation Performed: No Treatment Response/Outcome: palpable increase in muscle length, twitch response ;      02/23/24 Manual STM with compression to bil masseters, suboccipitals and cervical paraspinals; skilled palpation and monitoring of soft tissue during DN  TherEx Rocabado 6 - demonstrated with  trial reps PRN for comprehension  Trigger Point Dry Needling  Initial Treatment: Pt instructed on Dry Needling rational, procedures, and possible side effects. Pt instructed to expect mild to moderate muscle soreness later in the day and/or into the next day.  Pt instructed in methods to reduce muscle soreness. Pt instructed to continue prescribed HEP. Patient was educated on signs and symptoms of infection and other risk factors and advised to seek medical attention should they occur.  Patient verbalized understanding of these instructions and education.   Patient Verbal Consent Given: Yes Education Handout Provided: Yes Muscles Treated: bil masseter, bil lateral pterygoid, bil suboccipitals Electrical Stimulation Performed: No Treatment Response/Outcome: twitch responses, increased soreness noted    02/01/24 See HEP below Self care: Posture and ergonomics of head/shoulder positioning with her work outs; trigger point releases for cervical muscles  PATIENT EDUCATION:  Education details: Exam findings, POC, initial HEP, trigger points, dry needling, tracking headache triggers  Person educated: Patient Education method: Explanation Education comprehension: verbalized understanding, returned demonstration, and needs further education  HOME EXERCISE PROGRAM: Access Code: [email or web address] URL: [email or web address] Date: 02/01/2024 Prepared by: Gellen April Earnie Starring  Exercises - Standing Cervical Retraction  - 3-5 x daily - 7 x weekly - 2 sets - 10 reps - 3 sec hold - Shoulder External Rotation and Scapular Retraction  - 3-5 x daily - 7 x weekly - 2 sets - 10 reps - Seated Gentle Upper Trapezius Stretch  - 1 x daily - 7 x weekly - 2 sets - 30 sec hold - Sub-Occipital Cervical Stretch  - 1 x daily - 7 x weekly - 2 sets - 30 sec hold  ASSESSMENT:  CLINICAL IMPRESSION: Pt with less trigger points in UT s. She does have muscle tension in R paraspinals and sub occipitals,  addressed with manual today. Education for mechanics for postural strengthening and gym activities. Pt with difficulty with neck tension and soreness in upper/mid t-spine with scap squeeze motion. Recommended continued use of night guard for jaw pain. Pt to benefit from continued care.   Pt with tenderness in temporalis and masseters with palpation today. We reviewed controlled opening for TMJ exercises, as well as recommendation to try the night guard for possible  grinding. We discussed scoliosis as being a factor but likely not what is driving her pain. Repeated dry needling today due to + response in the past. She will benefit from continued care for neck and jaw pain, plan to review more postural strengthening next visit.    OBJECTIVE IMPAIRMENTS: decreased activity tolerance, decreased coordination, decreased endurance, decreased mobility, decreased ROM, decreased strength, increased fascial restrictions, increased muscle spasms, improper body mechanics, postural dysfunction, and pain.   ACTIVITY LIMITATIONS: carrying, lifting, toileting, dressing, reach over head, hygiene/grooming, and caring for others  PARTICIPATION LIMITATIONS: driving, community activity, and occupation  PERSONAL FACTORS: Age, Fitness, Past/current experiences, and Time since onset of injury/illness/exacerbation are also affecting patient's functional outcome.   REHAB POTENTIAL: Good  CLINICAL DECISION MAKING: Evolving/moderate complexity  EVALUATION COMPLEXITY: Moderate   GOALS: Goals reviewed with patient? Yes  SHORT TERM GOALS: Target date: 02/29/2024   Pt will be ind with initial HEP Baseline:  Goal status: MET  2.  Pt will report reduction in pain and headaches by >/=25% Baseline:  Goal status: In progress    LONG TERM GOALS: Target date: 03/28/2024   Pt will be ind with management and progression of HEP Baseline:  Goal status: INITIAL  2.  Pt will have full and pain free cervical ROM Baseline:   Goal status: INITIAL  3.  Pt will demo at least 4/5 posterior shoulder strength for decreased UT/neck muscle compensation with activities Baseline:  Goal status: INITIAL  4.  Pt will report improved pain and headaches by >/=50% Baseline:  Goal status: INITIAL  5.  Pt will have improved NDI to </=6% to demo MCID Baseline: 16% Goal status: INITIAL    PLAN:  PT FREQUENCY: 1x/week  PT DURATION: 8 weeks  PLANNED INTERVENTIONS: 97164- PT Re-evaluation, 97110-Therapeutic exercises, 97530- Therapeutic activity, 97112- Neuromuscular re-education, 97535- Self Care, [id number]- Manual therapy, [id number]- Electrical stimulation (unattended), [phone]- Ionotophoresis 4mg /ml Dexamethasone , 79439 (1-2 muscles), 20561 (3+ muscles)- Dry Needling, Patient/Family education, Taping, Joint mobilization, Spinal mobilization, Cryotherapy, and Moist heat  PLAN FOR NEXT SESSION: check STGs, repeat DN PRN,  Assess response to HEP. Progress posterior shoulder girdle strengthening. Dry needling/manual therapy as indicated. Consider Rocabado exercises for jaw pain Shoulder/postural strength, demo tricep extensions for pain , prone press up for thoracic and lumbar extension(difficulty sitting up straight)    Tinnie Don, PT, DPT 5:05 PM  04/14/24

## 2024-04-21 ENCOUNTER — Ambulatory Visit: Admitting: Physical Therapy

## 2024-04-21 ENCOUNTER — Encounter: Payer: Self-pay | Admitting: Physical Therapy

## 2024-04-21 DIAGNOSIS — R293 Abnormal posture: Secondary | ICD-10-CM | POA: Diagnosis not present

## 2024-04-21 DIAGNOSIS — M6281 Muscle weakness (generalized): Secondary | ICD-10-CM

## 2024-04-21 DIAGNOSIS — M542 Cervicalgia: Secondary | ICD-10-CM

## 2024-04-21 NOTE — Therapy (Signed)
 OUTPATIENT PHYSICAL THERAPY TREATMENT   Patient Name: Kelly Navarro MRN: [id number] DOB:[birth year], 38 y.o., female Today's Date: 04/21/2024    END OF SESSION:  PT End of Session - 04/21/24 1601     Visit Number 5    Number of Visits 8    Date for Recertification  06/02/24    Authorization Type [employer]    PT Start Time 1605    PT Stop Time 1645    PT Time Calculation (min) 40 min    Activity Tolerance Patient tolerated treatment well    Behavior During Therapy [facility] for tasks assessed/performed             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   Eczema    Gaucher disease, type I (HCC)    noted on [facility] genetic testing 05/2015   GERD (gastroesophageal reflux disease)    Headache(784.0)    menstrual    IBS (irritable bowel syndrome)    Incomplete miscarriage 02/22/2019   Scoliosis    very mild   Past Surgical History:  Procedure Laterality Date   CESAREAN SECTION N/A 10/21/2016   Procedure: CESAREAN SECTION;  Surgeon: Duwaine Blumenthal, DO;  Location: [facility];  Service: Obstetrics;  Laterality: N/A;   CESAREAN SECTION N/A 03/22/2020   Procedure: CESAREAN SECTION;  Surgeon: Kandyce Sor, MD;  Location: [facility];  Service: Obstetrics;  Laterality: N/A;   DILATION AND EVACUATION N/A 02/22/2019   Procedure: DILATATION AND EVACUATION;  Surgeon: Mat Browning, MD;  Location: [facility] OR;  Service: Gynecology;  Laterality: N/A;   GUM SURGERY     OTHER SURGICAL HISTORY  2020   septate uterus, polyp   WISDOM TOOTH EXTRACTION     Patient Active Problem List   Diagnosis Date Noted   History of spontaneous abortion 07/17/2023   Polyp of endometrium 07/17/2023    Subchorionic hematoma 04/08/2023   Uterus arcuatus 04/08/2023   Human papilloma virus infection 04/08/2023   Thrombophilia 04/08/2023   Menorrhagia 01/27/2023   Vaginal irritation 01/27/2023   Obesity (BMI 30.0-34.9) 05/26/2022   Influenza A (H1N1) 06/05/2021   Mood disorder 11/02/2020   Anxiety 10/08/2020   Thrombophilia affecting pregnancy, antepartum (PAI) 03/25/2020   Postpartum care following cesarean delivery (9/23) 03/23/2020   Placental abruption in third trimester 03/23/2020   History of cesarean section 10/21/2016   Gaucher disease (HCC) 07/10/2015    PCP: Allwardt, Mardy HERO, PA-C  REFERRING PROVIDER: Allwardt, Mardy HERO, PA-C  REFERRING DIAG: M54.2 (ICD-10-CM) - Posterior neck pain  THERAPY DIAG:  Cervicalgia  Muscle weakness (generalized)  Abnormal posture  Rationale for Evaluation and Treatment: Rehabilitation  ONSET DATE: 10 years, worsening in the past 6 months  SUBJECTIVE:  SUBJECTIVE STATEMENT:  States a few more headaches this week,  by end of the work day. Did get a massage on Sunday. Neck still feeling tight with work duties and exercise, more on L side this week. Is wearing night guard to bed.     EVAL: Pt reports headaches that feels that come from her shoulders and neck. Has been worsening recently. Feels very tight and radiates into her jaw. Sometimes her jaw is so sensitive that even talking it feels like things will tense up. Did dry needling in the past which had helped ~3 years ago. Feels it may be worse on the left side of her neck. Has a theracane at home that she uses. Does have a cranio cradle for suboccipitals. Pt does work out in group fitness 5x/wk -- 2 days upper body.  Gets Zepbound  injections which can trigger headaches Hand dominance:  Right  PERTINENT HISTORY:  Chronic neck pain, pelvic floor therapy in the past, history of low back pain  PAIN:  Are you having pain? Yes: NPRS scale: 7-8 tension, currently Pain location: side and posterior neck Pain description: Headaches, tightness, achiness Aggravating factors: AM, end of menstrual cycle Relieving factors: Dry needling  Headache 3/10 Can be different places   PRECAUTIONS: None  RED FLAGS: None   WEIGHT BEARING RESTRICTIONS: No  FALLS:  Has patient fallen in last 6 months? 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  DIAGNOSTIC FINDINGS:  None seen on Epic  PATIENT SURVEYS:  Neck Disability Index: 8 / 50 = 16.0 %  COGNITION: Overall cognitive status: Within functional limits for tasks assessed  SENSATION: WFL  POSTURE: rounded shoulders, forward head, and increased thoracic kyphosis  PALPATION: Most TTP bilat UTs, suboccipitals, semispinalis, masseters  CERVICAL ROM:   Active ROM A/PROM (deg) eval  Flexion 45 stretches bilat  Extension 35 some pain along R neck and into jaw  Right lateral flexion 35 stretch  Left lateral flexion 30 stretch  Right rotation 50  Left rotation 55   (Blank rows = not tested)  UPPER EXTREMITY ROM: WNL throughout  Active ROM Right eval Left eval  Shoulder flexion    Shoulder extension    Shoulder abduction    Shoulder adduction    Shoulder extension    Shoulder internal rotation    Shoulder external rotation    Elbow flexion    Elbow extension    Wrist flexion    Wrist extension    Wrist ulnar deviation    Wrist radial deviation     Wrist pronation    Wrist supination     (Blank rows = not tested)  UPPER EXTREMITY MMT:  MMT Right eval Left eval  Shoulder flexion 5 5  Shoulder extension    Shoulder abduction 5 5  Shoulder adduction    Shoulder extension in prone 3+ pain 3+ pain  Shoulder internal rotation 5 5  Shoulder external rotation 4- with some pain on R 4-  Middle trapezius 4- 3+   (Blank rows = not tested)  CERVICAL SPECIAL TESTS:  Did not assess  FUNCTIONAL TESTS:  Did not assess  TREATMENT DATE:  04/21/24  Therapeutic  Exercise:  Aerobic: Supine:   Prone on elbows for thoracic extension x 2 min;  Prone press ups x 10;  Quadruped:   Seated: Standing: Row Blue TB x 15,  Low row GTB x 15;  Scap squeeze x 10;  Stretches:  Neuromuscular Re-education:  Manual Therapy: manual traction;  Upper trap and levator stretching,  thoracic Pa mobs,  Therapeutic Activity: Self Care:  Trigger Point Dry Needling  Subsequent Treatment: Instructions reviewed, if requested by the patient, prior to subsequent dry needling treatment.   Patient Verbal Consent Given: Yes Education Handout Provided: Previously Provided Muscles Treated: L UT and levator, R UT Electrical Stimulation Performed: No Treatment Response/Outcome: palpable increase in muscle length, twitch response.      Therapeutic Exercise:  Aerobic: Supine:   Quadruped:  SA press with chin tuck x 10;  Seated: Standing: Row Blue TB x 15,  Low row GTB x 15;  bent over row 8lb x 10- education and cueing for mechanics with all.  Stretches:  Neuromuscular Re-education: Manual Therapy: manual traction;  Upper trap and levator stretching, Cervical and thoracic Pa mobs, SOR, STM to R paraspinals.  Therapeutic Activity: Self Care: education     03/24/24: Therapeutic Exercise: review of controlled opening and posture Aerobic: Supine:  SA reaches for shoulder blade Seated: Standing: Stretches:  Neuromuscular Re-education: Manual Therapy:  manual traction;  Upper trap and levator stretching, Cervical Pa mobs, SOR.  Therapeutic Activity: Self Care: education on use of night guard for grinding, and tmj, current symptoms, scoliosis, and posture  Trigger Point Dry Needling  Subsequent Treatment: Instructions reviewed, if requested by the patient, prior to subsequent dry needling treatment.   Patient Verbal Consent Given: Yes Education Handout Provided: Previously Provided Muscles Treated: R mid trap/ med scap border;  Bil UT Electrical Stimulation Performed: No Treatment Response/Outcome: palpable increase in muscle length, twitch response ;     Trigger Point Dry Needling  Initial Treatment: Pt instructed on Dry Needling rational, procedures, and possible side effects. Pt instructed to expect mild to moderate muscle soreness later in the day and/or into the next day.  Pt instructed in methods to reduce muscle soreness. Pt instructed to continue prescribed HEP. Patient was educated on signs and symptoms of infection and other risk factors and advised to seek medical attention should they occur.  Patient verbalized understanding of these instructions and education.   Patient Verbal Consent Given: Yes Education Handout Provided: Yes Muscles Treated: bil masseter, bil lateral pterygoid, bil suboccipitals Electrical Stimulation Performed: No Treatment Response/Outcome: twitch responses, increased soreness noted    02/01/24 See HEP below Self care: Posture and ergonomics of head/shoulder positioning with her work outs; trigger point releases for cervical muscles  PATIENT EDUCATION:  Education details: Exam findings, POC, initial HEP, trigger points, dry needling, tracking headache triggers  Person educated: Patient Education method: Explanation Education comprehension: verbalized understanding, returned demonstration, and needs further education  HOME EXERCISE PROGRAM: Access Code: [email or web address] URL:  [email or web address] Date: 02/01/2024 Prepared by: Gellen April Marie Nonato  Exercises - Standing Cervical Retraction  - 3-5 x daily - 7 x weekly - 2 sets - 10 reps - 3 sec hold - Shoulder External Rotation and Scapular Retraction  - 3-5 x daily - 7 x weekly - 2 sets - 10 reps - Seated Gentle Upper Trapezius Stretch  - 1 x daily - 7 x weekly - 2 sets - 30 sec hold - Sub-Occipital Cervical Stretch  - 1 x daily - 7 x weekly -  2 sets - 30 sec hold  ASSESSMENT:  CLINICAL IMPRESSION: Pt with difficulty with UT/neck tension with UE strengthening. Reviewed posture and mechanics for rows. She has good ability for thoracic extension rom, but fatiguing to hold upright posture in unsupported position. Reviewed optimal seated posture. Repeated dry needling for UT tension today. Headaches still present, but are overall less intense and decreasing in frequency.   Pt with tenderness in temporalis and masseters with palpation today. We reviewed controlled opening for TMJ exercises, as well as recommendation to try the night guard for possible grinding. We discussed scoliosis as being a factor but likely not what is driving her pain. Repeated dry needling today due to + response in the past. She will benefit from continued care for neck and jaw pain, plan to review more postural strengthening next visit.    OBJECTIVE IMPAIRMENTS: decreased activity tolerance, decreased coordination, decreased endurance, decreased mobility, decreased ROM, decreased strength, increased fascial restrictions, increased muscle spasms, improper body mechanics, postural dysfunction, and pain.   ACTIVITY LIMITATIONS: carrying, lifting, toileting, dressing, reach over head, hygiene/grooming, and caring for others  PARTICIPATION LIMITATIONS: driving, community activity, and occupation  PERSONAL FACTORS: Age, Fitness, Past/current experiences, and Time since onset of injury/illness/exacerbation are also affecting  patient's functional outcome.   REHAB POTENTIAL: Good  CLINICAL DECISION MAKING: Evolving/moderate complexity  EVALUATION COMPLEXITY: Moderate   GOALS: Goals reviewed with patient? Yes  SHORT TERM GOALS: Target date: 02/29/2024   Pt will be ind with initial HEP Baseline:  Goal status: MET  2.  Pt will report reduction in pain and headaches by >/=25% Baseline:  Goal status: In progress    LONG TERM GOALS: Target date: 03/28/2024   Pt will be ind with management and progression of HEP Baseline:  Goal status: INITIAL  2.  Pt will have full and pain free cervical ROM Baseline:  Goal status: INITIAL  3.  Pt will demo at least 4/5 posterior shoulder strength for decreased UT/neck muscle compensation with activities Baseline:  Goal status: INITIAL  4.  Pt will report improved pain and headaches by >/=50% Baseline:  Goal status: INITIAL  5.  Pt will have improved NDI to </=6% to demo MCID Baseline: 16% Goal status: INITIAL    PLAN:  PT FREQUENCY: 1x/week  PT DURATION: 8 weeks  PLANNED INTERVENTIONS: 02835- PT Re-evaluation, 97110-Therapeutic exercises, 97530- Therapeutic activity, 97112- Neuromuscular re-education, 97535- Self Care, 02859- Manual therapy, [id number]- Electrical stimulation (unattended), [phone]- Ionotophoresis 4mg /ml Dexamethasone , 79439 (1-2 muscles), 20561 (3+ muscles)- Dry Needling, Patient/Family education, Taping, Joint mobilization, Spinal mobilization, Cryotherapy, and Moist heat  PLAN FOR NEXT SESSION:   Shoulder/postural strength, demo tricep extensions for pain , prone press up for thoracic and lumbar extension(difficulty sitting up straight)    Tinnie Don, PT, DPT 4:02 PM  04/21/24

## 2024-05-03 ENCOUNTER — Encounter: Payer: Self-pay | Admitting: Physical Therapy

## 2024-05-03 ENCOUNTER — Ambulatory Visit: Admitting: Physical Therapy

## 2024-05-03 DIAGNOSIS — M542 Cervicalgia: Secondary | ICD-10-CM | POA: Diagnosis not present

## 2024-05-03 DIAGNOSIS — M6281 Muscle weakness (generalized): Secondary | ICD-10-CM

## 2024-05-03 NOTE — Therapy (Signed)
 OUTPATIENT PHYSICAL THERAPY TREATMENT   Patient Name: Kelly Navarro MRN: [id number] DOB:21-Aug-1985, 38 y.o., female Today's Date: 05/03/2024    END OF SESSION:  PT End of Session - 05/03/24 1706     Visit Number 6    Number of Visits 8    Date for Recertification  06/02/24    Authorization Type [employer]    PT Start Time 0848    PT Stop Time 0936    PT Time Calculation (min) 48 min    Activity Tolerance Patient tolerated treatment well    Behavior During Therapy [facility] for tasks assessed/performed              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   Eczema    Gaucher disease, type I (HCC)    noted on [facility] genetic testing 05/2015   GERD (gastroesophageal reflux disease)    Headache(784.0)    menstrual    IBS (irritable bowel syndrome)    Incomplete miscarriage 02/22/2019   Scoliosis    very mild   Past Surgical History:  Procedure Laterality Date   CESAREAN SECTION N/A 10/21/2016   Procedure: CESAREAN SECTION;  Surgeon: Duwaine Blumenthal, DO;  Location: [facility];  Service: Obstetrics;  Laterality: N/A;   CESAREAN SECTION N/A 03/22/2020   Procedure: CESAREAN SECTION;  Surgeon: Kandyce Sor, MD;  Location: [facility] LD ORS;  Service: Obstetrics;  Laterality: N/A;   DILATION AND EVACUATION N/A 02/22/2019   Procedure: DILATATION AND EVACUATION;  Surgeon: Mat Browning, MD;  Location: [facility] OR;  Service: Gynecology;  Laterality: N/A;   GUM SURGERY     OTHER SURGICAL HISTORY  2020   septate uterus, polyp   WISDOM TOOTH EXTRACTION     Patient Active Problem List   Diagnosis Date Noted   History of spontaneous abortion 07/17/2023   Polyp of endometrium 07/17/2023    Subchorionic hematoma 04/08/2023   Uterus arcuatus 04/08/2023   Human papilloma virus infection 04/08/2023   Thrombophilia 04/08/2023   Menorrhagia 01/27/2023   Vaginal irritation 01/27/2023   Obesity (BMI 30.0-34.9) 05/26/2022   Influenza A (H1N1) 06/05/2021   Mood disorder 11/02/2020   Anxiety 10/08/2020   Thrombophilia affecting pregnancy, antepartum (PAI) 03/25/2020   Postpartum care following cesarean delivery (9/23) 03/23/2020   Placental abruption in third trimester 03/23/2020   History of cesarean section 10/21/2016   Gaucher disease (HCC) 07/10/2015    PCP: Allwardt, Mardy HERO, PA-C  REFERRING PROVIDER: Allwardt, Mardy HERO, PA-C  REFERRING DIAG: M54.2 (ICD-10-CM) - Posterior neck pain  THERAPY DIAG:  Cervicalgia  Muscle weakness (generalized)  Rationale for Evaluation and Treatment: Rehabilitation  ONSET DATE: 10 years, worsening in the past 6 months  SUBJECTIVE:  SUBJECTIVE STATEMENT:  States only 1 headache this week, Feels they have been less often and less intense. Does still feel neck tightness and discomfort.     EVAL: Pt reports headaches that feels that come from her shoulders and neck. Has been worsening recently. Feels very tight and radiates into her jaw. Sometimes her jaw is so sensitive that even talking it feels like things will tense up. Did dry needling in the past which had helped ~3 years ago. Feels it may be worse on the left side of her neck. Has a theracane at home that she uses. Does have a cranio cradle for suboccipitals. Pt does work out in group fitness 5x/wk -- 2 days upper body.  Gets Zepbound  injections which can trigger headaches Hand dominance: Right  PERTINENT HISTORY:  Chronic neck pain, pelvic floor therapy in the past, history of low  back pain  PAIN:  Are you having pain? Yes: NPRS scale: 7-8 tension, currently Pain location: side and posterior neck Pain description: Headaches, tightness, achiness Aggravating factors: AM, end of menstrual cycle Relieving factors: Dry needling  Headache 3/10 Can be different places   PRECAUTIONS: None  RED FLAGS: None   WEIGHT BEARING RESTRICTIONS: No  FALLS:  Has patient fallen in last 6 months? 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  DIAGNOSTIC FINDINGS:  None seen on Epic  PATIENT SURVEYS:  Neck Disability Index: 8 / 50 = 16.0 %  COGNITION: Overall cognitive status: Within functional limits for tasks assessed  SENSATION: WFL  POSTURE: rounded shoulders, forward head, and increased thoracic kyphosis  PALPATION: Most TTP bilat UTs, suboccipitals, semispinalis, masseters  CERVICAL ROM:   Active ROM A/PROM (deg) eval  Flexion 45 stretches bilat  Extension 35 some pain along R neck and into jaw  Right lateral flexion 35 stretch  Left lateral flexion 30 stretch  Right rotation 50  Left rotation 55   (Blank rows = not tested)  UPPER EXTREMITY ROM: WNL throughout  Active ROM Right eval Left eval  Shoulder flexion    Shoulder extension    Shoulder abduction    Shoulder adduction    Shoulder extension    Shoulder internal rotation    Shoulder external rotation    Elbow flexion    Elbow extension    Wrist flexion    Wrist extension    Wrist ulnar deviation    Wrist radial deviation    Wrist pronation    Wrist supination     (Blank rows = not tested)  UPPER EXTREMITY  MMT:  MMT Right eval Left eval  Shoulder flexion 5 5  Shoulder extension    Shoulder abduction 5 5  Shoulder adduction    Shoulder extension in prone 3+ pain 3+ pain  Shoulder internal rotation 5 5  Shoulder external rotation 4- with some pain on R 4-  Middle trapezius 4- 3+   (Blank rows = not tested)  CERVICAL SPECIAL TESTS:  Did not assess  FUNCTIONAL TESTS:  Did not assess  TREATMENT DATE:  05/03/24  Therapeutic Exercise:  Aerobic: Supine:   Quadruped:  cat/cow x 15,  thread needle x5 bil; SA press with  Chin tuck 2 x 8; bird dogs x 15;  High plank 25 sec x 3;  Seated: Standing: Shoulder ER YTB x 15; Shoulder ER at wrists with OH press 2 x 8;  Stretches:  Neuromuscular Re-education:  Manual Therapy: manual traction;  Upper trap and levator stretching,  STM cervical paraspinals, SO, SOR  Therapeutic Activity: Self Care:     Therapeutic Exercise:  Aerobic: Supine:   Quadruped:  SA press with chin tuck x 10;  Seated: Standing: Row Blue TB x 15,  Low row GTB x 15;  bent over row 8lb x 10- education and cueing for mechanics with all.  Stretches:  Neuromuscular Re-education: Manual Therapy: manual traction;  Upper trap and levator stretching, Cervical and thoracic Pa mobs, SOR, STM to R paraspinals.  Therapeutic Activity: Self Care: education     03/24/24: Therapeutic Exercise: review of controlled opening and posture Aerobic: Supine:  SA reaches for shoulder blade Seated: Standing: Stretches:  Neuromuscular Re-education: Manual Therapy: manual traction;  Upper trap and levator stretching, Cervical Pa mobs, SOR.  Therapeutic Activity: Self Care: education on use of night guard for grinding, and tmj, current symptoms, scoliosis, and posture  Trigger Point Dry Needling  Subsequent Treatment: Instructions reviewed, if requested by the patient, prior to subsequent dry needling treatment.   Patient Verbal Consent Given: Yes Education Handout Provided:  Previously Provided Muscles Treated: R mid trap/ med scap border;  Bil UT Electrical Stimulation Performed: No Treatment Response/Outcome: palpable increase in muscle length, twitch response ;     Trigger Point Dry Needling  Initial Treatment: Pt instructed on Dry Needling rational, procedures, and possible side effects. Pt instructed to expect mild to moderate muscle soreness later in the day and/or into the next day.  Pt instructed in methods to reduce muscle soreness. Pt instructed to continue prescribed HEP. Patient was educated on signs and symptoms of infection and other risk factors and advised to seek medical attention should they occur.  Patient verbalized understanding of these instructions and education.   Patient Verbal Consent Given: Yes Education Handout Provided: Yes Muscles Treated: bil masseter, bil lateral pterygoid, bil suboccipitals Electrical Stimulation Performed: No Treatment Response/Outcome: twitch responses, increased soreness noted    02/01/24 See HEP below Self care: Posture and ergonomics of head/shoulder positioning with her work outs; trigger point releases for cervical muscles  PATIENT EDUCATION:  Education details: Exam findings, POC, initial HEP, trigger points, dry needling, tracking headache triggers  Person educated: Patient Education method: Explanation Education comprehension: verbalized understanding, returned demonstration, and needs further education  HOME EXERCISE PROGRAM: Access Code: [email or web address] URL: [email or web address] Date: 02/01/2024 Prepared by: Gellen April Earnie Starring  Exercises - Standing Cervical Retraction  - 3-5 x daily - 7 x weekly - 2 sets - 10 reps - 3 sec hold - Shoulder External Rotation and Scapular Retraction  - 3-5 x daily - 7 x weekly - 2 sets - 10 reps - Seated Gentle Upper Trapezius Stretch  - 1 x daily - 7 x weekly - 2 sets - 30 sec hold - Sub-Occipital Cervical Stretch  - 1 x daily - 7 x weekly  - 2 sets - 30 sec hold  ASSESSMENT:  CLINICAL IMPRESSION: Pt challenged with scapular and postural strengthening, with increased muscle fatigue. Tolerating manual well, and will benefit from continuation of this. She is having improved intensity of headaches. Recommended continued use of night guard for TMJ pain.   Pt with tenderness in temporalis and masseters with palpation today. We reviewed  controlled opening for TMJ exercises, as well as recommendation to try the night guard for possible grinding. We discussed scoliosis as being a factor but likely not what is driving her pain. Repeated dry needling today due to + response in the past. She will benefit from continued care for neck and jaw pain, plan to review more postural strengthening next visit.    OBJECTIVE IMPAIRMENTS: decreased activity tolerance, decreased coordination, decreased endurance, decreased mobility, decreased ROM, decreased strength, increased fascial restrictions, increased muscle spasms, improper body mechanics, postural dysfunction, and pain.   ACTIVITY LIMITATIONS: carrying, lifting, toileting, dressing, reach over head, hygiene/grooming, and caring for others  PARTICIPATION LIMITATIONS: driving, community activity, and occupation  PERSONAL FACTORS: Age, Fitness, Past/current experiences, and Time since onset of injury/illness/exacerbation are also affecting patient's functional outcome.   REHAB POTENTIAL: Good  CLINICAL DECISION MAKING: Evolving/moderate complexity  EVALUATION COMPLEXITY: Moderate   GOALS: Goals reviewed with patient? Yes  SHORT TERM GOALS: Target date: 02/29/2024   Pt will be ind with initial HEP Baseline:  Goal status: MET  2.  Pt will report reduction in pain and headaches by >/=25% Baseline:  Goal status: In progress    LONG TERM GOALS: Target date: 03/28/2024   Pt will be ind with management and progression of HEP Baseline:  Goal status: INITIAL  2.  Pt will have full and  pain free cervical ROM Baseline:  Goal status: INITIAL  3.  Pt will demo at least 4/5 posterior shoulder strength for decreased UT/neck muscle compensation with activities Baseline:  Goal status: INITIAL  4.  Pt will report improved pain and headaches by >/=50% Baseline:  Goal status: INITIAL  5.  Pt will have improved NDI to </=6% to demo MCID Baseline: 16% Goal status: INITIAL    PLAN:  PT FREQUENCY: 1x/week  PT DURATION: 8 weeks  PLANNED INTERVENTIONS: 02835- PT Re-evaluation, 97110-Therapeutic exercises, 97530- Therapeutic activity, 97112- Neuromuscular re-education, 97535- Self Care, 02859- Manual therapy, G0283- Electrical stimulation (unattended), [phone]- Ionotophoresis 4mg /ml Dexamethasone , 79439 (1-2 muscles), 20561 (3+ muscles)- Dry Needling, Patient/Family education, Taping, Joint mobilization, Spinal mobilization, Cryotherapy, and Moist heat  PLAN FOR NEXT SESSION:   Shoulder/postural strength, demo tricep extensions for pain , prone press up for thoracic and lumbar extension(difficulty sitting up straight) , quadruped strengthening, push ups, high plank positions, comfortable row    Tinnie Don, PT, DPT 5:06 PM  05/03/24

## 2024-05-06 ENCOUNTER — Encounter (HOSPITAL_BASED_OUTPATIENT_CLINIC_OR_DEPARTMENT_OTHER): Payer: Self-pay

## 2024-05-06 ENCOUNTER — Emergency Department (HOSPITAL_BASED_OUTPATIENT_CLINIC_OR_DEPARTMENT_OTHER)
Admission: EM | Admit: 2024-05-06 | Discharge: 2024-05-06 | Disposition: A | Discharge status: Home / Self Care | Attending: Emergency Medicine | Admitting: Emergency Medicine

## 2024-05-06 ENCOUNTER — Other Ambulatory Visit: Payer: Self-pay

## 2024-05-06 DIAGNOSIS — R197 Diarrhea, unspecified: Secondary | ICD-10-CM | POA: Diagnosis not present

## 2024-05-06 DIAGNOSIS — R112 Nausea with vomiting, unspecified: Secondary | ICD-10-CM | POA: Insufficient documentation

## 2024-05-06 DIAGNOSIS — R1013 Epigastric pain: Secondary | ICD-10-CM | POA: Insufficient documentation

## 2024-05-06 LAB — CBC
HCT: 42.6 % (ref 36.0–46.0)
Hemoglobin: 14.2 g/dL (ref 12.0–15.0)
MCH: 28.6 pg (ref 26.0–34.0)
MCHC: 33.3 g/dL (ref 30.0–36.0)
MCV: 85.9 fL (ref 80.0–100.0)
Platelets: 338 K/uL (ref 150–400)
RBC: 4.96 MIL/uL (ref 3.87–5.11)
RDW: 13 % (ref 11.5–15.5)
WBC: 19.4 K/uL — ABNORMAL HIGH (ref 4.0–10.5)
nRBC: 0 % (ref 0.0–0.2)

## 2024-05-06 LAB — COMPREHENSIVE METABOLIC PANEL WITH GFR
ALT: 16 U/L (ref 0–44)
AST: 19 U/L (ref 15–41)
Albumin: 5.2 g/dL — ABNORMAL HIGH (ref 3.5–5.0)
Alkaline Phosphatase: 72 U/L (ref 38–126)
Anion gap: 16 — ABNORMAL HIGH (ref 5–15)
BUN: 14 mg/dL (ref 6–20)
CO2: 23 mmol/L (ref 22–32)
Calcium: 10.8 mg/dL — ABNORMAL HIGH (ref 8.9–10.3)
Chloride: 99 mmol/L (ref 98–111)
Creatinine, Ser: 0.93 mg/dL (ref 0.44–1.00)
GFR, Estimated: >60 mL/min (ref >60)
Glucose, Bld: 141 mg/dL — ABNORMAL HIGH (ref 70–99)
Potassium: 3.7 mmol/L (ref 3.5–5.1)
Sodium: 137 mmol/L (ref 135–145)
Total Bilirubin: 0.8 mg/dL (ref 0.0–1.2)
Total Protein: 8.9 g/dL — ABNORMAL HIGH (ref 6.5–8.1)

## 2024-05-06 LAB — URINALYSIS, ROUTINE W REFLEX MICROSCOPIC
Bilirubin Urine: NEGATIVE
Glucose, UA: NEGATIVE mg/dL
Hgb urine dipstick: NEGATIVE
Ketones, ur: 40 mg/dL — AB
Nitrite: NEGATIVE
Protein, ur: 30 mg/dL — AB
Specific Gravity, Urine: 1.028 (ref 1.005–1.030)
pH: 6 (ref 5.0–8.0)

## 2024-05-06 LAB — PREGNANCY, URINE: Preg Test, Ur: NEGATIVE

## 2024-05-06 LAB — LIPASE, BLOOD: Lipase: 27 U/L (ref 11–51)

## 2024-05-06 MED ORDER — ONDANSETRON 4 MG PO TBDP: 4.0000 mg | ORAL_TABLET | Freq: Three times a day (TID) | ORAL | 0 refills | Status: AC | PRN

## 2024-05-06 MED ORDER — LACTATED RINGERS IV BOLUS
1000.0000 mL | Freq: Once | INTRAVENOUS | Status: AC
  Administered 2024-05-06: 1000 mL via INTRAVENOUS

## 2024-05-06 MED ORDER — METOCLOPRAMIDE HCL 5 MG/ML IJ SOLN
10.0000 mg | Freq: Once | INTRAMUSCULAR | Status: AC
  Administered 2024-05-06: 10 mg via INTRAVENOUS
  Filled 2024-05-06: qty 2

## 2024-05-06 MED ORDER — ONDANSETRON HCL 4 MG/2ML IJ SOLN
4.0000 mg | Freq: Once | INTRAMUSCULAR | Status: DC
  Filled 2024-05-06: qty 2

## 2024-05-06 NOTE — ED Triage Notes (Signed)
 Patient reports vomiting starting 3 hours ago. She is nauseous and lightheaded as well. She reports being on Zepbound  but says she actually went down on her dose so she is unaware if that caused it. She said last week she had constipation. No hx of abdominal surgeries or issues in the past.

## 2024-05-06 NOTE — ED Provider Notes (Signed)
 [place] EMERGENCY DEPARTMENT AT [facility] Provider Note   CSN: [id number] Arrival date & time: 05/06/24  9273     Patient presents with: Abdominal Pain   Kelly Navarro is a 38 y.o. female.   Patient is a 38 year old female with a history of IBS, GERD, Gaucher's disease who is presenting today with sudden onset of nausea vomiting that started around 2 or 3 in the morning.  She woke up with a stomachache and reports she had a fairly normal bowel movement and then a little bit of diarrhea.  Shortly after she started vomiting and reports she has been vomiting every 15 minutes for the last 3 hours.  Now she is mostly just dry heaving.  She has sensation of continued nausea and feels like she cannot get all the emesis up.  Having pressure in her epigastric area and some burning in her chest after all the vomiting.  No hematemesis.  Urine has been normal.  She reports yesterday was a completely normal day and denies history concerning for bad food exposure and no recent travel.  Patient is on Zepbound  and reports last week she did have a brief episode of abdominal pain with 1 episode of vomiting but that had overall resolved.  Her dose of Zepbound  has not recently changed.  She has had no fever, cough or congestion recently.  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  She tried taking Zofran  at home but does not think it stayed down.  The history is provided by the patient and medical records.  Abdominal Pain      Prior to Admission medications   Medication Sig Start Date End Date Taking? Authorizing Provider  ondansetron  (ZOFRAN -ODT) 4 MG disintegrating tablet Take 1 tablet (4 mg total) by mouth every 8 (eight) hours as needed for nausea or vomiting. 05/06/24  Yes Zivah Mayr, Benton, MD  ALPRAZolam  (XANAX ) 0.5 MG tablet TAKE 1/2 TO 1 TABLET BY MOUTH AT BEDTIME AS NEEDED FOR ANXIETY 06/20/23   Franchot Harlene SQUIBB, PMHNP  buPROPion  (WELLBUTRIN XL) 150 MG 24 hr tablet Take 150 mg by mouth daily.    [provider]  busPIRone  (BUSPAR ) 30 MG tablet Take 1 tablet (30 mg total) by mouth 2 (two) times daily. 06/18/23   Franchot Harlene SQUIBB, PMHNP  cloNIDine (CATAPRES) 0.1 MG tablet Take 0.1 mg by mouth as needed (Once daily as needed).    [provider]  cyanocobalamin  (VITAMIN B12) 1000 MCG tablet Take 1,000 mcg by mouth daily.    [provider]  ferrous sulfate  325 (65 FE) MG tablet Take 325 mg by mouth daily with breakfast.    [provider]  hydrocortisone  2.5 % cream Apply topically 2 (two) times daily. 03/25/24   Allwardt, Harlea M, PA-C  hydrOXYzine  (ATARAX ) 25 MG tablet Take 1 tablet (25 mg total) by mouth 3 (three) times daily as needed for anxiety. 06/18/23   Franchot Harlene SQUIBB, PMHNP  ondansetron  (ZOFRAN ) 4 MG tablet Take 1 tablet (4 mg total) by mouth every 8 (eight) hours as needed for nausea or vomiting. 03/07/24   Allwardt, Asmi M, PA-C  sertraline (ZOLOFT) 100 MG tablet Take 100 mg by mouth daily.    [provider]  tirzepatide  (ZEPBOUND ) 7.5 MG/0.5ML injection vial INJECT 0.5 ML (7.5 MG) UNDER THE SKIN ONCE WEEKLY (0.5ML= 50 UNITS) 02/23/24   Allwardt, Mardy HERO, PA-C  tranexamic acid (LYSTEDA) 650 MG TABS tablet  05/27/23  [provider]    Allergies: Amoxicillin, Bee venom, Macrodantin  [nitrofurantoin  macrocrystal], Nitrofurantoin , Penicillins, Pineapple, and Sulfa antibiotics    Review of Systems  Gastrointestinal:  Positive for abdominal pain.    Updated Vital Signs BP 104/76   Pulse 78   Temp 98 F (36.7 C)   Resp 18   SpO2 99%   Physical Exam Vitals and nursing note reviewed.  Constitutional:      General: She is not in acute distress.    Appearance: She is well-developed.  HENT:     Head: Normocephalic and atraumatic.     Mouth/Throat:     Mouth: Mucous membranes are dry.  Eyes:     Pupils: Pupils are equal, round, and reactive to light.   Cardiovascular:     Rate and Rhythm: Normal rate and regular rhythm.     Heart sounds: Normal heart sounds. No murmur heard.    No friction rub.  Pulmonary:     Effort: Pulmonary effort is normal.     Breath sounds: Normal breath sounds. No wheezing or rales.  Abdominal:     General: Bowel sounds are normal. There is no distension.     Palpations: Abdomen is soft.     Tenderness: There is abdominal tenderness in the epigastric area. There is no guarding or rebound.     Comments: Minimal epigastric tenderness and intermittently retching on exam  Musculoskeletal:        General: No tenderness. Normal range of motion.     Comments: No edema  Skin:    General: Skin is warm and dry.     Coloration: Skin is pale.     Findings: No rash.  Neurological:     Mental Status: She is alert and oriented to person, place, and time.     Cranial Nerves: No cranial nerve deficit.  Psychiatric:        Behavior: Behavior normal.     (all labs ordered are listed, but only abnormal results are displayed) Labs Reviewed  COMPREHENSIVE METABOLIC PANEL WITH GFR - Abnormal; Notable for the following components:      Result Value   Glucose, Bld 141 (*)    Calcium 10.8 (*)    Total Protein 8.9 (*)    Albumin 5.2 (*)    Anion gap 16 (*)    All other components within normal limits  CBC - Abnormal; Notable for the following components:   WBC 19.4 (*)    All other components within normal limits  URINALYSIS, ROUTINE W REFLEX MICROSCOPIC - Abnormal; Notable for the following components:   Ketones, ur 40 (*)    Protein, ur 30 (*)    Leukocytes,Ua SMALL (*)    Bacteria, UA RARE (*)    All other components within normal limits  LIPASE, BLOOD  PREGNANCY, URINE    EKG: None  Radiology: No results found.   Procedures   Medications Ordered in the ED  metoCLOPramide (REGLAN) injection 10 mg (10 mg Intravenous Given 05/06/24 0746)  lactated ringers  bolus 1,000 mL (0 mLs Intravenous Stopped 05/06/24  9076)                                    Medical Decision Making Amount and/or Complexity of Data Reviewed Labs: ordered. Decision-making details documented in ED Course.  Risk Prescription drug management.   Pt presenting today with a complaint that caries a high risk for  morbidity and mortality. Here today with the above complaints.  Concern for medication reaction, viral etiology, pancreatitis, gastritis, hepatitis.  No findings concerning for appendicitis, renal stone, pyelonephritis or diverticulitis.  Low suspicion for perforation at this time.  Low suspicion for acute cardiac conditions.  Will give nausea control and fluids.  Labs are pending. I dependently interpreted patient's labs and CBC, lipase within normal limits.  Anion gap minimally elevated at 16 related to dehydration from the vomiting, CBC with a leukocytosis of 19 most likely acute phase reaction for all the retching and vomiting, UA with some ketones but otherwise unremarkable.  Pregnancy test was negative.  On repeat evaluation patient is feeling much better after Reglan and fluids.  P.o. challenged and felt good and would like to be discharged home.     Final diagnoses:  Nausea vomiting and diarrhea    ED Discharge Orders          Ordered    ondansetron  (ZOFRAN -ODT) 4 MG disintegrating tablet  Every 8 hours PRN        05/06/24 1019               Doretha Folks, MD 05/06/24 1020

## 2024-05-06 NOTE — Discharge Instructions (Signed)
 Stick with a very bland diet today.  Other than your white blood cell count being elevated everything else was normal.  Use the Zofran  as needed.

## 2024-05-10 DIAGNOSIS — F4312 Post-traumatic stress disorder, chronic: Secondary | ICD-10-CM | POA: Diagnosis not present

## 2024-05-10 DIAGNOSIS — F3340 Major depressive disorder, recurrent, in remission, unspecified: Secondary | ICD-10-CM | POA: Diagnosis not present

## 2024-05-10 DIAGNOSIS — F410 Panic disorder [episodic paroxysmal anxiety] without agoraphobia: Secondary | ICD-10-CM | POA: Diagnosis not present

## 2024-05-17 ENCOUNTER — Other Ambulatory Visit: Payer: Self-pay | Admitting: Physician Assistant

## 2024-05-19 ENCOUNTER — Encounter: Admitting: Physical Therapy

## 2024-05-23 ENCOUNTER — Other Ambulatory Visit: Payer: Self-pay | Admitting: Physician Assistant

## 2024-05-23 DIAGNOSIS — L309 Dermatitis, unspecified: Secondary | ICD-10-CM

## 2024-06-15 ENCOUNTER — Encounter: Payer: Self-pay | Admitting: Physician Assistant

## 2024-06-15 NOTE — Telephone Encounter (Signed)
 Any info on this or if we accept patient's new insurance coverage for 2026

## 2024-06-16 NOTE — Telephone Encounter (Signed)
 Please see patient msg and advise if our office accepts this insurance coverage for patient

## 2024-06-16 NOTE — Telephone Encounter (Signed)
 LVM for patient to check with their insurance re: the plan they have chosen as to whether we are in network with that plan.

## 2024-06-21 NOTE — Telephone Encounter (Signed)
 Left patient vm that our office has historically not been in network with [employer].  However, she can reach out to customer service with [employer] or possibly their website to locate which practices in her area are in network for 2026.

## 2024-07-05 ENCOUNTER — Telehealth: Payer: Self-pay | Admitting: Physician Assistant

## 2024-07-05 ENCOUNTER — Encounter: Payer: Self-pay | Admitting: Physician Assistant

## 2024-07-05 NOTE — Telephone Encounter (Signed)
 Please see pt msg and advise

## 2024-07-05 NOTE — Telephone Encounter (Signed)
 Please see provider message regarding appt

## 2024-07-05 NOTE — Telephone Encounter (Signed)
 LVM for Patient to overbook schedule for 07/05/24 at 3pm or 07/06/24 at 11am as a Virtual. Please Transfer to front desk.

## 2024-07-05 NOTE — Telephone Encounter (Signed)
 LVM for second time for Patient to overbook schedule for 07/06/24 at 11am as a Virtual. Please Transfer to front desk.

## 2024-07-06 ENCOUNTER — Telehealth: Payer: Self-pay

## 2024-07-06 NOTE — Telephone Encounter (Signed)
 Copied from CRM [id number]. Topic: General - Other >> Jul 05, 2024  5:05 PM Delon DASEN wrote: Reason for CRM: Patient no longer needs appt, went to urgent care.  Noted, no appt needed at this time

## 2024-07-19 ENCOUNTER — Encounter: Admitting: Physician Assistant

## 2024-07-19 ENCOUNTER — Other Ambulatory Visit: Payer: Self-pay | Admitting: Physician Assistant

## 2024-07-19 ENCOUNTER — Other Ambulatory Visit: Payer: Self-pay

## 2024-07-19 ENCOUNTER — Telehealth: Payer: Self-pay

## 2024-07-19 NOTE — Telephone Encounter (Signed)
 Called pt in regards to recent Rx refill request received and needing to confirm dose she is taking of Zepbound . Advised pt to return my call in office with cb number
